# Patient Record
Sex: Female | Born: 1935 | Race: White | Hispanic: No | Smoking: Never smoker
Health system: Southern US, Community
[De-identification: ages and names within clinical notes are randomized; demographics above are authoritative.]

## PROBLEM LIST (undated history)

## (undated) DIAGNOSIS — Z860100 Personal history of colon polyps, unspecified: Secondary | ICD-10-CM

## (undated) DIAGNOSIS — H9190 Unspecified hearing loss, unspecified ear: Secondary | ICD-10-CM

## (undated) DIAGNOSIS — K2270 Barrett's esophagus without dysplasia: Secondary | ICD-10-CM

## (undated) DIAGNOSIS — K5792 Diverticulitis of intestine, part unspecified, without perforation or abscess without bleeding: Secondary | ICD-10-CM

## (undated) DIAGNOSIS — F419 Anxiety disorder, unspecified: Secondary | ICD-10-CM

## (undated) DIAGNOSIS — N390 Urinary tract infection, site not specified: Secondary | ICD-10-CM

## (undated) DIAGNOSIS — E785 Hyperlipidemia, unspecified: Secondary | ICD-10-CM

## (undated) DIAGNOSIS — E039 Hypothyroidism, unspecified: Secondary | ICD-10-CM

## (undated) DIAGNOSIS — M810 Age-related osteoporosis without current pathological fracture: Secondary | ICD-10-CM

## (undated) DIAGNOSIS — C4491 Basal cell carcinoma of skin, unspecified: Secondary | ICD-10-CM

## (undated) DIAGNOSIS — H532 Diplopia: Secondary | ICD-10-CM

## (undated) DIAGNOSIS — I10 Essential (primary) hypertension: Secondary | ICD-10-CM

## (undated) DIAGNOSIS — K449 Diaphragmatic hernia without obstruction or gangrene: Secondary | ICD-10-CM

## (undated) DIAGNOSIS — C73 Malignant neoplasm of thyroid gland: Secondary | ICD-10-CM

## (undated) HISTORY — DX: Anxiety disorder, unspecified: F41.9

## (undated) HISTORY — PX: CHOLECYSTECTOMY: SHX55

## (undated) HISTORY — DX: Hyperlipidemia, unspecified: E78.5

## (undated) HISTORY — DX: Diplopia: H53.2

## (undated) HISTORY — DX: Urinary tract infection, site not specified: N39.0

## (undated) HISTORY — DX: Diaphragmatic hernia without obstruction or gangrene: K44.9

## (undated) HISTORY — PX: ABDOMINAL HYSTERECTOMY: SHX81

## (undated) HISTORY — DX: Personal history of colonic polyps: Z86.010

## (undated) HISTORY — DX: Barrett's esophagus without dysplasia: K22.70

## (undated) HISTORY — DX: Diverticulitis of intestine, part unspecified, without perforation or abscess without bleeding: K57.92

## (undated) HISTORY — DX: Age-related osteoporosis without current pathological fracture: M81.0

## (undated) HISTORY — DX: Basal cell carcinoma of skin, unspecified: C44.91

## (undated) HISTORY — DX: Personal history of colon polyps, unspecified: Z86.0100

## (undated) HISTORY — DX: Hypothyroidism, unspecified: E03.9

## (undated) HISTORY — DX: Malignant neoplasm of thyroid gland: C73

## (undated) HISTORY — DX: Essential (primary) hypertension: I10

## (undated) HISTORY — PX: SKIN CANCER EXCISION: SHX779

---

## 1993-08-27 HISTORY — PX: HIP SURGERY: SHX245

## 1994-01-25 DIAGNOSIS — C73 Malignant neoplasm of thyroid gland: Secondary | ICD-10-CM

## 1994-01-25 HISTORY — DX: Malignant neoplasm of thyroid gland: C73

## 1994-02-25 DIAGNOSIS — Z85850 Personal history of malignant neoplasm of thyroid: Secondary | ICD-10-CM | POA: Insufficient documentation

## 1994-04-27 HISTORY — PX: THYROIDECTOMY: SHX17

## 1998-05-27 DIAGNOSIS — F329 Major depressive disorder, single episode, unspecified: Secondary | ICD-10-CM

## 1998-05-27 DIAGNOSIS — F32A Depression, unspecified: Secondary | ICD-10-CM | POA: Insufficient documentation

## 1998-06-08 ENCOUNTER — Ambulatory Visit (HOSPITAL_COMMUNITY): Admission: RE | Admit: 1998-06-08 | Discharge: 1998-06-08 | Payer: Self-pay | Admitting: Gastroenterology

## 1998-10-07 ENCOUNTER — Encounter: Payer: Self-pay | Admitting: Gastroenterology

## 1998-10-07 ENCOUNTER — Ambulatory Visit (HOSPITAL_COMMUNITY): Admission: RE | Admit: 1998-10-07 | Discharge: 1998-10-07 | Payer: Self-pay | Admitting: Gastroenterology

## 1998-10-27 ENCOUNTER — Ambulatory Visit (HOSPITAL_COMMUNITY): Admission: RE | Admit: 1998-10-27 | Discharge: 1998-10-27 | Payer: Self-pay | Admitting: Gastroenterology

## 2001-04-12 ENCOUNTER — Other Ambulatory Visit: Admission: RE | Admit: 2001-04-12 | Discharge: 2001-04-12 | Payer: Self-pay | Admitting: Family Medicine

## 2001-04-18 ENCOUNTER — Encounter: Admission: RE | Admit: 2001-04-18 | Discharge: 2001-04-18 | Payer: Self-pay | Admitting: Family Medicine

## 2001-04-18 ENCOUNTER — Encounter: Payer: Self-pay | Admitting: Family Medicine

## 2003-09-02 LAB — FECAL OCCULT BLOOD, GUAIAC: Fecal Occult Blood: NEGATIVE

## 2005-02-16 ENCOUNTER — Ambulatory Visit: Payer: Self-pay | Admitting: Family Medicine

## 2005-04-26 ENCOUNTER — Ambulatory Visit: Payer: Self-pay | Admitting: Family Medicine

## 2005-05-11 ENCOUNTER — Ambulatory Visit: Payer: Self-pay | Admitting: Family Medicine

## 2005-06-01 ENCOUNTER — Ambulatory Visit: Payer: Self-pay | Admitting: Family Medicine

## 2005-07-25 ENCOUNTER — Ambulatory Visit: Payer: Self-pay | Admitting: Family Medicine

## 2005-07-27 ENCOUNTER — Ambulatory Visit: Payer: Self-pay | Admitting: Family Medicine

## 2005-09-22 ENCOUNTER — Ambulatory Visit: Payer: Self-pay | Admitting: Family Medicine

## 2005-11-01 ENCOUNTER — Ambulatory Visit: Payer: Self-pay | Admitting: Gastroenterology

## 2005-11-14 ENCOUNTER — Ambulatory Visit: Payer: Self-pay | Admitting: Gastroenterology

## 2005-12-06 ENCOUNTER — Ambulatory Visit: Payer: Self-pay | Admitting: Gastroenterology

## 2005-12-06 ENCOUNTER — Ambulatory Visit: Payer: Self-pay | Admitting: Family Medicine

## 2005-12-18 ENCOUNTER — Ambulatory Visit: Payer: Self-pay | Admitting: Family Medicine

## 2005-12-20 ENCOUNTER — Encounter: Payer: Self-pay | Admitting: Orthopedic Surgery

## 2006-01-25 ENCOUNTER — Ambulatory Visit: Payer: Self-pay | Admitting: Family Medicine

## 2006-02-09 ENCOUNTER — Ambulatory Visit: Payer: Self-pay | Admitting: Psychiatry

## 2006-04-24 ENCOUNTER — Ambulatory Visit: Payer: Self-pay | Admitting: Family Medicine

## 2006-05-09 ENCOUNTER — Ambulatory Visit: Payer: Self-pay | Admitting: Family Medicine

## 2006-05-15 ENCOUNTER — Ambulatory Visit: Payer: Self-pay | Admitting: Gastroenterology

## 2006-05-28 ENCOUNTER — Ambulatory Visit: Payer: Self-pay | Admitting: Gastroenterology

## 2006-05-28 ENCOUNTER — Encounter: Payer: Self-pay | Admitting: Gastroenterology

## 2006-06-14 ENCOUNTER — Ambulatory Visit: Payer: Self-pay | Admitting: Family Medicine

## 2006-07-10 ENCOUNTER — Ambulatory Visit: Payer: Self-pay | Admitting: Gastroenterology

## 2006-08-21 ENCOUNTER — Ambulatory Visit: Payer: Self-pay | Admitting: Family Medicine

## 2006-08-21 LAB — CONVERTED CEMR LAB
Blood Glucose, Fasting: 94 mg/dL
TSH: 1.56 microintl units/mL

## 2006-08-23 ENCOUNTER — Other Ambulatory Visit: Admission: RE | Admit: 2006-08-23 | Discharge: 2006-08-23 | Payer: Self-pay | Admitting: Family Medicine

## 2006-08-23 ENCOUNTER — Ambulatory Visit: Payer: Self-pay | Admitting: Family Medicine

## 2006-08-23 ENCOUNTER — Encounter: Payer: Self-pay | Admitting: Family Medicine

## 2006-08-23 LAB — CONVERTED CEMR LAB: Pap Smear: NORMAL

## 2006-08-28 ENCOUNTER — Ambulatory Visit: Payer: Self-pay | Admitting: Family Medicine

## 2006-12-11 ENCOUNTER — Ambulatory Visit: Payer: Self-pay | Admitting: Urology

## 2006-12-11 ENCOUNTER — Other Ambulatory Visit: Payer: Self-pay

## 2006-12-17 ENCOUNTER — Ambulatory Visit: Payer: Self-pay | Admitting: Urology

## 2007-02-15 ENCOUNTER — Encounter: Payer: Self-pay | Admitting: Family Medicine

## 2007-02-15 DIAGNOSIS — I059 Rheumatic mitral valve disease, unspecified: Secondary | ICD-10-CM | POA: Insufficient documentation

## 2007-02-15 DIAGNOSIS — E039 Hypothyroidism, unspecified: Secondary | ICD-10-CM | POA: Insufficient documentation

## 2007-02-15 DIAGNOSIS — J309 Allergic rhinitis, unspecified: Secondary | ICD-10-CM | POA: Insufficient documentation

## 2007-02-15 DIAGNOSIS — N951 Menopausal and female climacteric states: Secondary | ICD-10-CM | POA: Insufficient documentation

## 2007-02-15 DIAGNOSIS — Z860100 Personal history of colon polyps, unspecified: Secondary | ICD-10-CM | POA: Insufficient documentation

## 2007-02-15 DIAGNOSIS — K589 Irritable bowel syndrome without diarrhea: Secondary | ICD-10-CM | POA: Insufficient documentation

## 2007-02-15 DIAGNOSIS — J329 Chronic sinusitis, unspecified: Secondary | ICD-10-CM | POA: Insufficient documentation

## 2007-02-15 DIAGNOSIS — R630 Anorexia: Secondary | ICD-10-CM | POA: Insufficient documentation

## 2007-02-15 DIAGNOSIS — K5730 Diverticulosis of large intestine without perforation or abscess without bleeding: Secondary | ICD-10-CM | POA: Insufficient documentation

## 2007-02-15 DIAGNOSIS — I10 Essential (primary) hypertension: Secondary | ICD-10-CM | POA: Insufficient documentation

## 2007-02-15 DIAGNOSIS — I4891 Unspecified atrial fibrillation: Secondary | ICD-10-CM | POA: Insufficient documentation

## 2007-02-15 DIAGNOSIS — E785 Hyperlipidemia, unspecified: Secondary | ICD-10-CM | POA: Insufficient documentation

## 2007-04-29 ENCOUNTER — Encounter: Payer: Self-pay | Admitting: Family Medicine

## 2007-05-15 ENCOUNTER — Ambulatory Visit: Payer: Self-pay | Admitting: Family Medicine

## 2007-05-15 DIAGNOSIS — M549 Dorsalgia, unspecified: Secondary | ICD-10-CM | POA: Insufficient documentation

## 2007-05-15 LAB — CONVERTED CEMR LAB
Bilirubin Urine: NEGATIVE
Glucose, Urine, Semiquant: NEGATIVE
Ketones, urine, test strip: NEGATIVE
Nitrite: NEGATIVE
Protein, U semiquant: NEGATIVE
Specific Gravity, Urine: <1.005
Urobilinogen, UA: 0.2
pH: 6

## 2007-05-16 ENCOUNTER — Telehealth: Payer: Self-pay | Admitting: Family Medicine

## 2007-06-11 ENCOUNTER — Encounter (INDEPENDENT_AMBULATORY_CARE_PROVIDER_SITE_OTHER): Payer: Self-pay | Admitting: Urology

## 2007-06-11 ENCOUNTER — Ambulatory Visit (HOSPITAL_BASED_OUTPATIENT_CLINIC_OR_DEPARTMENT_OTHER): Admission: RE | Admit: 2007-06-11 | Discharge: 2007-06-11 | Payer: Self-pay | Admitting: Urology

## 2007-09-03 ENCOUNTER — Encounter: Payer: Self-pay | Admitting: Family Medicine

## 2007-09-20 ENCOUNTER — Ambulatory Visit: Payer: Self-pay | Admitting: Family Medicine

## 2007-11-03 ENCOUNTER — Emergency Department: Payer: Self-pay | Admitting: Emergency Medicine

## 2007-11-03 ENCOUNTER — Encounter: Payer: Self-pay | Admitting: Family Medicine

## 2007-11-03 DIAGNOSIS — R1013 Epigastric pain: Secondary | ICD-10-CM | POA: Insufficient documentation

## 2007-11-18 ENCOUNTER — Ambulatory Visit: Payer: Self-pay | Admitting: Family Medicine

## 2007-11-18 DIAGNOSIS — D485 Neoplasm of uncertain behavior of skin: Secondary | ICD-10-CM | POA: Insufficient documentation

## 2007-12-04 ENCOUNTER — Encounter: Payer: Self-pay | Admitting: Family Medicine

## 2007-12-06 ENCOUNTER — Encounter: Payer: Self-pay | Admitting: Family Medicine

## 2008-09-07 ENCOUNTER — Ambulatory Visit: Payer: Self-pay | Admitting: Family Medicine

## 2008-09-09 ENCOUNTER — Ambulatory Visit (HOSPITAL_COMMUNITY): Admission: RE | Admit: 2008-09-09 | Discharge: 2008-09-09 | Payer: Self-pay | Admitting: Family Medicine

## 2008-09-09 LAB — HM MAMMOGRAPHY: HM Mammogram: NORMAL

## 2008-09-16 ENCOUNTER — Encounter (INDEPENDENT_AMBULATORY_CARE_PROVIDER_SITE_OTHER): Payer: Self-pay | Admitting: *Deleted

## 2008-10-30 ENCOUNTER — Telehealth (INDEPENDENT_AMBULATORY_CARE_PROVIDER_SITE_OTHER): Payer: Self-pay | Admitting: *Deleted

## 2008-12-04 ENCOUNTER — Telehealth: Payer: Self-pay | Admitting: Family Medicine

## 2009-01-05 ENCOUNTER — Ambulatory Visit: Payer: Self-pay | Admitting: Family Medicine

## 2009-01-13 ENCOUNTER — Telehealth: Payer: Self-pay | Admitting: Family Medicine

## 2009-01-14 ENCOUNTER — Ambulatory Visit: Payer: Self-pay | Admitting: Family Medicine

## 2009-01-18 ENCOUNTER — Encounter: Payer: Self-pay | Admitting: Family Medicine

## 2009-01-18 ENCOUNTER — Emergency Department (HOSPITAL_COMMUNITY): Admission: EM | Admit: 2009-01-18 | Discharge: 2009-01-18 | Payer: Self-pay | Admitting: Emergency Medicine

## 2009-01-19 ENCOUNTER — Ambulatory Visit: Payer: Self-pay | Admitting: Family Medicine

## 2009-01-19 DIAGNOSIS — K219 Gastro-esophageal reflux disease without esophagitis: Secondary | ICD-10-CM | POA: Insufficient documentation

## 2009-01-22 ENCOUNTER — Encounter: Payer: Self-pay | Admitting: Internal Medicine

## 2009-01-22 ENCOUNTER — Observation Stay (HOSPITAL_COMMUNITY): Admission: EM | Admit: 2009-01-22 | Discharge: 2009-01-23 | Payer: Self-pay | Admitting: Emergency Medicine

## 2009-01-22 ENCOUNTER — Encounter: Payer: Self-pay | Admitting: Family Medicine

## 2009-01-22 ENCOUNTER — Ambulatory Visit: Payer: Self-pay | Admitting: Internal Medicine

## 2009-01-22 DIAGNOSIS — R51 Headache: Secondary | ICD-10-CM | POA: Insufficient documentation

## 2009-01-22 DIAGNOSIS — R519 Headache, unspecified: Secondary | ICD-10-CM | POA: Insufficient documentation

## 2009-01-22 DIAGNOSIS — H519 Unspecified disorder of binocular movement: Secondary | ICD-10-CM | POA: Insufficient documentation

## 2009-01-28 ENCOUNTER — Telehealth: Payer: Self-pay | Admitting: Internal Medicine

## 2009-01-29 ENCOUNTER — Ambulatory Visit: Payer: Self-pay | Admitting: Family Medicine

## 2009-01-29 ENCOUNTER — Telehealth: Payer: Self-pay | Admitting: Family Medicine

## 2009-01-29 DIAGNOSIS — G527 Disorders of multiple cranial nerves: Secondary | ICD-10-CM | POA: Insufficient documentation

## 2009-01-31 ENCOUNTER — Inpatient Hospital Stay (HOSPITAL_COMMUNITY): Admission: EM | Admit: 2009-01-31 | Discharge: 2009-02-10 | Payer: Self-pay | Admitting: Emergency Medicine

## 2009-02-11 ENCOUNTER — Telehealth: Payer: Self-pay | Admitting: Family Medicine

## 2009-02-16 ENCOUNTER — Telehealth: Payer: Self-pay | Admitting: Family Medicine

## 2009-02-23 ENCOUNTER — Ambulatory Visit: Payer: Self-pay | Admitting: Family Medicine

## 2009-02-23 LAB — CONVERTED CEMR LAB
ALT: 16 units/L (ref 0–35)
AST: 22 units/L (ref 0–37)
Albumin: 3.3 g/dL — ABNORMAL LOW (ref 3.5–5.2)
Alkaline Phosphatase: 47 units/L (ref 39–117)
BUN: 8 mg/dL (ref 6–23)
Basophils Absolute: 0 10*3/uL (ref 0.0–0.1)
Basophils Relative: 0.1 % (ref 0.0–3.0)
Bilirubin Urine: NEGATIVE
Bilirubin, Direct: 0 mg/dL (ref 0.0–0.3)
Blood in Urine, dipstick: NEGATIVE
CO2: 34 meq/L — ABNORMAL HIGH (ref 19–32)
Calcium: 8.6 mg/dL (ref 8.4–10.5)
Chloride: 101 meq/L (ref 96–112)
Creatinine, Ser: 0.7 mg/dL (ref 0.4–1.2)
Eosinophils Absolute: 0.1 10*3/uL (ref 0.0–0.7)
Eosinophils Relative: 1.1 % (ref 0.0–5.0)
GFR calc non Af Amer: 86.34 mL/min (ref >60)
Glucose, Bld: 106 mg/dL — ABNORMAL HIGH (ref 70–99)
Glucose, Urine, Semiquant: NEGATIVE
HCT: 37.5 % (ref 36.0–46.0)
Hemoglobin: 13 g/dL (ref 12.0–15.0)
Ketones, urine, test strip: NEGATIVE
Lymphocytes Relative: 25.5 % (ref 12.0–46.0)
Lymphs Abs: 1.6 10*3/uL (ref 0.7–4.0)
MCHC: 34.6 g/dL (ref 30.0–36.0)
MCV: 97.6 fL (ref 78.0–100.0)
Monocytes Absolute: 0.5 10*3/uL (ref 0.1–1.0)
Monocytes Relative: 8.3 % (ref 3.0–12.0)
Neutro Abs: 3.9 10*3/uL (ref 1.4–7.7)
Neutrophils Relative %: 65 % (ref 43.0–77.0)
Nitrite: NEGATIVE
Platelets: 273 10*3/uL (ref 150.0–400.0)
Potassium: 3.6 meq/L (ref 3.5–5.1)
Protein, U semiquant: NEGATIVE
RBC: 3.85 M/uL — ABNORMAL LOW (ref 3.87–5.11)
RDW: 13.7 % (ref 11.5–14.6)
Sodium: 141 meq/L (ref 135–145)
Specific Gravity, Urine: <1.005
Total Bilirubin: 0.6 mg/dL (ref 0.3–1.2)
Total Protein: 6.5 g/dL (ref 6.0–8.3)
Urobilinogen, UA: 0.2
WBC Urine, dipstick: NEGATIVE
WBC: 6.1 10*3/uL (ref 4.5–10.5)
pH: 6

## 2009-02-25 ENCOUNTER — Telehealth: Payer: Self-pay | Admitting: Family Medicine

## 2009-03-09 ENCOUNTER — Encounter: Payer: Self-pay | Admitting: Family Medicine

## 2009-03-11 ENCOUNTER — Ambulatory Visit: Payer: Self-pay | Admitting: Family Medicine

## 2009-03-11 LAB — CONVERTED CEMR LAB
Bilirubin Urine: NEGATIVE
Blood in Urine, dipstick: NEGATIVE
Glucose, Urine, Semiquant: NEGATIVE
Ketones, urine, test strip: NEGATIVE
Nitrite: NEGATIVE
Protein, U semiquant: NEGATIVE
Specific Gravity, Urine: <1.005
Urobilinogen, UA: 0.2
WBC Urine, dipstick: NEGATIVE
pH: 6

## 2009-03-23 ENCOUNTER — Telehealth: Payer: Self-pay | Admitting: Family Medicine

## 2009-03-30 ENCOUNTER — Ambulatory Visit: Payer: Self-pay | Admitting: Family Medicine

## 2009-03-30 LAB — CONVERTED CEMR LAB
Bilirubin Urine: NEGATIVE
Glucose, Urine, Semiquant: NEGATIVE
Ketones, urine, test strip: NEGATIVE
Nitrite: NEGATIVE
Protein, U semiquant: 30
Specific Gravity, Urine: 1.01
Urobilinogen, UA: 0.2
pH: 6

## 2009-03-31 ENCOUNTER — Encounter: Payer: Self-pay | Admitting: Family Medicine

## 2009-04-07 ENCOUNTER — Encounter: Payer: Self-pay | Admitting: Family Medicine

## 2009-04-13 ENCOUNTER — Telehealth: Payer: Self-pay | Admitting: Family Medicine

## 2009-04-16 ENCOUNTER — Telehealth: Payer: Self-pay | Admitting: Family Medicine

## 2009-05-05 ENCOUNTER — Telehealth: Payer: Self-pay | Admitting: Family Medicine

## 2009-05-25 ENCOUNTER — Telehealth: Payer: Self-pay | Admitting: Family Medicine

## 2009-06-03 ENCOUNTER — Telehealth: Payer: Self-pay | Admitting: Family Medicine

## 2009-06-17 ENCOUNTER — Encounter: Payer: Self-pay | Admitting: Family Medicine

## 2009-07-05 ENCOUNTER — Telehealth: Payer: Self-pay | Admitting: Family Medicine

## 2009-08-04 ENCOUNTER — Telehealth: Payer: Self-pay | Admitting: Family Medicine

## 2009-08-25 ENCOUNTER — Ambulatory Visit: Payer: Self-pay | Admitting: Family Medicine

## 2009-09-03 ENCOUNTER — Telehealth: Payer: Self-pay | Admitting: Family Medicine

## 2009-09-13 ENCOUNTER — Telehealth: Payer: Self-pay | Admitting: Family Medicine

## 2009-09-30 ENCOUNTER — Telehealth: Payer: Self-pay | Admitting: Family Medicine

## 2009-10-07 ENCOUNTER — Telehealth: Payer: Self-pay | Admitting: Family Medicine

## 2009-10-18 ENCOUNTER — Telehealth: Payer: Self-pay | Admitting: Family Medicine

## 2009-10-28 ENCOUNTER — Ambulatory Visit: Payer: Self-pay | Admitting: Family Medicine

## 2009-10-29 ENCOUNTER — Telehealth: Payer: Self-pay | Admitting: Family Medicine

## 2009-12-09 ENCOUNTER — Encounter: Payer: Self-pay | Admitting: Family Medicine

## 2009-12-09 ENCOUNTER — Telehealth: Payer: Self-pay | Admitting: Family Medicine

## 2010-01-05 ENCOUNTER — Telehealth: Payer: Self-pay | Admitting: Family Medicine

## 2010-01-11 ENCOUNTER — Ambulatory Visit: Payer: Self-pay | Admitting: Family Medicine

## 2010-01-13 ENCOUNTER — Encounter: Payer: Self-pay | Admitting: Family Medicine

## 2010-01-17 ENCOUNTER — Telehealth: Payer: Self-pay | Admitting: Family Medicine

## 2010-02-14 ENCOUNTER — Telehealth: Payer: Self-pay | Admitting: Internal Medicine

## 2010-03-01 ENCOUNTER — Ambulatory Visit: Payer: Self-pay | Admitting: Family Medicine

## 2010-06-29 ENCOUNTER — Encounter (INDEPENDENT_AMBULATORY_CARE_PROVIDER_SITE_OTHER): Payer: Self-pay | Admitting: *Deleted

## 2010-08-16 ENCOUNTER — Ambulatory Visit: Payer: Self-pay | Admitting: Family Medicine

## 2010-08-16 DIAGNOSIS — J029 Acute pharyngitis, unspecified: Secondary | ICD-10-CM | POA: Insufficient documentation

## 2010-08-16 LAB — CONVERTED CEMR LAB: Rapid Strep: NEGATIVE

## 2010-09-09 ENCOUNTER — Telehealth: Payer: Self-pay | Admitting: Family Medicine

## 2010-09-22 ENCOUNTER — Encounter: Payer: Self-pay | Admitting: Family Medicine

## 2010-09-29 ENCOUNTER — Encounter: Payer: Self-pay | Admitting: Family Medicine

## 2010-09-30 ENCOUNTER — Ambulatory Visit: Payer: Self-pay | Admitting: Family Medicine

## 2010-09-30 DIAGNOSIS — N390 Urinary tract infection, site not specified: Secondary | ICD-10-CM | POA: Insufficient documentation

## 2010-09-30 LAB — CONVERTED CEMR LAB
Bilirubin Urine: NEGATIVE
Glucose, Urine, Semiquant: NEGATIVE
Ketones, urine, test strip: NEGATIVE
Nitrite: NEGATIVE
Protein, U semiquant: NEGATIVE
Specific Gravity, Urine: <1.005
Urobilinogen, UA: 0.2
pH: 6

## 2010-10-01 ENCOUNTER — Encounter: Payer: Self-pay | Admitting: Family Medicine

## 2010-10-17 ENCOUNTER — Telehealth: Payer: Self-pay | Admitting: Family Medicine

## 2010-10-28 ENCOUNTER — Telehealth: Payer: Self-pay | Admitting: Family Medicine

## 2010-12-29 NOTE — Miscellaneous (Signed)
Summary: Flu vaccine   Clinical Lists Changes  Observations: Added new observation of FLU VAX: Historical (09/27/2010 11:04)      Influenza Immunization History:    Influenza # 1:  Historical (09/27/2010) Received form from [facility] [place], [place].

## 2010-12-29 NOTE — Assessment & Plan Note (Signed)
Summary: DISCUSS 01/15/10 LETTER FROM [facility]   Vital Signs:  Patient profile:   77 year old female Weight:      125.50 pounds Temp:     97.9 degrees F oral Pulse rate:   80 / minute Pulse rhythm:   regular BP sitting:   118 / 78  (left arm) Cuff size:   regular  Vitals Entered By: Sydell Axon LPN (March 02, 2955 11:54 AM) CC: Discuss letter from [employer] regarding Aciphex, taking Prilosec which does not work as well   History of Present Illness: Pt here to discuss PPI therapy. [facility] sent ltr to me addressed to the pt that she says she has not seen or recieved telling her they will not cover Aciphex which gives her total control of her GERD sxs. They will cover Omeprazole tier 1 and Nexium tier 2. She has been on Nexium in the paast and can "get by with that", she has never been on Omeprazole. She has been on Prevacid in the past but prefers Aciphex. She has no other probs other than needing refills.  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) 5)  Herpes Zoster, Left Temp[oral Area  (ICD-053.9) 6)  Gerd  (ICD-530.81) 7)  Uri  (ICD-465.9) 8)  Cervical Lymphadenopathy, Left  (ICD-785.6) 9)  Keratoacanthoma  (ICD-238.2) 10)  Abdominal Pain, Epigastric  (ICD-789.06) 11)  Interstitial Cystitis (DR EVANS)  (ICD-595.1) 12)  Back Pain, Chronic  (ICD-724.5) 13)  Disorder, Depressive Nec  (ICD-311) 14)  Thyroid Cancer, Hx of  (ICD-V10.87) 15)  Anorexia  (ICD-783.0) 16)  Irritable Bowel Syndrome  (ICD-564.1) 17)  Atrial Fibrillation, Paroxysmal  (ICD-427.31) 18)  Mitral Valve Prolapse  (ICD-424.0) 19)  Postmenopausal Status  (ICD-627.2) 20)  Colonic Polyps, Hx of  (ICD-V12.72) 21)  Allergic Rhinitis  (ICD-477.9) 22)  Sinusitis, Chronic  (ICD-473.9) 23)  Hypothyroidism  (ICD-244.9) 24)  Hypertension  (ICD-401.9) 25)  Hyperlipidemia   (ICD-272.4) 26)  Diverticulosis, Colon  (ICD-562.10)  Medications Prior to Update: 1)  Synthroid 88 Mcg  Tabs (Levothyroxine Sodium) .Marland Kitchen.. 1 Daily By Mouth 2)  [name] 2.5-5 Mg  Caps (Clidinium-Chlordiazepoxide) .Marland Kitchen.. 1 Tab By Mouth Two Times A Day As Needed 3)  Lisinopril 10 Mg  Tabs (Lisinopril) .... One Tab By Mouth At Night 4)  Cvs Allergy Relief 10 Mg Tbdp (Loratadine) .Marland Kitchen.. 1 Daily As Needed 5)  Centrum Silver  Tabs (Multiple Vitamins-Minerals) .Marland Kitchen.. 1 Daily By Mouth 6)  Prevacid 30 Mg Cpdr (Lansoprazole) .... Take One Tab 45 Mins Before Eating 7)  Remeron 15 Mg Tabs (Mirtazapine) .... One Tab By Mouth At Night 8)  Zostavax 21308 Unt/0.78ml Solr (Zoster Vaccine Live) .... Please Administer Immun. 9)  Vicodin 5-500 Mg Tabs (Hydrocodone-Acetaminophen) .... Take 1 Tab By Mouth Daily For Back Pain. 10)  Hydrocodone-Acetaminophen 2.5-500 Mg Tabs (Hydrocodone-Acetaminophen) .... Take 1 By Mouth Two Times A Day As Needed Pain  Allergies: 1)  ! Penicillin G Sodium 2)  ! Cortisone Acetate (Cortisone Acetate) 3)  ! * Phenergan 4)  ! Gabapentin (Gabapentin)  Family History: Father dec 74 Mother dec 93 Brother dec (John Apple) Pharyngeal Ca  Brother dec MI DM Brother A  Sister dec DM  Amputation Sister A  Social History: Occupation:[employer]ER [employer] Married Widow/Widower  09/13/2002 Occupation:  employed  Physical Exam  General:  Well-developed,well-nourished,in no acute distress; alert,appropriate  and cooperative throughout examination Head:  Normocephalic and atraumatic without obvious abnormalities. No apparent alopecia or balding. Eyes:  No corneal or conjunctival inflammation noted. EOMI. Perrla. Funduscopic exam benign, without hemorrhages, exudates or papilledema. Vision grossly normal. Ears:  External ear exam shows no significant lesions or deformities.  Otoscopic examination reveals clear canals, tympanic membranes are intact bilaterally without bulging,  retraction, inflammation or discharge. Hearing is grossly normal bilaterally. Nose:  External nasal examination shows no deformity or inflammation. Nasal mucosa are pink and moist without lesions or exudates. Mouth:  Oral mucosa and oropharynx without lesions or exudates.  Teeth in good repair. Neck:  No deformities, masses, or tenderness noted. Lungs:  Normal respiratory effort, chest expands symmetrically. Lungs are clear to auscultation, no crackles or wheezes. Heart:  Normal rate and regular rhythm. S1 and S2 normal without gallop, murmur, click, rub or other extra sounds.   Impression & Recommendations:  Problem # 1:  GERD (ICD-530.81) Assessment Unchanged  Change Aci[phex to Omeprazole as test per [facility] insurance demand.  Discussed lifestyle hygiene.  Her updated medication list for this problem includes:    [name] 2.5-5 Mg Caps (Clidinium-chlordiazepoxide) .Marland Kitchen... 1 tab by mouth two times a day as needed    Omeprazole 40 Mg Cpdr (Omeprazole) ..... One tab by mouth 45 mins before breakfast  Diagnostics Reviewed:  Discussed lifestyle modifications, diet, antacids/medications, and preventive measures. Handout provided.   Complete Medication List: 1)  Synthroid 88 Mcg Tabs (Levothyroxine sodium) .Marland Kitchen.. 1 daily by mouth 2)  [name] 2.5-5 Mg Caps (Clidinium-chlordiazepoxide) .Marland Kitchen.. 1 tab by mouth two times a day as needed 3)  Lisinopril 10 Mg Tabs (Lisinopril) .... One tab by mouth at night 4)  Cvs Allergy Relief 10 Mg Tbdp (Loratadine) .Marland Kitchen.. 1 daily as needed 5)  Centrum Silver Tabs (Multiple vitamins-minerals) .Marland Kitchen.. 1 daily by mouth 6)  Omeprazole 40 Mg Cpdr (Omeprazole) .... One tab by mouth 45 mins before breakfast 7)  Remeron 15 Mg Tabs (Mirtazapine) .... One tab by mouth at night 8)  Zostavax 95621 Unt/0.71ml Solr (Zoster vaccine live) .... Please administer immun. 9)  Hydrocodone-acetaminophen 2.5-500 Mg Tabs (Hydrocodone-acetaminophen) .... Take 1 by mouth two times a day as needed  pain Prescriptions: HYDROCODONE-ACETAMINOPHEN 2.5-500 MG TABS (HYDROCODONE-ACETAMINOPHEN) take 1 by mouth two times a day as needed pain  #60 x 5   Entered and Authorized by:   Shaune Leeks MD   Signed by:   Shaune Leeks MD on 03/01/2010   Method used:   Print then Give to Patient   RxID:   [id number] [name] 2.5-5 MG  CAPS (CLIDINIUM-CHLORDIAZEPOXIDE) 1 tab by mouth two times a day as needed  #60 x 5   Entered and Authorized by:   Shaune Leeks MD   Signed by:   Shaune Leeks MD on 03/01/2010   Method used:   Electronically to        [facility]* (retail)       [address]       [place], [place]  [address]       Ph: [phone]       Fax: [phone]   RxID:   [id number] REMERON 15 MG TABS (MIRTAZAPINE) one tab by mouth at night  #30 x 12   Entered and Authorized by:   Shaune Leeks MD   Signed by:   Shaune Leeks MD on 03/01/2010   Method used:   Electronically to        [facility]* (  retail)       [address]       [place], [place]  [address]       Ph: [phone]       Fax: [phone]   RxID:   [id number] LISINOPRIL 10 MG  TABS (LISINOPRIL) one tab by mouth at night  #30 x 12   Entered and Authorized by:   Shaune Leeks MD   Signed by:   Shaune Leeks MD on 03/01/2010   Method used:   Electronically to        [facility]* (retail)       [address]       [place], [place]  [address]       Ph: [phone]       Fax: [phone]   RxID:   [id number] OMEPRAZOLE 40 MG CPDR (OMEPRAZOLE) one tab by mouth 45 mins before breakfast  #30 x 12   Entered and Authorized by:   Shaune Leeks MD   Signed by:   Shaune Leeks MD on 03/01/2010   Method used:   Electronically to        [facility]* (retail)       [address]       [place], [place]  [address]       Ph: [phone]       Fax: [phone]   RxID:   [id number]   Current Allergies (reviewed today): !  PENICILLIN G SODIUM ! CORTISONE ACETATE (CORTISONE ACETATE) ! * PHENERGAN ! GABAPENTIN (GABAPENTIN)

## 2010-12-29 NOTE — Assessment & Plan Note (Signed)
Summary: F/U    D/C 02/20/09 /[facility]   Vital Signs:  Patient Profile:   77 Years Old Female Height:     64.5 inches (163.83 cm) Weight:      118 pounds Temp:     97.4 degrees F oral Pulse rate:   68 / minute Pulse rhythm:   regular BP sitting:   140 / 70  (left arm) Cuff size:   regular  Vitals Entered By: Providence Crosby (January 29, 2009 12:02 PM)                 Chief Complaint:  followup er ? shingles in head// dizzy and nausea// states took last prednisone today.  History of Present Illness: 77 year old female with follow-up for shingles and admission to [facility].  Feels nervous, taking a pain pill every four or five hours.  Taking q 4-5 hours.   the patient was admitted to the hospital on January 22, 2009, and subsequently was discharged on Feb. 27, 2010. I have reviewed her hospital discharge summary. she was admitted with headache and diplopia, nausea. She was seen in the hospital by Dr. Kelli Hope, and all subsequent neurological testing including MRI, MRA of the head and neck were all negative.  Neurology's opinion was that this represented a subacute left third nerve palsy.  the patient continues to have pain. She also continues to have third old palsy with loss of some of the range of motion in her ocular muscles. She is here today and has a drooping eyelid. She has continued to take her acyclovir. She is now finished her course of prednisone. She has not filled her prescription for Neurontin.  She is also taking Darvocet approximately every 4-5 hours.    Prior Medications Reviewed Using: Patient Recall  Current Allergies (reviewed today): ! PENICILLIN G SODIUM ! CORTISONE ACETATE (CORTISONE ACETATE)  Past Medical History:    Reviewed history from 02/15/2007 and no changes required:       Diverticulosis, colon       Hyperlipidemia       Hypertension       Hypothyroidism  Past Surgical History:    Reviewed history from 01/19/2009 and no  changes required:       COLOSCOPY POLYP REMOVAL (1995)       Choleycystectomy, 1991       Colonosc divertics, int hemms, 1995       CT ABD AND PELVIS        THYROID R NODULE EXCISION Carcinoma (01/1994)       Total thyroidectomy, 04/1994 due to thyroid cancer       EGD HH, 1998       PELVIC U/S- NORMAL (03/2001)       ECHO Myxomatous thickening Mitral valve Mild MVP Tr MR Tr TR 05/23/2002       DEXA T-2.6 NECK OF HIP 3.5       COLONOSCOPY BX NEG (Dr Jarold Motto) (05/28/2006)       EGD (Dr Jarold Motto) (05/28/2006)       Hip pinning R de to Fracture of hip, 08/1993       Hysterectomy       CT HeadNo acute intracranial abnmlty, sinuses clear 01/18/2009     Review of Systems      See HPI  General      Complains of fatigue, loss of appetite, and weakness.  Eyes      Complains of light sensitivity.  loss of CN 3 function, L eye  ENT      Denies postnasal drainage.  CV      Denies chest pain or discomfort.  Resp      Denies cough.  Neuro      Complains of headaches.      Denies inability to speak and memory loss.   Physical Exam  General:     alert.  thin, elderly female. Head:     normocephalic and atraumatic.   Nose:     no external deformity.   Lungs:     Normal respiratory effort, chest expands symmetrically. Lungs are clear to auscultation, no crackles or wheezes. Heart:     normal rate and regular rhythm.  no murmur, no gallop, and no JVD.   Abdomen:     soft and non-tender.  no distention, no guarding, and no rigidity.   Extremities:     No clubbing, cyanosis, edema, or deformity noted with normal full range of motion of all joints.   Neurologic:     alert & oriented X3 and gait normal.     Eye Exam  Ocular Motility     OD: normal right     OS: abnormal left.  when testing looking up and down, sideways, patient had decreased motion - particularly superiorly and medially. 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 Patient was confused about her Neurology follow-up and had not made an appointment.  Cont with Acyclovir, patching, advised starting Neurontin for pain. Cont with Darvocet.  Assisted with f/u with Dr. Thad Ranger.  >25 minutes spent in record review, discussion with patient, sister in law, and coordination to assist with appropriate f/u.  Orders: Neurology Referral (Neuro)   Problem # 2:  HEADACHE (ICD-784.0) cont pain meds.  Her updated medication list for this problem includes:    Darvocet-n 100 100-650 Mg Tabs (Propoxyphene n-apap) .Marland Kitchen..Marland Kitchen Two times a day prn   Complete Medication List: 1)  Synthroid 88 Mcg Tabs (Levothyroxine sodium) .Marland Kitchen.. 1 daily by mouth 2)  [name] 2.5-5 Mg Caps (Clidinium-chlordiazepoxide) .Marland Kitchen.. 1 tab by mouth two times a day as needed 3)  Darvocet-n 100 100-650 Mg Tabs (Propoxyphene n-apap) .... Two times a day prn 4)  Lisinopril 10 Mg Tabs (Lisinopril) .... One tab by mouth at night 5)  Cvs Allergy Relief 10 Mg Tbdp (Loratadine) .Marland Kitchen.. 1 daily as needed 6)  Centrum Silver Tabs (Multiple vitamins-minerals) .Marland Kitchen.. 1 daily by mouth 7)  Move Free Triple Strength  .... 1 daily by mouth 8)  Nexium 20 Mg Cpdr (Esomeprazole magnesium) .... One tab by mouth twice a day. 9)  Acyclovir 800 Mg Tabs (Acyclovir) .... One tab every four hours, five times a day for ten days   Patient Instructions: 1)  Referral Appointment Information 2)  Day/Date: 3)  Time: 4)  Place/MD: 5)  Address: 6)  Phone/Fax: 7)  Patient given appointment information. Information/Orders faxed/mailed.

## 2010-12-29 NOTE — Progress Notes (Signed)
Summary: refill request for darvocet  Phone Note Refill Request Message from:  Fax from Pharmacy  Refills Requested: Medication #1:  DARVOCET-N 100 100-650 MG  TABS two times a day PRN   Last Refilled: 09/04/2009 Faxed request from [facility].  Initial call taken by: Lowella Petties CMA,  October 07, 2009 12:41 PM  Follow-up for Phone Call        Medication phoned to pharmacy.  Follow-up by: Delilah Shan CMA (AAMA),  October 07, 2009 4:14 PM    Prescriptions: DARVOCET-N 100 100-650 MG  TABS (PROPOXYPHENE N-APAP) two times a day PRN  #45 x 2   Entered and Authorized by:   Shaune Leeks MD   Signed by:   Shaune Leeks MD on 10/07/2009   Method used:   Telephoned to ...       [facility]* (retail)       [address]       [place], [place]  [address]       Ph: [phone]       Fax: [phone]   RxID:   [id number]

## 2010-12-29 NOTE — Assessment & Plan Note (Signed)
Summary: FREQUENT URINATION/DLO   Vital Signs:  Patient profile:   77 year old female Height:      64 inches Weight:      129.75 pounds BMI:     22.35 Temp:     97.9 degrees F oral Pulse rate:   80 / minute Pulse rhythm:   regular BP sitting:   160 / 90  (left arm) Cuff size:   regular  Vitals Entered By: Delilah Shan CMA Malerie Eakins Dull) (September 30, 2010 11:13 AM) CC: Frequent urination , ? UTI   History of Present Illness: dysuria: frequency, burning, decrease in uop per void but not overall duration of symptoms:a few days abdominal pain: occ, but a/w constipation- common for patient.  fevers: no back pain:no new back pain vomiting:no other concerns: no  Allergies: 1)  ! Penicillin G Sodium 2)  ! Cortisone Acetate (Cortisone Acetate) 3)  ! * Phenergan 4)  ! Gabapentin (Gabapentin)  Review of Systems       See HPI.  Otherwise negative.    Physical Exam  General:  GEN: nad, alert and oriented HEENT: mucous membranes moist NECK: supple CV: rrr.  PULM: ctab, no inc wob ABD: soft, +bs, suprapubic area minimally tender, no rebound EXT: no edema SKIN: no acute rash BACK: no CVA pain    Impression & Recommendations:  Problem # 1:  UTI (ICD-599.0) Start antibiotics and check cx.  Contact with results and follow up as needed.  She agrees.  No sign of pyelo.   The following medications were removed from the medication list:    Zithromax 250 Mg Tabs (Azithromycin) .Marland Kitchen... 2 by mouth x1 day and then 1 by mouth qday for 4 days. Her updated medication list for this problem includes:    Septra Ds 800-160 Mg Tabs (Sulfamethoxazole-trimethoprim) .Marland Kitchen... 1 by mouth two times a day  Orders: Specimen Handling ([address]) T-Culture, Urine ([id number])  Complete Medication List: 1)  Synthroid 88 Mcg Tabs (Levothyroxine sodium) .Marland Kitchen.. 1 daily by mouth 2)  [name] 2.5-5 Mg Caps (Clidinium-chlordiazepoxide) .Marland Kitchen.. 1 tab by mouth two times a day as needed 3)  Lisinopril 10 Mg Tabs (Lisinopril)  .... One tab by mouth at night 4)  Cvs Allergy Relief 10 Mg Tbdp (Loratadine) .Marland Kitchen.. 1 daily as needed 5)  Omeprazole 40 Mg Cpdr (Omeprazole) .... One tab by mouth 45 mins before breakfast 6)  Remeron 15 Mg Tabs (Mirtazapine) .... One tab by mouth at night 7)  Hydrocodone-acetaminophen 2.5-500 Mg Tabs (Hydrocodone-acetaminophen) .... Take 1 by mouth two times a day as needed pain 8)  Benefiber Tabs (Wheat dextrin) .... Once daily 9)  Multivitamins Tabs (Multiple vitamin) .... Once daily 10)  Septra Ds 800-160 Mg Tabs (Sulfamethoxazole-trimethoprim) .Marland Kitchen.. 1 by mouth two times a day  Patient Instructions: 1)  We'll contact you with your lab report.  I sent the antibiotics over to [facility].  I would start it today.  Let us know if you aren't improving.  Take care.  Prescriptions: SEPTRA DS 800-160 MG TABS (SULFAMETHOXAZOLE-TRIMETHOPRIM) 1 by mouth two times a day  #6 x 0   Entered and Authorized by:   Crawford Givens MD   Signed by:   Crawford Givens MD on 09/30/2010   Method used:   Electronically to        [facility]* (retail)       [address]       [place], [place]  [address]       Ph: [phone]  Fax: [phone]   RxID:   [id number]    Orders Added: 1)  Specimen Handling [[address]] 2)  T-Culture, Urine [[id number]] 3)  Est. Patient Level III [[address]]    Current Allergies (reviewed today): ! PENICILLIN G SODIUM ! CORTISONE ACETATE (CORTISONE ACETATE) ! * PHENERGAN ! GABAPENTIN (GABAPENTIN)  Laboratory Results   Urine Tests  Date/Time Received: September 30, 2010 11:40 AM   Routine Urinalysis   Color: yellow Appearance: Cloudy Glucose: negative   (Normal Range: Negative) Bilirubin: negative   (Normal Range: Negative) Ketone: negative   (Normal Range: Negative) Spec. Gravity: <1.005   (Normal Range: 1.003-1.035) Blood: small   (Normal Range: Negative) pH: 6.0   (Normal Range: 5.0-8.0) Protein: negative   (Normal Range: Negative) Urobilinogen: 0.2   (Normal  Range: 0-1) Nitrite: negative   (Normal Range: Negative) Leukocyte Esterace: large   (Normal Range: Negative)

## 2010-12-29 NOTE — Progress Notes (Signed)
Summary: Rx Estrace  Phone Note From Pharmacy Call back at [phone]   Caller: [facility]* Call For: Dr. Hetty Ely  Summary of Call: Received a faxed refill request for Estrace 0.1mg /Gm vaginal cream.  This medication is not on med list and it doesn't look like it ever was on med list.  Please advise.  Patient does not have an upcoming appt.  Please advise. Initial call taken by: Linde Gillis CMA Duncan Dull),  September 09, 2010 3:04 PM  Follow-up for Phone Call        Pls let pt know she needs appt with Dr Para March or me...she hasn't had Comp Exam in long time. I don't think I have ever prescribed Estrace Cream. She should contact whoever had prescribed it. Follow-up by: Shaune Leeks MD,  September 12, 2010 7:24 PM  Additional Follow-up for Phone Call Additional follow up Details #1::        Patient notified as instructed by telephone. Was informed by patient that this was prescribed by her urologist and she will contact their office. Offered to schedule an  appt for patient which she declined stating that she will call back later and schedule. Additional Follow-up by: Sydell Axon LPN,  September 13, 2010 11:10 AM

## 2010-12-29 NOTE — Letter (Signed)
Summary: [facility]/Dr.  [facility]/Dr.  Thad Ranger   Imported By: Eleonore Chiquito 03/10/2009 10:11:17  _____________________________________________________________________  External Attachment:    Type:   Image     Comment:   External Document

## 2010-12-29 NOTE — Assessment & Plan Note (Signed)
Summary: Weakness in legs; ?UTI   Vital Signs:  Patient profile:   77 year old female Height:      64 inches Weight:      111 pounds Temp:     97.7 degrees F oral Pulse rate:   88 / minute Pulse rhythm:   regular BP sitting:   130 / 70  (left arm) Cuff size:   regular  Vitals Entered By: Providence Crosby (March 11, 2009 10:56 AM) CC: COMPLAINS OF WEAKNESS IN LEGS AND KNEES // NO APPETITIE // EPIGASTRIC PAIN AND BURNING/ AND DYSURIA   History of Present Illness: Pt here witrh her friend for continued problems since her discharge from the hospital. She continues to complain of weakness in the legs with poor appetitie. She forces herself to eat what she does but her friend says she "eats like a bird."  She relates weakness with walking across the floor and discomfort in the epigastric area with/after eating. She has knwn IBS and has also been on Nexiumfor a long time for presumed GERD. SHe had been on Aciphex and was forced to change to Nexium. She thinks the Aciphex worked much better....she actually says the Nexium bothers her stomach. She continues to have trouble with the left eyelid not elevating which in turn causes difficulty. She has seen Dr Thad Ranger who says there is no relationship of the eye and the stomach (nausea and upset) which I agree on a pathophysiologic level but I think the lack of depth perception and general ability to see clearly from only one eye can be affecting the upset nature of her stomach. That coupled with her GERD and also a significant degree of depression, has to be affecting her well being. All of this was discussed at length.  Allergies: 1)  ! Penicillin G Sodium 2)  ! Cortisone Acetate (Cortisone Acetate) 3)  ! * Phenergan 4)  ! Gabapentin (Gabapentin)  Past History:  Past Medical History:    Diverticulosis, colon    Hyperlipidemia    Hypertension    Hypothyroidism     (02/15/2007)  Past Surgical History:    COLOSCOPY POLYP REMOVAL (1995)  Choleycystectomy, 1991    Colonosc divertics, int hemms, 1995    CT ABD AND PELVIS     THYROID R NODULE EXCISION Carcinoma (01/1994)    Total thyroidectomy, 04/1994 due to thyroid cancer    EGD HH, 1998    PELVIC U/S- NORMAL (03/2001)    ECHO Myxomatous thickening Mitral valve Mild MVP Tr MR Tr TR 05/23/2002    DEXA T-2.6 NECK OF HIP 3.5    COLONOSCOPY BX NEG (Dr Jarold Motto) (05/28/2006)    EGD (Dr Jarold Motto) (05/28/2006)    Hip pinning R de to Fracture of hip, 08/1993    Hysterectomy    CT HeadNo acute intracranial abnmlty, sinuses clear 01/18/2009    HOSP H/A Diploplia 2/26-2/27/2010    MRI/MRA Brain/Head Nml 01/22/2009    HOSP Abd Pain Diverticulitis UTI CN III Palsy  3/7-3/17/2010    CT Scan Abd Mild Dz   CT Scan Pelvis  Diverticulitis Poss 01/31/2009    CT Scan Abd No Chg   CT Scan Pelvis Diverticulitis  02/02/2009 (02/10/2009)  Risk Factors:    Alcohol Use: N/A    >5 drinks/d w/in last 3 months: N/A    Caffeine Use: N/A    Diet: N/A    Exercise: N/A  Risk Factors:    Smoking Status: N/A    Packs/Day: N/A    Cigars/wk:  N/A    Pipe Use/wk: N/A    Cans of tobacco/wk: N/A    Passive Smoke Exposure: N/A  Physical Exam  General:  alert.  thin, elderly female, NAD but voices being tired. Head:  normocephalic and atraumatic.   Eyes:  pupils reactive to light.  left medial rectus palsy, upper lid totally drooped covering the orbit. Ears:  External ear exam shows no significant lesions or deformities.  Otoscopic examination reveals clear canals, tympanic membranes are intact bilaterally without bulging, retraction, inflammation or discharge. Hearing is grossly normal bilaterally. Nose:  External nasal examination shows no deformity or inflammation. Nasal mucosa are pink and moist without lesions or exudates. Mouth:  Oral mucosa and oropharynx without lesions or exudates.  Teeth in good repair. Neck:  No deformities, masses, or tenderness noted. Chest Wall:  No deformities, masses, or  tenderness noted. Lungs:  Normal respiratory effort, chest expands symmetrically. Lungs are clear to auscultation, no crackles or wheezes. Heart:  normal rate and regular rhythm.  no murmur, no gallop, and no JVD.   Abdomen:  soft and non-tender.  no distention, no guarding, and no rigidity.  Nml BS. No evidence of prior LLQ tenderness. Neurologic:  alert & oriented X3 and gait slightly dysmetric.   Psych:  Oriented X3, flat affect, and moderately anxious.     Impression & Recommendations:  Problem # 1:  DISORDER, DEPRESSIVE NEC (ICD-311) Assessment Deteriorated Discussed at length. Will try low dose with aim of helping appetite and sleep as well. Her updated medication list for this problem includes:    Remeron 15 Mg Tabs (Mirtazapine) ..... 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  Problem # 3:  HERPES ZOSTER, LEFT TEMP[ORAL AREA (ICD-053.9) Assessment: Improved Seems to be resolving.   Problem # 4:  GERD (ICD-530.81) Assessment: Deteriorated  Will change Nexium to Aciphex acutely. May need to see GI. Her updated medication list for this problem includes:    [name] 2.5-5 Mg Caps (Clidinium-chlordiazepoxide) .Marland Kitchen... 1 tab by mouth two times a day as needed    Aciphex 20 Mg Tbec (Rabeprazole sodium) ..... One tab 45 mins before eating.  Diagnostics Reviewed:  Discussed lifestyle modifications, diet, antacids/medications, and preventive measures.   Problem # 5:  ABDOMINAL PAIN, EPIGASTRIC (ICD-789.06) Assessment: Unchanged  Presumed GERD. Will follow.  Problem # 6:  ANOREXIA (ICD-783.0) Assessment: Unchanged Trial of Remeron. If no help, may try Megace.  Problem # 7:  HYPERTENSION (ICD-401.9) Assessment: Unchanged  Her updated medication list for this problem includes:    Lisinopril 10 Mg Tabs  (Lisinopril) ..... One tab by mouth at night  BP today: 130/70 Prior BP: 120/80 (02/23/2009)  Labs Reviewed: K+: 3.6 (02/23/2009) Creat: : 0.7 (02/23/2009)     Complete Medication List: 1)  Synthroid 88 Mcg Tabs (Levothyroxine sodium) .Marland Kitchen.. 1 daily by mouth 2)  [name] 2.5-5 Mg Caps (Clidinium-chlordiazepoxide) .Marland Kitchen.. 1 tab by mouth two times a day as needed 3)  Darvocet-n 100 100-650 Mg Tabs (Propoxyphene n-apap) .... Two times a day prn 4)  Lisinopril 10 Mg Tabs (Lisinopril) .... One tab by mouth at night 5)  Cvs Allergy Relief 10 Mg Tbdp (Loratadine) .Marland Kitchen.. 1 daily as needed 6)  Centrum Silver Tabs (Multiple vitamins-minerals) .Marland Kitchen.. 1 daily by mouth 7)  Aciphex 20 Mg Tbec (Rabeprazole sodium) .... One tab 45 mins before  eating. 8)  Remeron 15 Mg Tabs (Mirtazapine) .... One tab by mouth at night  Patient Instructions: 1)  RTC one mo. Prescriptions: LISINOPRIL 10 MG  TABS (LISINOPRIL) one tab by mouth at night  #30 x 12   Entered and Authorized by:   Shaune Leeks MD   Signed by:   Shaune Leeks MD on 03/11/2009   Method used:   Electronically to        [facility]* (retail)       [address]       [place], [place]  [address]       Ph: [phone]       Fax: [phone]   RxID:   [id number] REMERON 15 MG TABS (MIRTAZAPINE) one tab by mouth at night  #30 x 12   Entered and Authorized by:   Shaune Leeks MD   Signed by:   Shaune Leeks MD on 03/11/2009   Method used:   Electronically to        [facility]* (retail)       [address]       [place], [place]  [address]       Ph: [phone]       Fax: [phone]   RxID:   [id number] ACIPHEX 20 MG TBEC (RABEPRAZOLE SODIUM) one tab 45 mins before eating.  #30 x 12   Entered and Authorized by:   Shaune Leeks MD   Signed by:   Shaune Leeks MD on 03/11/2009   Method used:   Electronically to        [facility]* (retail)       [address]       [place], [place]   [address]       Ph: [phone]       Fax: [phone]   RxID:   [id number]   Laboratory Results   Urine Tests  Date/Time Recieved: March 11, 2009 12:46 PM Date/Time Reported: March 11, 2009 12:46 PM  Routine Urinalysis   Color: yellow Appearance: Clear Glucose: negative   (Normal Range: Negative) Bilirubin: negative   (Normal Range: Negative) Ketone: negative   (Normal Range: Negative) Spec. Gravity: <1.005   (Normal Range: 1.003-1.035) Blood: negative   (Normal Range: Negative) pH: 6.0   (Normal Range: 5.0-8.0) Protein: negative   (Normal Range: Negative) Urobilinogen: 0.2   (Normal Range: 0-1) Nitrite: negative   (Normal Range: Negative) Leukocyte Esterace: negative   (Normal Range: Negative)

## 2010-12-29 NOTE — Assessment & Plan Note (Signed)
Summary: ST,BODY ACHES/CLE   Vital Signs:  Patient profile:   77 year old female Height:      64 inches Weight:      129 pounds BMI:     22.22 Temp:     98.3 degrees F oral Pulse rate:   84 / minute Pulse rhythm:   regular BP sitting:   146 / 84  (left arm) Cuff size:   regular  Vitals Entered By: [name] CMA [name]) (August 16, 2010 3:34 PM) CC: ST, body aches   History of Present Illness: ST, "my throat feels raw. I feel weak and achy."  Started Friday/Saturday.  H/o spasms in throat after thyroid surgery.  Anytime patient has a cold, these happen more.  No dyspnea.  No FCNAV.  Ringing in ears isn't new but bilateral ear pain is new.  Stuffy nose.  Occ cough, minimal sputum.  H/o GERD.   Allergies: 1)  ! Penicillin G Sodium 2)  ! Cortisone Acetate (Cortisone Acetate) 3)  ! * Phenergan 4)  ! 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  Otherwise negative.  Physical  Exam  General:  GEN: nad, alert and oriented HEENT: mucous membranes moist, TM w/o erythema, nasal epithelium injected, OP with cobblestoning NECK: supple w/o LA, prev scar noted from thyroid surgery CV: rrr. PULM: ctab, no inc wob ABD: soft, +bs EXT: no edema    Impression & Recommendations:  Problem # 1:  SORE THROAT (ICD-462) I would treat supportively in meantime with rest and fluids.  Salt water gargles and lidocaine viscous as needed with routine cautions.  If not better after one week, would start zmax.  Pt given rx to hold.  she understood.  follow up as needed.  She agrees.  Her updated medication list for this problem includes:    Lidocaine Viscous 2 % Soln (Lidocaine hcl) .Marland KitchenMarland KitchenMarland KitchenMarland Kitchen 5 ml gargle and spit qid as needed for sore throat    Zithromax 250 Mg Tabs (Azithromycin) .Marland Kitchen... 2 by mouth x1 day and then 1 by mouth qday for 4 days.  Complete Medication List: 1)  Synthroid 88 Mcg Tabs (Levothyroxine sodium) .Marland Kitchen.. 1 daily by mouth 2)  [name] 2.5-5 Mg Caps (Clidinium-chlordiazepoxide) .Marland Kitchen.. 1 tab by mouth two times a day as needed 3)  Lisinopril 10 Mg Tabs (Lisinopril) .... One tab by mouth at night 4)  Cvs Allergy Relief 10 Mg Tbdp (Loratadine) .Marland Kitchen.. 1 daily as needed 5)  Centrum Silver Tabs (Multiple vitamins-minerals) .Marland Kitchen.. 1 daily by mouth 6)  Omeprazole 40 Mg Cpdr (Omeprazole) .... One tab by mouth 45 mins  before breakfast 7)  Remeron 15 Mg Tabs (Mirtazapine) .... One tab by mouth at night 8)  Hydrocodone-acetaminophen 2.5-500 Mg Tabs (Hydrocodone-acetaminophen) .... Take 1 by mouth two times a day as needed pain 9)  Lidocaine Viscous 2 % Soln (Lidocaine hcl) .... 5 ml gargle and spit qid as needed for sore throat 10)  Zithromax 250 Mg Tabs (Azithromycin) .... 2 by mouth x1 day and then 1 by mouth qday for 4 days.  Patient Instructions: 1)  Rest and drink plent of fluids.  Gargle with salt water and use the lidocaine for your sore throat.  I would start the antibiotics at the end  of the week if you aren't getting better.   Prescriptions: ZITHROMAX 250 MG TABS (AZITHROMYCIN) 2 by mouth x1 day and then 1 by mouth qday for 4 days.  #6 x 0   Entered and Authorized by:   Kristin Givens MD   Signed by:   Kristin Givens MD on 08/16/2010   Method used:   Print then Give to Patient   RxID:   [id number] LIDOCAINE VISCOUS 2 % SOLN (LIDOCAINE HCL) 5 ml gargle and spit qid as needed for sore throat  #100 ml x 1   Entered and Authorized by:   Kristin Givens MD   Signed by:   Kristin Givens MD on 08/16/2010   Method used:   Electronically to        [facility]* (retail)       [address]       [place], [place]  [address]       Ph: [phone]       Fax: [phone]   RxID:   [id number]   Current Allergies (reviewed today): ! PENICILLIN G SODIUM ! CORTISONE ACETATE (CORTISONE ACETATE) ! * PHENERGAN ! GABAPENTIN (GABAPENTIN)  Laboratory Results  Date/Time Received: August 16, 2010 4:08 PM   Other Tests  Rapid Strep: negative

## 2010-12-29 NOTE — Progress Notes (Signed)
Summary: has yeast infection  Phone Note Call from Patient Call back at Home Phone [phone]   Caller: Patient Summary of Call: Pt has been taking abx and thinks she is getting a yeast infection, asks if diflucan can be called to [facility]. Initial call taken by: Lowella Petties,  Apr 16, 2009 11:49 AM  Follow-up for Phone Call        Patient Advised.  Follow-up by: Delilah Shan,  Apr 16, 2009 12:16 PM    New/Updated Medications: FLUCONAZOLE 150 MG TABS (FLUCONAZOLE) 1 tab by mouth x 1 day..take after antibiotics completed.   Prescriptions: FLUCONAZOLE 150 MG TABS (FLUCONAZOLE) 1 tab by mouth x 1 day..take after antibiotics completed.  #1 x 0   Entered and Authorized by:   Kerby Nora MD   Signed by:   Kerby Nora MD on 04/16/2009   Method used:   Electronically to        [facility]* (retail)       [address]       [place], [place]  [address]       Ph: [phone]       Fax: [phone]   RxID:   [id number]

## 2010-12-29 NOTE — Letter (Signed)
Summary: Results Follow up Letter  [place] [facility]  [address] [place], [place] [address]   Phone: [phone]  Fax: [phone]    09/16/2008 MRN: [id number]  [facility] [address] [place], [place]  [address]  Dear [name],  The following are the results of your recent test(s):  Test         Result    Pap Smear:        Normal _____  Not Normal _____ Comments: ______________________________________________________ Cholesterol: LDL(Bad cholesterol):         Your goal is less than:         HDL (Good cholesterol):       Your goal is more than: Comments:  ______________________________________________________ Mammogram:        Normal __X___  Not Normal _____ Comments:    YOUR MAMMOGRAM REPORT WAS NORMAL. PLEASE MAKE SURE TO REPEAT IN ONE YEAR. ENCLOSED IS A COPY OF YOUR REPORT.  ___________________________________________________________________ Hemoccult:        Normal _____  Not normal _______ Comments:    _____________________________________________________________________ Other Tests:    We routinely do not discuss normal results over the telephone.  If you desire a copy of the results, or you have any questions about this information we can discuss them at your next office visit.   Sincerely,

## 2010-12-29 NOTE — Assessment & Plan Note (Signed)
Summary: history and physical   Vital Signs:  Patient Profile:   77 Years Old Female Height:     64.5 inches (163.83 cm) O2 Sat:      99 % Temp:     98.3 degrees F Pulse rate:   84 / minute Pulse rhythm:   regular Resp:     16 per minute BP sitting:   135 / 65  Pt. in pain?   no                  Chief Complaint:  double vision follow a dsevere head ache.  History of Present Illness: pt is a 77 yo white female with double vision for 2 days. She states that she had a severe HA on the right temporal area 2 days ago that she describes as knive like. The resolved and was followed by double vision. The head ache has lessened but persisted and now involves the top of the head. Her past medical hx is significant for HTN, hypperlipidemia abd anxiety with depression. She is admitted for 23 hour observation and an MRA with neurology consult.    Current Allergies (reviewed today): ! PENICILLIN G SODIUM ! CORTISONE ACETATE (CORTISONE ACETATE)  Past Medical History:    Reviewed history from 02/15/2007 and no changes required:       Diverticulosis, colon       Hyperlipidemia       Hypertension       Hypothyroidism  Past Surgical History:    Reviewed history from 01/19/2009 and no changes required:       COLOSCOPY POLYP REMOVAL (1995)       Choleycystectomy, 1991       Colonosc divertics, int hemms, 1995       CT ABD AND PELVIS        THYROID R NODULE EXCISION Carcinoma (01/1994)       Total thyroidectomy, 04/1994 due to thyroid cancer       EGD HH, 1998       PELVIC U/S- NORMAL (03/2001)       ECHO Myxomatous thickening Mitral valve Mild MVP Tr 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 TR 05/23/2002       DEXA T-2.6 NECK OF HIP 3.5       COLONOSCOPY BX NEG (Dr Jarold Motto) (05/28/2006)       EGD (Dr Jarold Motto) (05/28/2006)       Hip pinning R de to Fracture of hip, 08/1993       Hysterectomy       CT HeadNo acute intracranial abnmlty, sinuses clear 01/18/2009   Social History:    Reviewed history and no  changes required:   Risk Factors:  Colonoscopy History:    Date of Last Colonoscopy:  05/28/2006  Mammogram History:    Date of Last Mammogram:  09/09/2008  @  [facility]  PAP Smear History:    Date of Last PAP Smear:  08/23/2006   Review of Systems       The patient complains of vision loss and headaches.  The patient denies anorexia, fever, weight loss, weight gain, decreased hearing, hoarseness, chest pain, syncope, dyspnea on exertion, peripheral edema, prolonged cough, hemoptysis, abdominal pain, melena, hematochezia, severe indigestion/heartburn, hematuria, incontinence, genital sores, muscle weakness, suspicious skin lesions, transient blindness, difficulty walking, depression, unusual weight change, abnormal bleeding, enlarged lymph nodes, angioedema, and breast masses.     Physical Exam  General:     Well-developed,well-nourished,in no acute distress; alert,appropriate and cooperative throughout examination Head:  normocephalic and atraumatic.   Eyes:     pupils reactive to light.  left medial rectus palsy Ears:     R ear normal and L ear normal.   Nose:     no external deformity and no nasal discharge.   Mouth:     pharynx pink and moist and no erythema.   Neck:     No deformities, masses, or tenderness noted. Chest Wall:     No deformities, masses, or tenderness noted. Lungs:     Normal respiratory effort, chest expands symmetrically. Lungs are clear to auscultation, no crackles or wheezes. Heart:     normal rate and regular rhythm.   Abdomen:     soft and non-tender.   Msk:     normal ROM and no joint warmth.   Pulses:     R and L carotid,radial,femoral,dorsalis pedis and posterior tibial pulses are full and equal bilaterally Extremities:     No clubbing, cyanosis, edema, or deformity noted with normal full range of motion of all joints.   Neurologic:     alert & oriented X3, strength normal in all extremities, and finger-to-nose normal.   Skin:     Intact  without suspicious lesions or rashes Cervical Nodes:     No lymphadenopathy noted Axillary Nodes:     No palpable lymphadenopathy Psych:     Oriented X3, flat affect, and moderately anxious.      Impression & Recommendations:  Problem # 1:  HERPES ZOSTER, LEFT TEMP[ORAL AREA (ICD-053.9) Assessment: Improved recent hx of herpes , this could be related to the HA and the paralysis, presumptively begin IV acyclovir Orders: No Charge Patient Arrived (NCPA0) (NCPA0)   Problem # 2:  CONVERGENCE INSUFF/PALSY BINOCULAR EYE MOVEMENT (ICD-378.83) left eye palsy , MRA pending presumptive therapy for viral eitiology, no other focal neurological signs to sugest TIA or CVA so will not fiully anticoagular Neurology to consult Orders: No Charge Patient Arrived (NCPA0) (NCPA0)   Problem # 3:  THYROID CANCER, HX OF (ICD-V10.87) this is a worisom fact for possible metastatic dx but the MRI initial reading did not suspect mets. WIll closely look at the MRA given this hx Orders: No Charge Patient Arrived (NCPA0) (NCPA0)   Complete Medication List: 1)  Synthroid 88 Mcg Tabs (Levothyroxine sodium) .Marland Kitchen.. 1 daily by mouth 2)  [name] 2.5-5 Mg Caps (Clidinium-chlordiazepoxide) .Marland Kitchen.. 1 tab by mouth two times a day as needed 3)  Darvocet-n 100 100-650 Mg Tabs (Propoxyphene n-apap) .... Two times a day prn 4)  Lisinopril 10 Mg Tabs (Lisinopril) .... One tab by mouth at night 5)  Cvs Allergy Relief 10 Mg Tbdp (Loratadine) .Marland Kitchen.. 1 daily as needed 6)  Centrum Silver Tabs (Multiple vitamins-minerals) .Marland Kitchen.. 1 daily by mouth 7)  Move Free Triple Strength  .... 1 daily by mouth 8)  Nexium 20 Mg Cpdr (Esomeprazole magnesium) .... One tab by mouth twice a day. 9)  Acyclovir 800 Mg Tabs (Acyclovir) .... One tab every four hours, five times a day for ten days   Patient Instructions: 1)  admit

## 2010-12-29 NOTE — Progress Notes (Signed)
Summary: librax refill for dr Lyndel Pleasure patient  Phone Note Refill Request Message from:  Fax from Pharmacy on January 29, 2009 11:36 AM  Refills Requested: Medication #1:  [name] 2.5-5 MG  CAPS 1 tab by mouth two times a day as needed   Last Refilled: 11/12/2008 [facility] [phone]  Initial call taken by: Providence Crosby,  January 29, 2009 11:36 AM  Follow-up for Phone Call        ok, 1 month supply Follow-up by: Hannah Beat MD,  January 29, 2009 11:46 AM  Additional Follow-up for Phone Call Additional follow up Details #1::        prescribtion called to pharmacy Additional Follow-up by: Providence Crosby,  January 29, 2009 12:06 PM

## 2010-12-29 NOTE — Assessment & Plan Note (Signed)
Summary: REFILL MEDS,FLU SHOT/CLE   Vital Signs:  Patient Profile:   77 Years Old Female Height:     64.5 inches (163.83 cm) Weight:      120 pounds Temp:     98 degrees F oral Pulse rate:   60 / minute Pulse rhythm:   regular BP sitting:   140 / 80  (left arm) Cuff size:   regular  Vitals Entered By: Providence Crosby (September 07, 2008 11:47 AM)                 Chief Complaint:  refill medications// right ear pain //ringing in ears.  History of Present Illness: Pt here for med refills...has ringing in the ears and wonders about a new herb medicine advertised in the paper. Her librax has jumped up in price.    Prior Medications Reviewed Using: List Brought by Patient  Current Allergies (reviewed today): ! PENICILLIN G SODIUM ! CORTISONE ACETATE (CORTISONE ACETATE)      Physical Exam  General:     Well-developed,well-nourished,in no acute distress; alert,appropriate and cooperative throughout examination Head:     Normocephalic and atraumatic without obvious abnormalities. No apparent alopecia or balding. Eyes:     Conjunctiva clear bilaterally.  Ears:     External ear exam shows no significant lesions or deformities.  Otoscopic examination reveals clear canals, tympanic membranes are intact bilaterally without bulging, retraction, inflammation or discharge. Hearing is grossly normal bilaterally. Nose:     External nasal examination shows no deformity or inflammation. Nasal mucosa are pink and moist without lesions or exudates. Mouth:     Oral mucosa and oropharynx without lesions or exudates.  Teeth in good repair. Neck:     No deformities, masses, or tenderness noted. Chest Wall:     No deformities, masses, or tenderness noted. Lungs:     Normal respiratory effort, chest expands symmetrically. Lungs are clear to auscultation, no crackles or wheezes. Heart:     Normal rate and regular rhythm. S1 and S2 normal without gallop, murmur, click, rub or other extra  sounds.    Impression & Recommendations:  Problem # 1:  BACK PAIN, CHRONIC (ICD-724.5) Assessment: Unchanged Stable. Her updated medication list for this problem includes:    Darvocet-n 100 100-650 Mg Tabs (Propoxyphene n-apap) .Marland Kitchen..Marland Kitchen Two times a day prn   Problem # 2:  DISORDER, DEPRESSIVE NEC (ICD-311) Assessment: Improved  Problem # 3:  HYPOTHYROIDISM (ICD-244.9) Assessment: Unchanged Mqads renewed, needs lab check...to be scheduled. Her updated medication list for this problem includes:    Synthroid 88 Mcg Tabs (Levothyroxine sodium) .Marland Kitchen... 1 qd  Labs Reviewed: TSH: 1.56 (08/21/2006)      Problem # 4:  HYPERTENSION (ICD-401.9) Assessment: Deteriorated Will recheck with next appt. Her updated medication list for this problem includes:    Lisinopril 10 Mg Tabs (Lisinopril) ..... One tab by mouth at night  BP today: 140/80 Prior BP: 100/60 (11/18/2007)   Problem # 5:  SINUSITIS, CHRONIC (ICD-473.9) Congestion ...try Mucous Relief. The following medications were removed from the medication list:    Bactrim Ds 800-160 Mg Tabs (Sulfamethoxazole-trimethoprim) ..... One tab by mouth two times a day   Problem # 6:  HYPERLIPIDEMIA (ICD-272.4) Assessment: Unchanged Needs recheck which I'll do next visit.  Complete Medication List: 1)  Aciphex 20 Mg Tbec (Rabeprazole sodium) .Marland Kitchen.. 1 bid 2)  Synthroid 88 Mcg Tabs (Levothyroxine sodium) .Marland Kitchen.. 1 qd 3)  [name] 2.5-5 Mg Caps (Clidinium-chlordiazepoxide) .Marland Kitchen.. 1 tab by mouth two times a day  as needed 4)  Darvocet-n 100 100-650 Mg Tabs (Propoxyphene n-apap) .... Two times a day prn 5)  Lisinopril 10 Mg Tabs (Lisinopril) .... One tab by mouth at night 6)  Cvs Allergy Relief 10 Mg Tbdp (Loratadine) .Marland Kitchen.. 1 daily as needed 7)  Centrum Silver Tabs (Multiple vitamins-minerals) .Marland Kitchen.. 1 daily by mouth 8)  Move Free Triple Strength  .... 1 daily by mouth   Patient Instructions: 1)  Please schedule for Comp Exam when able, labs prior. 2)   Take Guaifenesin by [facility] or [facility] and getting MUCOUS RELIEF EXPECTORANT (400mg ), take 11/2 tabs by mouth AM and NOON. 3)  Drink lots of fluids anytime taking Guaifenesin.    Prescriptions: LISINOPRIL 10 MG  TABS (LISINOPRIL) one tab by mouth at night  #30 x 12   Entered and Authorized by:   Shaune Leeks MD   Signed by:   Shaune Leeks MD on 09/07/2008   Method used:   Print then Give to Patient   RxID:   [id number] DARVOCET-N 100 100-650 MG  TABS (PROPOXYPHENE N-APAP) two times a day PRN  #45 x 5   Entered and Authorized by:   Shaune Leeks MD   Signed by:   Shaune Leeks MD on 09/07/2008   Method used:   Print then Give to Patient   RxID:   [id number] [name] 2.5-5 MG  CAPS (CLIDINIUM-CHLORDIAZEPOXIDE) 1 tab by mouth two times a day as needed  #60 x 5   Entered and Authorized by:   Shaune Leeks MD   Signed by:   Shaune Leeks MD on 09/07/2008   Method used:   Print then Give to Patient   RxID:   [id number] SYNTHROID 88 MCG  TABS (LEVOTHYROXINE SODIUM) 1 QD  #30 x 12   Entered and Authorized by:   Shaune Leeks MD   Signed by:   Shaune Leeks MD on 09/07/2008   Method used:   Print then Give to Patient   RxID:   [id number] ACIPHEX 20 MG  TBEC (RABEPRAZOLE SODIUM) 1 BID  #60 x 12   Entered and Authorized by:   Shaune Leeks MD   Signed by:   Shaune Leeks MD on 09/07/2008   Method used:   Print then Give to Patient   RxID:   [id number]  ]

## 2010-12-29 NOTE — Progress Notes (Signed)
Summary: Medication is making her nauseated  Medications Added BACTRIM DS 800-160 MG  TABS (SULFAMETHOXAZOLE-TRIMETHOPRIM) one tab by mouth two times a day       Phone Note Call from Patient   Caller: Patient Call For: Dr. Hetty Ely Summary of Call: Was seen yesterday and give Cipro for her kidneys.  Medicine is making her sick on her stomach, will you call something else in for her.  [facility] Initial call taken by: Sydell Axon,  May 16, 2007 11:32 AM  Follow-up for Phone Call        Rx called to pharmacy.  Pt advised. Follow-up by: Sydell Axon,  May 16, 2007 5:39 PM  New/Updated Medications: BACTRIM DS 800-160 MG  TABS (SULFAMETHOXAZOLE-TRIMETHOPRIM) one tab by mouth two times a day  New/Updated Medications: BACTRIM DS 800-160 MG  TABS (SULFAMETHOXAZOLE-TRIMETHOPRIM) one tab by mouth two times a day  Prescriptions: BACTRIM DS 800-160 MG  TABS (SULFAMETHOXAZOLE-TRIMETHOPRIM) one tab by mouth two times a day  #20 x 0   Entered and Authorized by:   Shaune Leeks MD   Signed by:   Shaune Leeks MD on 05/16/2007   Method used:   Print then Give to Patient   RxID:   [id number]

## 2010-12-29 NOTE — Assessment & Plan Note (Signed)
Summary: FLU SHOT/CLE   Nurse Visit    Prior Medications: ACIPHEX 20 MG  TBEC (RABEPRAZOLE SODIUM) 1 BID SYNTHROID 88 MCG  TABS (LEVOTHYROXINE SODIUM) 1 QD ARTHROTEC 75   TABS (DICLOFENAC-MISOPROSTOL TABS) two times a day PRN [name] 2.5-5 MG  CAPS (CLIDINIUM-CHLORDIAZEPOXIDE) 1 tab by mouth two times a day as needed CLARITIN 10 MG  TABS (LORATADINE) as needed AS NEEDED DARVOCET-N 100 100-650 MG  TABS (PROPOXYPHENE N-APAP) two times a day PRN LISINOPRIL 10 MG  TABS (LISINOPRIL) one tab by mouth at night BACTRIM DS 800-160 MG  TABS (SULFAMETHOXAZOLE-TRIMETHOPRIM) one tab by mouth two times a day Current Allergies: ! PENICILLIN G SODIUM ! CORTISONE ACETATE (CORTISONE ACETATE)    Orders Added: 1)  Flu Vaccine 45yrs + [90658] 2)  Admin 1st Vaccine Mishka.Peer    ]  Influenza Vaccine    Vaccine Type: Fluvax 3+    Site: left deltoid    Mfr: Evans    Dose: 0.5 ml    Route: IM    Given by: Providence Crosby    Exp. Date: 05/26/2008    Lot #: [id number]    VIS given: 05/26/05 version given September 20, 2007.  Flu Vaccine Consent Questions    Do you have a history of severe allergic reactions to this vaccine? no    Any prior history of allergic reactions to egg and/or gelatin? no    Do you have a sensitivity to the preservative Thimersol? no    Do you have a past history of Guillan-Barre Syndrome? no    Do you currently have an acute febrile illness? no    Have you ever had a severe reaction to latex? no    Vaccine information given and explained to patient? yes    Are you currently pregnant? no

## 2010-12-29 NOTE — Medication Information (Signed)
Summary: [facility] Prior Auth Denial for Aciphex  [facility] Prior Auth Denial for Aciphex   Imported By: Beau Fanny 12/15/2009 10:58:54  _____________________________________________________________________  External Attachment:    Type:   Image     Comment:   External Document

## 2010-12-29 NOTE — Progress Notes (Signed)
Summary: refill darvocet  Phone Note Refill Request Message from:  Fax from Pharmacy on June 03, 2009 12:03 PM  Refills Requested: Medication #1:  DARVOCET-N 100 100-650 MG  TABS two times a day PRN   Last Refilled: 05/05/2009 [facility]  [phone]  Initial call taken by: Providence Crosby LPN,  June 03, 8656 12:04 PM  Follow-up for Phone Call        lmom for refill  Follow-up by: Providence Crosby LPN,  June 03, 8468 2:03 PM      Prescriptions: DARVOCET-N 100 100-650 MG  TABS (PROPOXYPHENE N-APAP) two times a day PRN  #45 x 0   Entered and Authorized by:   Shaune Leeks MD   Signed by:   Shaune Leeks MD on 06/03/2009   Method used:   Telephoned to ...       [facility]* (retail)       [address]       [place], [place]  [address]       Ph: [phone]       Fax: [phone]   RxID:   [id number]

## 2010-12-29 NOTE — Miscellaneous (Signed)
Summary: Zostavax   Clinical Lists Changes  Observations: Added new observation of ZOSTAVAX: Zostavax (09/13/2010 13:39)      Other Immunization History:    Zostavax # 1:  Zostavax (09/13/2010)

## 2010-12-29 NOTE — Progress Notes (Signed)
Summary: needs new script for vicodin  Phone Note Refill Request Call back at Home Phone [phone] Message from:  Patient  Refills Requested: Medication #1:  HYDROCODONE-ACETAMINOPHEN 2.5-500 MG TABS one tab by mouth daily for back pain as required.. Pt states this dose is not strong enough to control her back pain.  Some days she has to take 2.  She is asking for a new script stating she can take 2 a day if needed.  Wants this called to [facility].  She had the 5-500 back in february but she said that dose is too strong for her all at once.  Initial call taken by: Lowella Petties CMA,  February 14, 2010 10:21 AM  Follow-up for Phone Call        okay to change to 1 tab two times a day as needed  #60 x 0 Follow-up by: Cindee Salt MD,  February 14, 2010 1:14 PM  Additional Follow-up for Phone Call Additional follow up Details #1::        Rx called to pharmacy, left message on machine that rx was called in. Additional Follow-up by: Mervin Hack CMA Duncan Dull),  February 14, 2010 3:23 PM    New/Updated Medications: HYDROCODONE-ACETAMINOPHEN 2.5-500 MG TABS (HYDROCODONE-ACETAMINOPHEN) take 1 by mouth two times a day as needed pain Prescriptions: HYDROCODONE-ACETAMINOPHEN 2.5-500 MG TABS (HYDROCODONE-ACETAMINOPHEN) take 1 by mouth two times a day as needed pain  #60 x 0   Entered by:   Mervin Hack CMA (AAMA)   Authorized by:   Cindee Salt MD   Signed by:   Mervin Hack CMA (AAMA) on 02/14/2010   Method used:   Telephoned to ...       [facility]* (retail)       [address]       [place], [place]  [address]       Ph: [phone]       Fax: [phone]   RxID:   [id number]

## 2010-12-29 NOTE — Letter (Signed)
Summary: Nadara Eaton letter  [place] [facility]  [address] [place], [place] [address]   Phone: [phone]  Fax: [phone]       06/29/2010 MRN: [id number]  [facility] [place] [address] [place], [place]  [address]  Dear Ms. Muegge,  [facility] [place], and [place] announce the retirement of Arta Silence, M.D., from full-time practice at the [facility] office effective May 26, 2010 and his plans of returning part-time.  It is important to Dr. Hetty Ely and to our practice that you understand that [facility] has seven physicians in our office for your health care needs.  We will continue to offer the same exceptional care that you have today.    Dr. Hetty Ely has spoken to many of you about his plans for retirement and returning part-time in the fall.   We will continue to work with you through the transition to schedule appointments for you in the office and meet the high standards that [facility] is committed to.   Again, it is with great pleasure that we share the news that Dr. Hetty Ely will return to [facility] at [facility] in October of 2011 with a reduced schedule.    If you have any questions, or would like to request an appointment with one of our physicians, please call us at [phone] and press the option for Scheduling an appointment.  We take pleasure in providing you with excellent patient care and look forward to seeing you at your next office visit.  Our [facility] Physicians are:  Tillman Abide, M.D. Laurita Quint, M.D. Roxy Manns, M.D. Kerby Nora, M.D. Hannah Beat, M.D. Ruthe Mannan, M.D. We proudly welcomed Raechel Ache, M.D. and Eustaquio Boyden, M.D. to the practice in July/August 2011.  Sincerely,  [facility]

## 2010-12-29 NOTE — Progress Notes (Signed)
Summary: refill request for darvocet  Phone Note Refill Request Message from:  Fax from Pharmacy  Refills Requested: Medication #1:  DARVOCET-N 100 100-650 MG  TABS two times a day PRN   Last Refilled: 08/04/2009 Faxed request from [facility], [phone].  Initial call taken by: Lowella Petties CMA,  September 03, 2009 3:20 PM  Follow-up for Phone Call        rx called in.Consuello Masse CMA  Follow-up by: Benny Lennert CMA (AAMA),  September 03, 2009 5:04 PM    Prescriptions: DARVOCET-N 100 100-650 MG  TABS (PROPOXYPHENE N-APAP) two times a day PRN  #45 x 0   Entered and Authorized by:   Kerby Nora MD   Signed by:   Kerby Nora MD on 09/03/2009   Method used:   Telephoned to ...       [facility]* (retail)       [address]       [place], [place]  [address]       Ph: [phone]       Fax: [phone]   RxID:   [id number]

## 2010-12-29 NOTE — Progress Notes (Signed)
Summary: RX Synthroid  Phone Note Refill Request Call back at [phone] Message from:  [facility] on September 13, 2009 4:22 PM  Refills Requested: Medication #1:  SYNTHROID 61 MCG  TABS 1 daily by mouth   Last Refilled: 07/14/2009  Method Requested: Electronic Initial call taken by: Sydell Axon LPN,  September 13, 2009 4:22 PM    Prescriptions: SYNTHROID 88 MCG  TABS (LEVOTHYROXINE SODIUM) 1 daily by mouth  #30 x 12   Entered and Authorized by:   Shaune Leeks MD   Signed by:   Shaune Leeks MD on 09/13/2009   Method used:   Electronically to        [facility]* (retail)       [address]       [place], [place]  [address]       Ph: [phone]       Fax: [phone]   RxID:   [id number]

## 2010-12-29 NOTE — Assessment & Plan Note (Signed)
Summary: DISCUSS MEDS & BP CHECK / LFW   Vital Signs:  Patient profile:   77 year old female Weight:      120 pounds BMI:     20.67 Temp:     98.2 degrees F oral Pulse rate:   96 / minute Pulse rhythm:   regular BP sitting:   130 / 90  (left arm) Cuff size:   regular  Vitals Entered By: Sydell Axon LPN (January 11, 2010 11:25 AM) CC: Discuss medicatiion, thinks pain medication is causing heart to beat fast, check BP   History of Present Illness: Pt here at her request to discuss having fast heartbeat. She thinks the Tramadol that she was given recently to replace Darvocet might be doing this. She was on Darvocet a long time for chronic  back pain and occas headache which she thinks is related to the back pain. She now describes palpitations and upper abd discomfort when taking the Tramadol. She otherwise is doing well.  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) 5)  Herpes Zoster, Left Temp[oral Area  (ICD-053.9) 6)  Gerd  (ICD-530.81) 7)  Uri  (ICD-465.9) 8)  Cervical Lymphadenopathy, Left  (ICD-785.6) 9)  Keratoacanthoma  (ICD-238.2) 10)  Abdominal Pain, Epigastric  (ICD-789.06) 11)  Interstitial Cystitis (DR EVANS)  (ICD-595.1) 12)  Back Pain, Chronic  (ICD-724.5) 13)  Disorder, Depressive Nec  (ICD-311) 14)  Thyroid Cancer, Hx of  (ICD-V10.87) 15)  Anorexia  (ICD-783.0) 16)  Irritable Bowel Syndrome  (ICD-564.1) 17)  Atrial Fibrillation, Paroxysmal  (ICD-427.31) 18)  Mitral Valve Prolapse  (ICD-424.0) 19)  Postmenopausal Status  (ICD-627.2) 20)  Colonic Polyps, Hx of  (ICD-V12.72) 21)  Allergic Rhinitis  (ICD-477.9) 22)  Sinusitis, Chronic  (ICD-473.9) 23)  Hypothyroidism  (ICD-244.9) 24)  Hypertension  (ICD-401.9) 25)  Hyperlipidemia  (ICD-272.4) 26)  Diverticulosis, Colon  (ICD-562.10)  Medications Prior to Update: 1)   Synthroid 88 Mcg  Tabs (Levothyroxine Sodium) .Marland Kitchen.. 1 Daily By Mouth 2)  [name] 2.5-5 Mg  Caps (Clidinium-Chlordiazepoxide) .Marland Kitchen.. 1 Tab By Mouth Two Times A Day As Needed 3)  Lisinopril 10 Mg  Tabs (Lisinopril) .... One Tab By Mouth At Night 4)  Cvs Allergy Relief 10 Mg Tbdp (Loratadine) .Marland Kitchen.. 1 Daily As Needed 5)  Centrum Silver  Tabs (Multiple Vitamins-Minerals) .Marland Kitchen.. 1 Daily By Mouth 6)  Prevacid 30 Mg Cpdr (Lansoprazole) .... Take One Tab 45 Mins Before Eating 7)  Remeron 15 Mg Tabs (Mirtazapine) .... One Tab By Mouth At Night 8)  Tramadol Hcl 50 Mg Tabs (Tramadol Hcl) .... One Tab By Mouth Two Times A Day As Needed For Back Pain 9)  Zostavax 00938 Unt/0.99ml Solr (Zoster Vaccine Live) .... Please Administer Immun.  Allergies: 1)  ! Penicillin G Sodium 2)  ! Cortisone Acetate (Cortisone Acetate) 3)  ! * Phenergan 4)  ! Gabapentin (Gabapentin)  Physical Exam  General:  Well-developed,well-nourished,in no acute distress; alert,appropriate and cooperative throughout examination Head:  Normocephalic and atraumatic without obvious abnormalities. No apparent alopecia or balding. Eyes:  No corneal or conjunctival inflammation noted. EOMI. Perrla. Funduscopic exam benign, without hemorrhages, exudates or papilledema. Vision grossly normal. Lungs:  Normal respiratory effort, chest expands symmetrically. Lungs are clear to auscultation, no crackles or wheezes. Heart:  Normal rate and regular rhythm. S1 and S2 normal without gallop, murmur, click, rub or other extra sounds.  Impression & Recommendations:  Problem # 1:  BACK PAIN, CHRONIC (ICD-724.5) Assessment Unchanged Stop Tramadol for worry about palpitations and upper abd discomfort. Will try Vicodin instead. Start with 1/2 Vicodin 5/500 daily. May go to whole pill if needed. The following medications were removed from the medication list:    Tramadol Hcl 50 Mg Tabs (Tramadol hcl) ..... One tab by mouth two times a day as needed for back  pain Her updated medication list for this problem includes:    Vicodin 5-500 Mg Tabs (Hydrocodone-acetaminophen) .Marland Kitchen... Take 1 tab by mouth daily for back pain.  Complete Medication List: 1)  Synthroid 88 Mcg Tabs (Levothyroxine sodium) .Marland Kitchen.. 1 daily by mouth 2)  [name] 2.5-5 Mg Caps (Clidinium-chlordiazepoxide) .Marland Kitchen.. 1 tab by mouth two times a day as needed 3)  Lisinopril 10 Mg Tabs (Lisinopril) .... One tab by mouth at night 4)  Cvs Allergy Relief 10 Mg Tbdp (Loratadine) .Marland Kitchen.. 1 daily as needed 5)  Centrum Silver Tabs (Multiple vitamins-minerals) .Marland Kitchen.. 1 daily by mouth 6)  Prevacid 30 Mg Cpdr (Lansoprazole) .... Take one tab 45 mins before eating 7)  Remeron 15 Mg Tabs (Mirtazapine) .... One tab by mouth at night 8)  Zostavax [address]409 Unt/0.9ml Solr (Zoster vaccine live) .... Please administer immun. 9)  Vicodin 5-500 Mg Tabs (Hydrocodone-acetaminophen) .... Take 1 tab by mouth daily for back pain.  Prescriptions: VICODIN 5-500 MG TABS (HYDROCODONE-ACETAMINOPHEN) take 1 tab by mouth daily for back pain.  #30 x 12   Entered and Authorized by:   Shaune Leeks MD   Signed by:   Shaune Leeks MD on 01/11/2010   Method used:   Print then Give to Patient   RxID:   [id number]   Current Allergies (reviewed today): ! PENICILLIN G SODIUM ! CORTISONE ACETATE (CORTISONE ACETATE) ! * PHENERGAN ! GABAPENTIN (GABAPENTIN)

## 2010-12-29 NOTE — Medication Information (Signed)
Summary: [facility] Prior Auth-Aciiphex  [facility] Prior Auth-Aciiphex   Imported By: Beau Fanny 12/04/2007 15:16:36  _____________________________________________________________________  External Attachment:    Type:   Image     Comment:   External Document

## 2010-12-29 NOTE — Progress Notes (Signed)
Summary: Prior Auth for Aciphex  Phone Note Call from Patient   Caller: Patient Call For: Shaune Leeks MD Summary of Call: Patient's  insurance company has changed and she  needs prior auth on Aciphex. [facility] at [phone].  Initial call taken by: Sydell Axon LPN,  December 09, 2009 11:04 AM  Follow-up for Phone Call        [employer] and information given to [place]. Was informed that this information will be forwarded to their review board and a letter will go out within 72 hours to let the patient know if this will be covered or not. Follow-up by: Sydell Axon LPN,  December 09, 2009 11:09 AM  Additional Follow-up for Phone Call Additional follow up Details #1::        Received information from [facility] [facility] that Aciphex is denied because it is not a covered drug on the patients formulary.  An appeal can be made.  Forms in your IN box Additional Follow-up by: Linde Gillis CMA Duncan Dull),  December 09, 2009 3:35 PM    Additional Follow-up for Phone Call Additional follow up Details #2::    Try Pantoprazole 40 mg. Script in my outbbox. Follow-up by: Shaune Leeks MD,  December 15, 2009 7:06 AM  Additional Follow-up for Phone Call Additional follow up Details #3:: Details for Additional Follow-up Action Taken: Forms faxed to [facility] [facility] [phone].  Rx called to [facility].  Forms sent to be scanned.  Additional Follow-up by: Linde Gillis CMA Duncan Dull),  December 15, 2009 9:24 AM  New/Updated Medications: PANTOPRAZOLE SODIUM 40 MG TBEC (PANTOPRAZOLE SODIUM) Take one tab 45 mins prior to eating. Prescriptions: PANTOPRAZOLE SODIUM 40 MG TBEC (PANTOPRAZOLE SODIUM) Take one tab 45 mins prior to eating.  #90 x 3   Entered and Authorized by:   Shaune Leeks MD   Signed by:   Shaune Leeks MD on 12/15/2009   Method used:   Printed then faxed to ...       [facility]* (retail)       [address]  [place], [place]  [address]       Ph: [phone]       Fax: [phone]   RxID:   [id number]

## 2010-12-29 NOTE — Progress Notes (Signed)
Summary: Questions about meds and ? laxative  Phone Note Call from Patient Call back at Home Phone [phone]   Caller: Patient Call For: Shaune Leeks MD Action Taken: Patient advised to go to ER Summary of Call: Patient was recently hospitalized for  diverticulosis and shingles.  She has some questions about her medications and she says she has not had a BM in 3 days.  Wants to know if she can take something mild to help her have a BM. Initial call taken by: Delilah Shan,  February 16, 2009 11:20 AM  Follow-up for Phone Call        Spoke to pt, she asks what you would recommend.  Follow-up by: Lowella Petties,  February 16, 2009 11:42 AM  Additional Follow-up for Phone Call Additional follow up Details #1::        Try MOM 2 Tbsp now and repeat once in 4 hours if no results. May do the same thing tomm if necessary. If unsuccessful with that, come in to be seen. Additional Follow-up by: Shaune Leeks MD,  February 16, 2009 11:51 AM    Additional Follow-up for Phone Call Additional follow up Details #2::    Advised pt. Follow-up by: Lowella Petties,  February 16, 2009 1:18 PM

## 2010-12-29 NOTE — Assessment & Plan Note (Signed)
Summary: light headed,meds./bir  Medications Added ACIPHEX 20 MG  TBEC (RABEPRAZOLE SODIUM) 1 BID SYNTHROID 88 MCG  TABS (LEVOTHYROXINE SODIUM) 1 QD ARTHROTEC 75   TABS (DICLOFENAC-MISOPROSTOL TABS) two times a day PRN [name] 2.5-5 MG  CAPS (CLIDINIUM-CHLORDIAZEPOXIDE) 1 tab by mouth two times a day as needed CLARITIN 10 MG  TABS (LORATADINE) as needed AS NEEDED DARVOCET-N 100 100-650 MG  TABS (PROPOXYPHENE N-APAP) two times a day PRN LISINOPRIL 10 MG  TABS (LISINOPRIL) one tab by mouth at night CIPRO 500 MG  TABS (CIPROFLOXACIN HCL) one tab by mouth two times a day      Allergies Added: ! PENICILLIN G SODIUM (PENICILLIN G SODIUM SOLR) ! CORTISONE ACETATE (CORTISONE ACETATE)  Vital Signs:  Patient Profile:   77 Years Old Female Height:     64.5 inches (163.83 cm) Weight:      121 pounds Temp:     98.3 degrees F oral Pulse rate:   80 / minute BP sitting:   120 / 80  (left arm) Cuff size:   regular  Vitals Entered By: Providence Crosby (May 15, 2007 2:41 PM)               Chief Complaint:  DIZZY/ WEAK IN KNEES / ?U.T.I./ REFILL DARVOCET.  History of Present Illness: Had kidney infections off and on   had one when seen by Dr Logan Bores...wonders about interstitial cystitis. Also having lightheadedness and weak in knees...has indigestion routinely. Last UTI  03/29/07  on Macrobid  Current Allergies: ! PENICILLIN G SODIUM (PENICILLIN G SODIUM SOLR) ! CORTISONE ACETATE (CORTISONE ACETATE)      Physical Exam  General:     Well-developed,well-nourished,in no acute distress; alert,appropriate and cooperative throughout examination Head:     Normocephalic and atraumatic without obvious abnormalities. No apparent alopecia or balding. Eyes:     Conjunctiva clear bilaterally.  Ears:     External ear exam shows no significant lesions or deformities.  Otoscopic examination reveals clear canals, tympanic membranes are intact bilaterally without bulging, retraction, inflammation or  discharge. Hearing is grossly normal bilaterally. Nose:     External nasal examination shows no deformity or inflammation. Nasal mucosa are pink and moist without lesions or exudates. Mouth:     Oral mucosa and oropharynx without lesions or exudates.  Teeth in good repair. Neck:     No deformities, masses, or tenderness noted. Chest Wall:     No deformities, masses, or tenderness noted. Lungs:     Normal respiratory effort, chest expands symmetrically. Lungs are clear to auscultation, no crackles or wheezes. Heart:     Normal rate and regular rhythm. S1 and S2 normal without gallop, murmur, click, rub or other extra sounds. Abdomen:     Bowel sounds positive,abdomen soft and non-tender without masses, organomegaly or hernias noted. No suprapubic tenderness. No CVA tenderness.    Impression & Recommendations:  Problem # 1:  U T I (ICD-599.0) Assessment: New Cipro 500mg  two times a day  20/0   U?A pos    Dr Logan Bores thinks she has ICS wants to do hydrodistention. Her updated medication list for this problem includes:    Cipro 500 Mg Tabs (Ciprofloxacin hcl) ..... One tab by mouth two times a day  Encouraged to push clear liquids, get enough rest, and take acetaminophen as needed. To be seen in 10 days if no improvement, sooner if worse.   Problem # 2:  BACK PAIN, CHRONIC (ICD-724.5) Assessment: Unchanged Wants more Darvocet for back to "to  avoid surgery" Her updated medication list for this problem includes:    Arthrotec 75 Tabs (Diclofenac-misoprostol tabs) .Marland Kitchen..Marland Kitchen Two times a day prn    Darvocet-n 100 100-650 Mg Tabs (Propoxyphene n-apap) .Marland Kitchen..Marland Kitchen Two times a day prn   Problem # 3:  DISORDER, DEPRESSIVE NEC (ICD-311) Assessment: Unchanged Stable...seems pretty good today.  Problem # 4:  HYPOTHYROIDISM (ICD-244.9) Assessment: Unchanged  Her updated medication list for this problem includes:    Synthroid 88 Mcg Tabs (Levothyroxine sodium) .Marland Kitchen... 1 qd   Problem # 5:  HYPERTENSION  (ICD-401.9) Assessment: Improved  Her updated medication list for this problem includes:    Lisinopril 10 Mg Tabs (Lisinopril) ..... One tab by mouth at night  BP today: 120/80 Prior BP: 120/70 (08/23/2006)   Medications Added to Medication List This Visit: 1)  Aciphex 20 Mg Tbec (Rabeprazole sodium) .Marland Kitchen.. 1 bid 2)  Synthroid 88 Mcg Tabs (Levothyroxine sodium) .Marland Kitchen.. 1 qd 3)  Arthrotec 75 Tabs (Diclofenac-misoprostol tabs) .... Two times a day prn 4)  [name] 2.5-5 Mg Caps (Clidinium-chlordiazepoxide) .Marland Kitchen.. 1 tab by mouth two times a day as needed 5)  Claritin 10 Mg Tabs (Loratadine) .... As needed as needed 6)  Darvocet-n 100 100-650 Mg Tabs (Propoxyphene n-apap) .... Two times a day prn 7)  Lisinopril 10 Mg Tabs (Lisinopril) .... One tab by mouth at night 8)  Cipro 500 Mg Tabs (Ciprofloxacin hcl) .... One tab by mouth two times a day   Patient Instructions: 1)  Please schedule a follow-up appointment as needed.    Laboratory Results   Urine Tests  Date/Time Recieved: May 15, 2007 2:45 PM  Date/Time Reported: May 15, 2007 2:45 PM   Routine Urinalysis   Color: lt. yellow Appearance: Cloudy Glucose: negative   (Normal Range: Negative) Bilirubin: negative   (Normal Range: Negative) Ketone: negative   (Normal Range: Negative) Spec. Gravity: <1.005   (Normal Range: 1.003-1.035) Blood: trace-lysed   (Normal Range: Negative) pH: 6.0   (Normal Range: 5.0-8.0) Protein: negative   (Normal Range: Negative) Urobilinogen: 0.2   (Normal Range: 0-1) Nitrite: negative   (Normal Range: Negative) Leukocyte Esterace: large   (Normal Range: Negative)

## 2010-12-29 NOTE — Progress Notes (Signed)
Summary: refill darvocet n  Phone Note Refill Request Message from:  Fax from Pharmacy on July 05, 2009 11:14 AM  Refills Requested: Medication #1:  DARVOCET-N 100 100-650 MG  TABS two times a day PRN   Last Refilled: 06/03/2009 [facility] [phone]  Initial call taken by: Providence Crosby LPN,  July 05, 2009 11:15 AM  Follow-up for Phone Call        prescription called to pharmacey line Follow-up by: Providence Crosby LPN,  July 05, 2009 2:07 PM    Prescriptions: DARVOCET-N 100 100-650 MG  TABS (PROPOXYPHENE N-APAP) two times a day PRN  #45 x 0   Entered and Authorized by:   Shaune Leeks MD   Signed by:   Shaune Leeks MD on 07/05/2009   Method used:   Telephoned to ...       [facility]* (retail)       [address]       [place], [place]  [address]       Ph: [phone]       Fax: [phone]   RxID:   [id number]

## 2010-12-29 NOTE — Assessment & Plan Note (Signed)
Summary: F/U [facility] ER ON 11/09/07/CLE    Vital Signs:  Patient Profile:   77 Years Old Female Height:     64.5 inches (163.83 cm) Weight:      119 pounds Temp:     97.4 degrees F oral Pulse rate:   88 / minute Pulse rhythm:   regular BP sitting:   100 / 60  (left arm) Cuff size:   regular  Vitals Entered ByMarland Kitchen Providence Crosby (November 18, 2007 2:00 PM)                 Chief Complaint:  F/U ER.  History of Present Illness: Has pain in subxyphoid area, seen in ER 11/03/07 [facility] dx'd with gastroenteritis and urinary infection put on Cipro,apparently with good results. HadAbd/pelvic CT w/o showing only past choleycystectomy and diverticulosis as well as thickening of basal lung interstitial markings. Is taking Aciphex twice a day. Drinks coffee in the AM and sleeps on multiple pillows.  Also needs prescriptions written and needs pain medicine, takes Darvocet N 100 one a day, never more.  Current Allergies (reviewed today): ! PENICILLIN G SODIUM ! CORTISONE ACETATE (CORTISONE ACETATE)      Physical Exam  General:     Well-developed,well-nourished,in no acute distress; alert,appropriate and cooperative throughout examination Head:     Normocephalic and atraumatic without obvious abnormalities. No apparent alopecia or balding. Eyes:     Conjunctiva clear bilaterally.  Ears:     External ear exam shows no significant lesions or deformities.  Otoscopic examination reveals clear canals, tympanic membranes are intact bilaterally without bulging, retraction, inflammation or discharge. Hearing is grossly normal bilaterally. Nose:     External nasal examination shows no deformity or inflammation. Nasal mucosa are pink and moist without lesions or exudates. Mouth:     Oral mucosa and oropharynx without lesions or exudates.  Teeth in good repair. Neck:     No deformities, masses, or tenderness noted. Chest Wall:     No deformities, masses, or tenderness noted. Lungs:     Normal  respiratory effort, chest expands symmetrically. Lungs are clear to auscultation, no crackles or wheezes. Heart:     Normal rate and regular rhythm. S1 and S2 normal without gallop, murmur, click, rub or other extra sounds. Abdomen:     Bowel sounds positive,abdomen soft and non-tender without masses, organomegaly or hernias noted.    Impression & Recommendations:  Problem # 1:  ABDOMINAL PAIN, EPIGASTRIC (ICD-789.06) Assessment: New Presumed gastritis, poss ulcer on Aciphex two times a day. With nml CT, do not think mass, diverticulitis or stones are likely. Avoid Caffeine completely and block up head of bed...do not sleep on pillows.Discussed symptom control with the patient. Cont Aciphex.  Problem # 2:  BACK PAIN, CHRONIC (ICD-724.5) Assessment: Unchanged Cont Darvocet as needed. The following medications were removed from the medication list:    Arthrotec 75 Tabs (Diclofenac-misoprostol tabs) .Marland Kitchen..Marland Kitchen Two times a day prn  Her updated medication list for this problem includes:    Darvocet-n 100 100-650 Mg Tabs (Propoxyphene n-apap) .Marland Kitchen..Marland Kitchen Two times a day prn   Problem # 3:  HYPERTENSION (ICD-401.9) Assessment: Improved  Her updated medication list for this problem includes:    Lisinopril 10 Mg Tabs (Lisinopril) ..... One tab by mouth at night  BP today: 100/60 Prior BP: 120/80 (05/15/2007)   Problem # 4:  DIVERTICULOSIS, COLON (ICD-562.10) Assessment: Unchanged Seems stable via Abd/Pelvic CT.  Problem # 5:  KERATOACANTHOMA (ICD-238.2) Assessment: New Superior aspect of right helix  of ear.  Totally leave alone and do not irritate. If sxs continue, RTC for shave bx and Cautery.  Complete Medication List: 1)  Aciphex 20 Mg Tbec (Rabeprazole sodium) .Marland Kitchen.. 1 bid 2)  Synthroid 88 Mcg Tabs (Levothyroxine sodium) .Marland Kitchen.. 1 qd 3)  [name] 2.5-5 Mg Caps (Clidinium-chlordiazepoxide) .Marland Kitchen.. 1 tab by mouth two times a day as needed 4)  Claritin 10 Mg Tabs (Loratadine) .... As needed as  needed 5)  Darvocet-n 100 100-650 Mg Tabs (Propoxyphene n-apap) .... Two times a day prn 6)  Lisinopril 10 Mg Tabs (Lisinopril) .... One tab by mouth at night 7)  Bactrim Ds 800-160 Mg Tabs (Sulfamethoxazole-trimethoprim) .... One tab by mouth two times a day   Patient Instructions: 1)  RTC if sxs cont...will check H.Pylori and poss refer to GI after changing PPI 2)  RTC for ear lesion excision if no improvement. 3)  Scripts rewritten.    Prescriptions: LISINOPRIL 10 MG  TABS (LISINOPRIL) one tab by mouth at night  #30 x prn   Entered and Authorized by:   Shaune Leeks MD   Signed by:   Shaune Leeks MD on 11/18/2007   Method used:   Print then Give to Patient   RxID:   [id number] DARVOCET-N 100 100-650 MG  TABS (PROPOXYPHENE N-APAP) two times a day PRN  #45 x 5   Entered and Authorized by:   Shaune Leeks MD   Signed by:   Shaune Leeks MD on 11/18/2007   Method used:   Print then Give to Patient   RxID:   [id number] CLARITIN 10 MG  TABS (LORATADINE) as needed AS NEEDED  #30 x 12   Entered and Authorized by:   Shaune Leeks MD   Signed by:   Shaune Leeks MD on 11/18/2007   Method used:   Print then Give to Patient   RxID:   [id number] [name] 2.5-5 MG  CAPS (CLIDINIUM-CHLORDIAZEPOXIDE) 1 tab by mouth two times a day as needed  #60 x 5   Entered and Authorized by:   Shaune Leeks MD   Signed by:   Shaune Leeks MD on 11/18/2007   Method used:   Print then Give to Patient   RxID:   [id number] ARTHROTEC 75   TABS (DICLOFENAC-MISOPROSTOL TABS) two times a day PRN  #0 x 0   Entered and Authorized by:   Shaune Leeks MD   Signed by:   Shaune Leeks MD on 11/18/2007   Method used:   Print then Give to Patient   RxID:   [id number] SYNTHROID 88 MCG  TABS (LEVOTHYROXINE SODIUM) 1 QD  #30 x 12   Entered and Authorized by:   Shaune Leeks MD   Signed by:   Shaune Leeks MD  on 11/18/2007   Method used:   Print then Give to Patient   RxID:   [id number] ACIPHEX 20 MG  TBEC (RABEPRAZOLE SODIUM) 1 BID  #60 x 12   Entered and Authorized by:   Shaune Leeks MD   Signed by:   Shaune Leeks MD on 11/18/2007   Method used:   Print then Give to Patient   RxID:   [id number]  ]

## 2010-12-29 NOTE — Consult Note (Signed)
Summary: Consultation Report  Consultation Report   Imported By: Mickle Asper 05/02/2007 15:33:35  _____________________________________________________________________  External Attachment:    Type:   Image     Comment:   External Document

## 2010-12-29 NOTE — Medication Information (Signed)
Summary: [employer] -Aciphex is not covered on drug list  [employer] -Aciphex is not covered on drug list   Imported By: Beau Fanny 02/24/2010 14:33:54  _____________________________________________________________________  External Attachment:    Type:   Image     Comment:   External Document

## 2010-12-29 NOTE — Assessment & Plan Note (Signed)
Summary: DISCUSS PAIN MEDICATION/CLE   Vital Signs:  Patient profile:   77 year old female Weight:      119 pounds Temp:     97.9 degrees F oral Pulse rate:   80 / minute Pulse rhythm:   regular BP sitting:   128 / 80  (left arm) Cuff size:   regular  Vitals Entered By: Sydell Axon LPN (October 28, 2009 11:27 AM) CC: Discuss pain medication   History of Present Illness: Pt here for Darvocet discussion as it has been taken off the market. She takes it for chronic back pain. She was evaluated for poss back surgery and was started on Darvocet with good results. She averaged one a day. She has never had Tramadol and is willing to try it. She cannot take Gabapentin. She otherwise has no complaints. She has finally gotten over her shingles. She was counselled to get Zostavax.  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) 5)  Herpes Zoster, Left Temp[oral Area  (ICD-053.9) 6)  Gerd  (ICD-530.81) 7)  Uri  (ICD-465.9) 8)  Cervical Lymphadenopathy, Left  (ICD-785.6) 9)  Keratoacanthoma  (ICD-238.2) 10)  Abdominal Pain, Epigastric  (ICD-789.06) 11)  Interstitial Cystitis (DR EVANS)  (ICD-595.1) 12)  Back Pain, Chronic  (ICD-724.5) 13)  Disorder, Depressive Nec  (ICD-311) 14)  Thyroid Cancer, Hx of  (ICD-V10.87) 15)  Anorexia  (ICD-783.0) 16)  Irritable Bowel Syndrome  (ICD-564.1) 17)  Atrial Fibrillation, Paroxysmal  (ICD-427.31) 18)  Mitral Valve Prolapse  (ICD-424.0) 19)  Postmenopausal Status  (ICD-627.2) 20)  Colonic Polyps, Hx of  (ICD-V12.72) 21)  Allergic Rhinitis  (ICD-477.9) 22)  Sinusitis, Chronic  (ICD-473.9) 23)  Hypothyroidism  (ICD-244.9) 24)  Hypertension  (ICD-401.9) 25)  Hyperlipidemia  (ICD-272.4) 26)  Diverticulosis, Colon  (ICD-562.10)  Medications Prior to Update: 1)  Synthroid 88 Mcg  Tabs (Levothyroxine Sodium)  .Marland Kitchen.. 1 Daily By Mouth 2)  [name] 2.5-5 Mg  Caps (Clidinium-Chlordiazepoxide) .Marland Kitchen.. 1 Tab By Mouth Two Times A Day As Needed 3)  Darvocet-N 100 100-650 Mg  Tabs (Propoxyphene N-Apap) .... Two Times A Day Prn 4)  Lisinopril 10 Mg  Tabs (Lisinopril) .... One Tab By Mouth At Night 5)  [facility]vs Allergy Relief 10 Mg Tbdp (Loratadine) .Marland Kitchen.. 1 Daily As Needed 6)  Centrum Silver  Tabs (Multiple Vitamins-Minerals) .Marland Kitchen.. 1 Daily By Mouth 7)  Aciphex 20 Mg Tbec (Rabeprazole Sodium) .... One Tab 45 Mins Before Eating. 8)  Remeron 15 Mg Tabs (Mirtazapine) .... One Tab By Mouth At Night 9)  Nexium 40 Mg Cpdr (Esomeprazole Magnesium) .Marland Kitchen.. 1 Daily By Mouth 10)  Macrobid 100 Mg Caps (Nitrofurantoin Monohyd Macro) .... One Tab By Mouth Two Times A Day 11)  Cipro 500 Mg Tabs (Ciprofloxacin Hcl) .... One Tab By Mouth Two Times A Day 12)  Fluconazole 150 Mg Tabs (Fluconazole) .Marland Kitchen.. 1 Tab By Mouth X 1 Day..take After Antibiotics Completed.  Allergies: 1)  ! Penicillin G Sodium 2)  ! Cortisone Acetate (Cortisone Acetate) 3)  ! * Phenergan 4)  ! Gabapentin (Gabapentin)  Physical Exam  General:  Well-developed,well-nourished,in no acute distress; alert,appropriate and cooperative throughout examination Head:  Normocephalic and atraumatic without obvious abnormalities. No apparent alopecia or balding. Eyes:  No corneal or conjunctival inflammation noted. EOMI. Perrla. Funduscopic exam benign, without hemorrhages, exudates or papilledema. Vision grossly normal. Ears:  External ear  exam shows no significant lesions or deformities.  Otoscopic examination reveals clear canals, tympanic membranes are intact bilaterally without bulging, retraction, inflammation or discharge. Hearing is grossly normal bilaterally. Nose:  External nasal examination shows no deformity or inflammation. Nasal mucosa are pink and moist without lesions or exudates. Mouth:  Oral mucosa and oropharynx without lesions or exudates.  Teeth in good repair.  Neck:  No deformities, masses, or tenderness noted. Lungs:  Normal respiratory effort, chest expands symmetrically. Lungs are clear to auscultation, no crackles or wheezes. Heart:  Normal rate and regular rhythm. S1 and S2 normal without gallop, murmur, click, rub or other extra sounds. Abdomen:  Bowel sounds positive,abdomen soft and non-tender without masses, organomegaly or hernias noted. Msk:  Low back slightly decreased ROM.   Impression & Recommendations:  Problem # 1:  BACK PAIN, CHRONIC (ICD-724.5) Assessment Unchanged  Stable ans bearable taking one Darvocet a day which are no longer available. Will try Tramadol once or twice a day as needed. The following medications were removed from the medication list:    Darvocet-n 100 100-650 Mg Tabs (Propoxyphene n-apap) .Marland Kitchen..Marland Kitchen Two times a day prn Her updated medication list for this problem includes:    Tramadol Hcl 50 Mg Tabs (Tramadol hcl) ..... One tab by mouth two times a day as needed for back pain  Problem # 2:  HERPES ZOSTER, LEFT TEMP[ORAL AREA (ICD-053.9) Assessment: Improved Resolved.Counselled to get Shingles shot. Discussed risk of recurrence.  Complete Medication List: 1)  Synthroid 88 Mcg Tabs (Levothyroxine sodium) .Marland Kitchen.. 1 daily by mouth 2)  [name] 2.5-5 Mg Caps (Clidinium-chlordiazepoxide) .Marland Kitchen.. 1 tab by mouth two times a day as needed 3)  Lisinopril 10 Mg Tabs (Lisinopril) .... One tab by mouth at night 4)  Cvs Allergy Relief 10 Mg Tbdp (Loratadine) .Marland Kitchen.. 1 daily as needed 5)  Centrum Silver Tabs (Multiple vitamins-minerals) .Marland Kitchen.. 1 daily by mouth 6)  Aciphex 20 Mg Tbec (Rabeprazole sodium) .... One tab 45 mins before eating. 7)  Remeron 15 Mg Tabs (Mirtazapine) .... One tab by mouth at night 8)  Tramadol Hcl 50 Mg Tabs (Tramadol hcl) .... One tab by mouth two times a day as needed for back pain  Patient Instructions: 1)  Needs Td next time. Prescriptions: TRAMADOL HCL 50 MG TABS (TRAMADOL HCL) one tab by mouth two  times a day as needed for back pain  #60 x 6   Entered and Authorized by:   Shaune Leeks MD   Signed by:   Shaune Leeks MD on 10/28/2009   Method used:   Electronically to        [facility]* (retail)       [address]       [place], [place]  [address]       Ph: [phone]       Fax: [phone]   RxID:   [id number]   Current Allergies (reviewed today): ! PENICILLIN G SODIUM ! CORTISONE ACETATE (CORTISONE ACETATE) ! * PHENERGAN ! GABAPENTIN (GABAPENTIN)

## 2010-12-29 NOTE — Progress Notes (Signed)
Summary: refill request for darvocet  Phone Note Refill Request Message from:  Fax from Pharmacy  Refills Requested: Medication #1:  DARVOCET-N 100 100-650 MG  TABS two times a day PRN   Last Refilled: 02/23/2009 Faxed request from [facility], phone [phone].  Initial call taken by: Lowella Petties,  May 05, 2009 1:16 PM  Follow-up for Phone Call        Called to [facility]. Follow-up by: Lowella Petties,  May 05, 2009 1:40 PM      Prescriptions: DARVOCET-N 100 100-650 MG  TABS (PROPOXYPHENE N-APAP) two times a day PRN  #45 x 0   Entered and Authorized by:   Shaune Leeks MD   Signed by:   Shaune Leeks MD on 05/05/2009   Method used:   Telephoned to ...       [facility]* (retail)       [address]       [place], [place]  [address]       Ph: [phone]       Fax: [phone]   RxID:   [id number]

## 2010-12-29 NOTE — Progress Notes (Signed)
Summary: MED PROBLEM  Phone Note Call from Patient   Summary of Call: Pt was sent home from hospital with 300mg  of gabapentin three times a day, but says she feels very woozy and stopped med. She has begun to feel better. She just wanted to let us know and make sure that it was not  to treat an infection. Informed pt this med was for pain from her shingles. Advised pt that she is to call office with any changes in symptoms. Pt says she spoke with Dr Reita Chard office and they told her she needed to ask Dr Debby Bud this. Routing phone note to pt's PCP and Dr Debby Bud for review. Initial call taken by: Lamar Sprinkles,  February 11, 2009 1:51 PM  Follow-up for Phone Call        Agree. Follow-up by: Shaune Leeks MD,  February 11, 2009 1:59 PM  Additional Follow-up for Phone Call Additional follow up Details #1::        It is fine for her to tritrate the gabapentin to her level of pain. I will bow out, deferring to her PCP. Additional Follow-up by: Jacques Navy MD,  February 15, 2009 8:09 AM    Additional Follow-up for Phone Call Additional follow up Details #2::    If pt having further problems, please have her come in to be seen. Follow-up by: Shaune Leeks MD,  February 15, 2009 8:13 AM

## 2010-12-29 NOTE — Progress Notes (Signed)
Summary: cant take pantoprazole  Phone Note Call from Patient Call back at Home Phone [phone]   Caller: Patient Call For: Shaune Leeks MD Summary of Call: Pt states she cant take pantoprazole- that she was changed to from aciphex.  She says this makes her feel bad, makes her heart beat fast.  Insurance says she still has to try and fail zegerid and prevacid.  [facility]ses [facility]. Initial call taken by: Lowella Petties CMA,  January 05, 2010 10:47 AM  Follow-up for Phone Call        Advised pt. Follow-up by: Lowella Petties CMA,  January 05, 2010 3:24 PM    New/Updated Medications: PREVACID 30 MG CPDR (LANSOPRAZOLE) Take one tab 45 mins before eating Prescriptions: PREVACID 30 MG CPDR (LANSOPRAZOLE) Take one tab 45 mins before eating  #30 x 12   Entered and Authorized by:   Shaune Leeks MD   Signed by:   Shaune Leeks MD on 01/05/2010   Method used:   Electronically to        [facility]* (retail)       [address]       [place], [place]  [address]       Ph: [phone]       Fax: [phone]   RxID:   [id number]

## 2010-12-29 NOTE — Assessment & Plan Note (Signed)
Summary: 1 M F/U   Vital Signs:  Patient Profile:   77 Years Old Female Height:     64.5 inches (163.83 cm) Weight:      123 pounds Temp:     97.8 degrees F oral Pulse rate:   76 / minute Pulse rhythm:   regular BP sitting:   120 / 70  (left arm) Cuff size:   regular  Vitals Entered ByMarland Kitchen Providence Crosby (January 05, 2009 10:04 AM)                 Chief Complaint:  change aciphex due to insurance.  History of Present Illness: Pt here for resolutiuon of insurance requesting change in Aciphex. She is on Aciphex twice a day and hasn't seen Dr Jarold Motto and she switched insurance which is now balking on covering her two times a day aciphex tht she has been doing well on for quite some time. Once a day gives her severe heartburn. She is careful with caffeine and tomato products, doesn't eat 3 hrs or less before bed, has elevated the head of her bed. She had endoscopy in 07.    Prior Medications Reviewed Using: Patient Recall  Current Allergies (reviewed today): ! PENICILLIN G SODIUM ! CORTISONE ACETATE (CORTISONE ACETATE)  Past Surgical History:    COLOSCOPY POLYP REMOVAL (1995)    Choleycystectomy, 1991    Colonosc divertics, int hemms, 1995    CT ABD AND PELVIS     THYROID R NODULE EXCISION Carcinoma (01/1994)    Total thyroidectomy, 04/1994 due to thyroid cancer    EGD HH, 1998    PELVIC U/S- NORMAL (03/2001)    ECHO Myxomatous thickening Mitral valve Mild MVP Tr MR Tr TR 05/23/2002    DEXA T-2.6 NECK OF HIP 3.5    COLONOSCOPY BX NEG (Dr Jarold Motto) (05/28/2006)    EGD (Dr Jarold Motto) (05/28/2006)    Hip pinning R de to Fracture of hip, 08/1993    Hysterectomy      Physical Exam  General:     Well-developed,well-nourished,in no acute distress; alert,appropriate and cooperative throughout examination, mildly congested. Head:     Normocephalic and atraumatic without obvious abnormalities. No apparent alopecia or balding. Sinuses mildly tender bilat. Eyes:  Conjunctiva clear bilaterally.  Ears:     External ear exam shows no significant lesions or deformities.  Otoscopic examination reveals clear canals, tympanic membranes are intact bilaterally without bulging, retraction, inflammation or discharge. Hearing is grossly normal bilaterally. Mild thin cerumen dried bilat. Nose:     External nasal examination shows no deformity or inflammation. Nasal mucosa are pink and moist without lesions or exudates. Mouth:     Oral mucosa and oropharynx without lesions or exudates.  Teeth in good repair. Neck:     No deformities, masses, or tenderness noted. Lungs:     Normal respiratory effort, chest expands symmetrically. Lungs are clear to auscultation, no crackles or wheezes. Heart:     Normal rate and regular rhythm. S1 and S2 normal without gallop, murmur, click, rub or other extra sounds. Abdomen:     Bowel sounds positive,abdomen soft and non-tender without masses, organomegaly or hernias noted.    Impression & Recommendations:  Problem # 1:  ABDOMINAL PAIN, EPIGASTRIC (ICD-789.06) Assessment: Improved Does well as long as taking PPI twice a day. Will try changing to Nexiun 20mg  two times a day (is in her pharmacy book) QL.  Script sent.  Problem # 2:  CERVICAL LYMPHADENOPATHY, LEFT (ICD-785.6) Assessment: New Watch for  2-4 weeks, if dopesn't resolve will require biopsy. Pt aware.  Problem # 3:  URI (ICD-465.9) Assessment: New See instructions.  Hopefully the reason for the Lyn[place] node. Her updated medication list for this problem includes:    [facility] Allergy Relief 10 Mg Tbdp (Loratadine) .Marland Kitchen... 1 daily as needed   Complete Medication List: 1)  Aciphex 20 Mg Tbec (Rabeprazole sodium) .Marland Kitchen.. 1 twice a day by mouth 2)  Synthroid 88 Mcg Tabs (Levothyroxine sodium) .Marland Kitchen.. 1 daily by mouth 3)  [name] 2.5-5 Mg Caps (Clidinium-chlordiazepoxide) .Marland Kitchen.. 1 tab by mouth two times a day as needed 4)  Darvocet-n 100 100-650 Mg Tabs (Propoxyphene n-apap) ....  Two times a day prn 5)  Lisinopril 10 Mg Tabs (Lisinopril) .... One tab by mouth at night 6)  [facility] Allergy Relief 10 Mg Tbdp (Loratadine) .Marland Kitchen.. 1 daily as needed 7)  Centrum Silver Tabs (Multiple vitamins-minerals) .Marland Kitchen.. 1 daily by mouth 8)  Move Free Triple Strength  .... 1 daily by mouth 9)  Nexium 20 Mg Cpdr (Esomeprazole magnesium) .... One tab by mouth twice a day.   Patient Instructions: 1)  Let me know if gland on left jaw does not go away. 2)  Start Nexium 20mg  two times a day. If problem with insurnce, will refer to Dr Jarold Motto. 3)  For congestion, Take Guaifenesin by [facility] or [facility] and getting MUCOUS RELIEF EXPECTORANT (400mg ), take 11/2 tabs by mouth AM and NOON. 4)  Drink lots of fluids anytime taking Guaifenesin.    Prescriptions: NEXIUM 20 MG CPDR (ESOMEPRAZOLE MAGNESIUM) one tab by mouth twice a day.  #60 x 12   Entered and Authorized by:   Shaune Leeks MD   Signed by:   Shaune Leeks MD on 01/05/2009   Method used:   Electronically to        [facility]* (retail)       [address]       [place], [place]  [address]       Ph: [phone]       Fax: [phone]   RxID:   [id number]

## 2010-12-29 NOTE — Progress Notes (Signed)
Summary: refill request for vicodin  Phone Note Refill Request Message from:  Fax from Pharmacy  Refills Requested: Medication #1:  HYDROCODONE-ACETAMINOPHEN 2.5-500 MG TABS take 1 by mouth two times a day as needed pain   Last Refilled: 07/22/2010 Faxed request from [facility], [phone].  Initial call taken by: Lowella Petties CMA,  September 09, 2010 2:51 PM  Follow-up for Phone Call        please call in.  Follow-up by: Crawford Givens MD,  September 11, 2010 6:16 PM  Additional Follow-up for Phone Call Additional follow up Details #1::        Rx called to pharmacy Additional Follow-up by: Sydell Axon LPN,  September 12, 2010 11:01 AM    Prescriptions: HYDROCODONE-ACETAMINOPHEN 2.5-500 MG TABS (HYDROCODONE-ACETAMINOPHEN) take 1 by mouth two times a day as needed pain  #60 x 5   Entered and Authorized by:   Crawford Givens MD   Signed by:   Crawford Givens MD on 09/11/2010   Method used:   Telephoned to ...       [facility]* (retail)       [address]       [place], [place]  [address]       Ph: [phone]       Fax: [phone]   RxID:   [id number]

## 2010-12-29 NOTE — Progress Notes (Signed)
Summary: still with UTI symptoms  Phone Note Call from Patient Call back at Home Phone [phone]   Caller: Patient Call For: Shaune Leeks MD Summary of Call: Pt finished abx for UTI last friday, symptoms got better but have now returned. She is asking if another abx can be called to [facility].  No fever, has pressure and burning. Initial call taken by: Lowella Petties,  Apr 13, 2009 9:00 AM  Follow-up for Phone Call        Culture sens to most....will try Cipro for 2 weeks.   Pleease have her take all of the med. Follow-up by: Shaune Leeks MD,  Apr 13, 2009 9:10 AM  Additional Follow-up for Phone Call Additional follow up Details #1::        Advised pt. Additional Follow-up by: Lowella Petties,  Apr 13, 2009 9:28 AM    New/Updated Medications: CIPRO 500 MG TABS (CIPROFLOXACIN HCL) one tab by mouth two times a day   Prescriptions: CIPRO 500 MG TABS (CIPROFLOXACIN HCL) one tab by mouth two times a day  #28 x 0   Entered and Authorized by:   Shaune Leeks MD   Signed by:   Shaune Leeks MD on 04/13/2009   Method used:   Electronically to        [facility]* (retail)       [address]       [place], [place]  [address]       Ph: [phone]       Fax: [phone]   RxID:   [id number]

## 2010-12-29 NOTE — Letter (Signed)
Summary: [facility] / F/U HEADACHE, PTOSIS, & DIPLOPIA / D  [facility] / F/U HEADACHE, PTOSIS, & DIPLOPIA / DR. MICHAEL REYNOLDS   Imported By: Carin Primrose 06/18/2009 09:33:37  _____________________________________________________________________  External Attachment:    Type:   Image     Comment:   External Document

## 2010-12-29 NOTE — Progress Notes (Signed)
Summary: hydrocodone   Phone Note Refill Request Message from:  Fax from Pharmacy on October 28, 2010 2:38 PM  Refills Requested: Medication #1:  HYDROCODONE-ACETAMINOPHEN 2.5-500 MG TABS take 1 by mouth two times a day as needed pain   Last Refilled: 07/22/2010 Refil request from [facility].   Initial call taken by: Melody Comas,  October 28, 2010 2:39 PM  Follow-up for Phone Call        Again, the same as a wekk ago, 60/5 was called in 10/14. If not, refill for 60/4 refills. Thank you. Pt needs to be seen after the New Year. Follow-up by: Shaune Leeks MD,  October 28, 2010 4:30 PM  Additional Follow-up for Phone Call Additional follow up Details #1::        Spoke with patient, she says that the pharmacy keeps telling her that she doesn't have any more refills, but she thinks that she got it strait today, they filled it for her and told her that she has 3 refills left.  Additional Follow-up by: Melody Comas,  October 28, 2010 5:05 PM

## 2010-12-29 NOTE — Progress Notes (Signed)
Summary: shingles pain  Phone Note Call from Patient   Caller: Patient Call For: Dr. Lovell Sheehan Summary of Call: Pt is taking Darvocette one q 4 hours and is nauseated.  Her eye is almost closed and the pain from the Shingles is not relieved by the meds. She is taking the prednisone and Acyclovir, and has Gabopentin, but is not taking it. [phone] Initial call taken by: Lynann Beaver CMA,  January 28, 2009 9:52 AM  Follow-up for Phone Call        Per Dr. Lovell Sheehan.......she should notify her primary care MD, and make an appt. Follow-up by: Lynann Beaver CMA,  January 28, 2009 10:17 AM

## 2010-12-29 NOTE — Progress Notes (Signed)
Summary: needs something different for pain   Phone Note Call from Patient Call back at Home Phone [phone]   Caller: Patient Call For: Shaune Leeks MD Summary of Call: Patient was taking tramadol and she felt that it was making her heart skip a beat, then she was switched to hydrocodone and she says that it is too strong. Wants to know if she can have something else called in [facility].  Initial call taken by: Melody Comas,  January 17, 2010 1:44 PM  Follow-up for Phone Call        [facility], advised pt. Follow-up by: Lowella Petties CMA,  January 17, 2010 3:02 PM    New/Updated Medications: HYDROCODONE-ACETAMINOPHEN 2.5-500 MG TABS (HYDROCODONE-ACETAMINOPHEN) one tab by mouth daily for back pain as required. Prescriptions: HYDROCODONE-ACETAMINOPHEN 2.5-500 MG TABS (HYDROCODONE-ACETAMINOPHEN) one tab by mouth daily for back pain as required.  #30 x 0   Entered and Authorized by:   Shaune Leeks MD   Signed by:   Shaune Leeks MD on 01/17/2010   Method used:   Telephoned to ...       [facility]* (retail)       [address]       [place], [place]  [address]       Ph: [phone]       Fax: [phone]   RxID:   [id number]

## 2010-12-29 NOTE — Progress Notes (Signed)
Summary: [name]  Phone Note Refill Request Message from:  Fax from Pharmacy on September 30, 2009 4:00 PM  Refills Requested: Medication #1:  [name] 2.5-5 MG  CAPS 1 tab by mouth two times a day as needed [facility]  Phone:   [phone]   Method Requested: Electronic Initial call taken by: Delilah Shan CMA (AAMA),  September 30, 2009 4:00 PM  Follow-up for Phone Call        Rx called to pharmacy Follow-up by: Sydell Axon LPN,  September 30, 2009 4:36 PM    Prescriptions: [name] 2.5-5 MG  CAPS (CLIDINIUM-CHLORDIAZEPOXIDE) 1 tab by mouth two times a day as needed  #60 x 5   Entered and Authorized by:   Shaune Leeks MD   Signed by:   Shaune Leeks MD on 09/30/2009   Method used:   Telephoned to ...       [facility]* (retail)       [address]       [place], [place]  [address]       Ph: [phone]       Fax: [phone]   RxID:   [id number]

## 2010-12-29 NOTE — Assessment & Plan Note (Signed)
Summary: 1 MTH FU/CLE   Vital Signs:  Patient profile:   77 year old female Height:      64 inches Weight:      110 pounds Temp:     97.8 degrees F oral Pulse rate:   110 / minute Pulse rhythm:   regular BP sitting:   130 / 78  (left arm) Cuff size:   regular  Vitals Entered By: Providence Crosby (Mar 30, 2009 12:04 PM) CC: 58month followup// ?uti   History of Present Illness: Pt here for followup from last visit , treating her for Lack of appetite and sleep difficulty with depression as well by prescribing Remeron at low dose. Her depression actually seems a little better altho she perseverates on left eye difficulties with double vision from her bout with shingles. She is eating "some better" and sleeping better by her admission. She also c/o burning with urination and frequency which strated or may have been going on when seen by Urology after being here. She denies fever or chills. No real back pain.  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) 4)  Herpes Zoster, Left Temp[oral Area  (ICD-053.9) 5)  Gerd  (ICD-530.81) 6)  Uri  (ICD-465.9) 7)  Cervical Lymphadenopathy, Left  (ICD-785.6) 8)  Keratoacanthoma  (ICD-238.2) 9)  Abdominal Pain, Epigastric  (ICD-789.06) 10)  Interstitial Cystitis (DR EVANS)  (ICD-595.1) 11)  Back Pain, Chronic  (ICD-724.5) 12)  Disorder, Depressive Nec  (ICD-311) 13)  Thyroid Cancer, Hx of  (ICD-V10.87) 14)  Anorexia  (ICD-783.0) 15)  Irritable Bowel Syndrome  (ICD-564.1) 16)  Atrial Fibrillation, Paroxysmal  (ICD-427.31) 17)  Mitral Valve Prolapse  (ICD-424.0) 18)  Postmenopausal Status  (ICD-627.2) 19)  Colonic Polyps, Hx of  (ICD-V12.72) 20)  Allergic Rhinitis  (ICD-477.9) 21)  Sinusitis, Chronic  (ICD-473.9) 22)  Hypothyroidism  (ICD-244.9) 23)  Hypertension  (ICD-401.9) 24)  Hyperlipidemia  (ICD-272.4) 25)  Diverticulosis,  Colon  (ICD-562.10)  Medications Prior to Update: 1)  Synthroid 88 Mcg  Tabs (Levothyroxine Sodium) .Marland Kitchen.. 1 Daily By Mouth 2)  [name] 2.5-5 Mg  Caps (Clidinium-Chlordiazepoxide) .Marland Kitchen.. 1 Tab By Mouth Two Times A Day As Needed 3)  Darvocet-N 100 100-650 Mg  Tabs (Propoxyphene N-Apap) .... Two Times A Day Prn 4)  Lisinopril 10 Mg  Tabs (Lisinopril) .... One Tab By Mouth At Night 5)  Cvs Allergy Relief 10 Mg Tbdp (Loratadine) .Marland Kitchen.. 1 Daily As Needed 6)  Centrum Silver  Tabs (Multiple Vitamins-Minerals) .Marland Kitchen.. 1 Daily By Mouth 7)  Aciphex 20 Mg Tbec (Rabeprazole Sodium) .... One Tab 45 Mins Before Eating. 8)  Remeron 15 Mg Tabs (Mirtazapine) .... One Tab By Mouth At Night  Allergies: 1)  ! Penicillin G Sodium 2)  ! Cortisone Acetate (Cortisone Acetate) 3)  ! * Phenergan 4)  ! Gabapentin (Gabapentin)  Physical Exam  General:  alert.  thin, elderly female, NAD but voices being tired but looks good and brighter than last visit. Head:  normocephalic and atraumatic.   Eyes:  pupils reactive to light.  left medial rectus palsy, upper lid partially covering the globe, improved. Ears:  External ear exam shows no significant lesions or deformities.  Otoscopic examination reveals clear canals, tympanic membranes are intact bilaterally without bulging, retraction, inflammation or discharge. Hearing is grossly normal bilaterally. Nose:  External nasal examination shows no deformity or inflammation. Nasal mucosa are pink and moist  without lesions or exudates. Mouth:  Oral mucosa and oropharynx without lesions or exudates.  Teeth in good repair. Neck:  No deformities, masses, or tenderness noted. Chest Wall:  No deformities, masses, or tenderness noted. Lungs:  Normal respiratory effort, chest expands symmetrically. Lungs are clear to auscultation, no crackles or wheezes. Heart:  normal rate and regular rhythm.  no murmur, no gallop, and no JVD.   Abdomen:  no suprapubic tenderness. Msk:  No CVAT.    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  Problem # 4:  UTI (ICD-599.0) Assessment: New  Her updated medication list for this problem includes:    Macrobid 100 Mg Caps (Nitrofurantoin monohyd macro) ..... One tab by mouth two times a day  Encouraged to push clear liquids, get enough rest, and take acetaminophen as needed. To be seen in 10 days if no improvement, sooner if worse.  Orders: UA Dipstick W/ Micro (manual) (04540)  Complete Medication List: 1)  Synthroid 88 Mcg Tabs (Levothyroxine sodium) .Marland Kitchen.. 1 daily by mouth 2)  [name] 2.5-5 Mg Caps (Clidinium-chlordiazepoxide) .Marland Kitchen.. 1 tab by mouth two times a day as needed 3)  Darvocet-n 100 100-650 Mg Tabs (Propoxyphene n-apap) .... Two times a day prn 4)  Lisinopril 10 Mg Tabs (Lisinopril) .... One tab by mouth at night 5)  [facility]vs Allergy Relief 10 Mg Tbdp (Loratadine) .Marland Kitchen.. 1 daily as needed 6)  Centrum Silver Tabs (Multiple vitamins-minerals) .Marland Kitchen.. 1 daily by mouth 7)  Aciphex 20 Mg Tbec (Rabeprazole sodium) .... One tab 45 mins before eating. 8)  Remeron 15 Mg Tabs (Mirtazapine) .... One tab by mouth at night 9)  Nexium 40 Mg Cpdr (Esomeprazole magnesium) .Marland Kitchen.. 1 daily by mouth 10)  Macrobid 100 Mg Caps (Nitrofurantoin monohyd macro) .... One tab by mouth two times a day  Other Orders: T-Culture, Urine ([id number])  Patient Instructions: 1)  RTC as needed. Prescriptions: MACROBID 100 MG CAPS (NITROFURANTOIN MONOHYD MACRO) one tab by mouth two times a day  #20 x 0   Entered and Authorized by:   Shaune Leeks MD   Signed by:   Shaune Leeks MD on  03/30/2009   Method used:   Electronically to        [facility]* (retail)       [address]       [place], [place]  [address]       Ph: [phone]       Fax: [phone]   RxID:   [id number]     Laboratory Results   Urine Tests  Date/Time Recieved: Mar 30, 2009 12:02 PM Date/Time Reported: Mar 30, 2009 12:02 PM  Routine Urinalysis   Color: yellow Appearance: Hazy Glucose: negative   (Normal Range: Negative) Bilirubin: negative   (Normal Range: Negative) Ketone: negative   (Normal Range: Negative) Spec. Gravity: 1.010   (Normal Range: 1.003-1.035) Blood: small   (Normal Range: Negative) pH: 6.0   (Normal Range: 5.0-8.0) Protein: 30   (Normal Range: Negative) Urobilinogen: 0.2   (Normal Range: 0-1) Nitrite: negative   (Normal Range: Negative) Leukocyte Esterace: moderate   (Normal Range: Negative)  Urine Microscopic WBC/hpf: TNTC RBC/hpf: 0-1  Bacteria: 2+ Epithelial: Rare    Comments: complains of burning

## 2010-12-29 NOTE — Assessment & Plan Note (Signed)
Summary: HOSPITAL F/U DLO PER DR NORINS   Vital Signs:  Patient profile:   77 year old female Height:      64.5 inches Weight:      114 pounds BMI:     19.34 Temp:     97.5 degrees F oral Pulse rate:   80 / minute Pulse rhythm:   regular BP sitting:   120 / 80  (left arm) Cuff size:   regular  Vitals Entered By: Providence Crosby (February 23, 2009 10:42 AM)   History of Present Illness: Pt here with friend for followup of hospitalization for diverticulitis and Third nerve Palsy of the eye. Palsy presumably from vricella zoster ..had double vision initially which she thinks is resolved but her eyelid is totally drooped precluding vision from  the left eye. She is unable to raise the eye primarily. She has had some tearing. She was told to get a followup appt with Dr Thad Ranger after discharge from the hospital. She has not had abd pain since she left the hospital but continues feeling weak and washed out. She had acute diverticulitis that did not perforate but there was suspicion of abscess formation. She finished her antibiotics three days ago and has not had pain or change in BM since. She does have some perivaginal burning which she feels with urination. She has been using OTC cream which helps and overall has been slowly improving on its own.  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) 4)  Herpes Zoster, Left Temp[oral Area  (ICD-053.9) 5)  Gerd  (ICD-530.81) 6)  Uri  (ICD-465.9) 7)  Cervical Lymphadenopathy, Left  (ICD-785.6) 8)  Keratoacanthoma  (ICD-238.2) 9)  Abdominal Pain, Epigastric  (ICD-789.06) 10)  Interstitial Cystitis (DR EVANS)  (ICD-595.1) 11)  Back Pain, Chronic  (ICD-724.5) 12)  Disorder, Depressive Nec  (ICD-311) 13)  Thyroid Cancer, Hx of  (ICD-V10.87) 14)  Anorexia  (ICD-783.0) 15)  Irritable Bowel Syndrome  (ICD-564.1) 16)  Atrial Fibrillation,  Paroxysmal  (ICD-427.31) 17)  Mitral Valve Prolapse  (ICD-424.0) 18)  Postmenopausal Status  (ICD-627.2) 19)  Colonic Polyps, Hx of  (ICD-V12.72) 20)  Allergic Rhinitis  (ICD-477.9) 21)  Sinusitis, Chronic  (ICD-473.9) 22)  Hypothyroidism  (ICD-244.9) 23)  Hypertension  (ICD-401.9) 24)  Hyperlipidemia  (ICD-272.4) 25)  Diverticulosis, Colon  (ICD-562.10)  Medications Prior to Update: 1)  Synthroid 88 Mcg  Tabs (Levothyroxine Sodium) .Marland Kitchen.. 1 Daily By Mouth 2)  [name] 2.5-5 Mg  Caps (Clidinium-Chlordiazepoxide) .Marland Kitchen.. 1 Tab By Mouth Two Times A Day As Needed 3)  Darvocet-N 100 100-650 Mg  Tabs (Propoxyphene N-Apap) .... Two Times A Day Prn 4)  Lisinopril 10 Mg  Tabs (Lisinopril) .... One Tab By Mouth At Night 5)  Cvs Allergy Relief 10 Mg Tbdp (Loratadine) .Marland Kitchen.. 1 Daily As Needed 6)  Centrum Silver  Tabs (Multiple Vitamins-Minerals) .Marland Kitchen.. 1 Daily By Mouth 7)  Move Free Triple Strength .... 1 Daily By Mouth 8)  Nexium 20 Mg Cpdr (Esomeprazole Magnesium) .... One Tab By Mouth Twice A Day. 9)  Acyclovir 800 Mg Tabs (Acyclovir) .... One Tab Every Four Hours, Five Times A Day For Ten Days  Allergies: 1)  ! Penicillin G Sodium 2)  ! Cortisone Acetate (Cortisone Acetate) 3)  ! * Phenergan 4)  ! Gabapentin (Gabapentin)  Past History:  Past Medical History:    Diverticulosis, colon    Hyperlipidemia  Hypertension    Hypothyroidism     (02/15/2007)  Past Surgical History:    COLOSCOPY POLYP REMOVAL (1995)    Choleycystectomy, 1991    Colonosc divertics, int hemms, 1995    CT ABD AND PELVIS     THYROID R NODULE EXCISION Carcinoma (01/1994)    Total thyroidectomy, 04/1994 due to thyroid cancer    EGD HH, 1998    PELVIC U/S- NORMAL (03/2001)    ECHO Myxomatous thickening Mitral valve Mild MVP Tr MR Tr TR 05/23/2002    DEXA T-2.6 NECK OF HIP 3.5    COLONOSCOPY BX NEG (Dr Jarold Motto) (05/28/2006)    EGD (Dr Jarold Motto) (05/28/2006)    Hip pinning R de to Fracture of hip, 08/1993     Hysterectomy    CT HeadNo acute intracranial abnmlty, sinuses clear 01/18/2009    HOSP H/A Diploplia 2/26-2/27/2010    MRI/MRA Brain/Head Nml 01/22/2009    HOSP Abd Pain Diverticulitis UTI CN III Palsy  3/7-3/17/2010    CT Scan Abd Mild Dz   CT Scan Pelvis  Diverticulitis Poss 01/31/2009    CT Scan Abd No Chg   CT Scan Pelvis Diverticulitis  02/02/2009 (02/10/2009)  Risk Factors:    Alcohol Use: N/A    >5 drinks/d w/in last 3 months: N/A    Caffeine Use: N/A    Diet: N/A    Exercise: N/A  Risk Factors:    Smoking Status: N/A    Packs/Day: N/A    Cigars/wk: N/A    Pipe Use/wk: N/A    Cans of tobacco/wk: N/A    Passive Smoke Exposure: N/A  Physical Exam  General:  alert.  thin, elderly female, NAD but voices being tired. Head:  normocephalic and atraumatic.   Eyes:  pupils reactive to light.  left medial rectus palsy, upper lid totally drooped covring eye. Ears:  External ear exam shows no significant lesions or deformities.  Otoscopic examination reveals clear canals, tympanic membranes are intact bilaterally without bulging, retraction, inflammation or discharge. Hearing is grossly normal bilaterally. Nose:  External nasal examination shows no deformity or inflammation. Nasal mucosa are pink and moist without lesions or exudates. Mouth:  Oral mucosa and oropharynx without lesions or exudates.  Teeth in good repair. Neck:  No deformities, masses, or tenderness noted. Chest Wall:  No deformities, masses, or tenderness noted. Lungs:  Normal respiratory effort, chest expands symmetrically. Lungs are clear to auscultation, no crackles or wheezes. Heart:  normal rate and regular rhythm.  no murmur, no gallop, and no JVD.   Abdomen:  soft and non-tender.  no distention, no guarding, and no rigidity.  Nml BS. No evidence of prior LLQ tenderness. Msk:  normal ROM and no joint warmth.   Pulses:  R and L carotid,radial,femoral,dorsalis pedis and posterior tibial pulses are full and equal  bilaterally Extremities:  No clubbing, cyanosis, edema, or deformity noted with normal full range of motion of all joints.   Neurologic:  alert & oriented X3 and gait normal.   Skin:  Intact without suspicious lesions or rashes Cervical Nodes:  No lymphadenopathy noted Inguinal Nodes:  No significant adenopathy Psych:  Oriented X3, flat affect, and moderately anxious.     Impression & Recommendations:  Problem # 1:  DIVERTICULITIS, ACUTE (ICD-562.11) Assessment Improved  Appears resolved. Discussed risk of recurrence. Warned to return for prolonged LLQ Pain. Pt is to see Dr Corliss Skains again to discuss partial colectomy. Pt wants to  avoid at all costs. Would want second opinion with  Dr Jarold Motto before commitiing to surgery. Told pt to discuss with Dr Corliss Skains when seen. Will check metabolites, liver function and blood count today. Orders: TLB-BMP (Basic Metabolic Panel-BMET) (80048-METABOL) TLB-CBC Platelet - w/Differential (85025-CBCD) TLB-Hepatic/Liver Function Pnl (80076-HEPATIC)  Problem # 2:  MULTIPLE CRANIAL NERVE PALSIES (ICD-352.6) Assessment: Unchanged Will try to get f/u appt w/ Dr Thad Ranger for the pt. Orders: Neurology Referral (Neuro)  Problem # 3:  HYPERTENSION (ICD-401.9) Assessment: Improved  Her updated medication list for this problem includes:    Lisinopril 10 Mg Tabs (Lisinopril) ..... One tab by mouth at night  BP today: 120/80 Prior BP: 140/70 (01/29/2009)  Complete Medication List: 1)  Synthroid 88 Mcg Tabs (Levothyroxine sodium) .Marland Kitchen.. 1 daily by mouth 2)  [name] 2.5-5 Mg Caps (Clidinium-chlordiazepoxide) .Marland Kitchen.. 1 tab by mouth two times a day as needed 3)  Darvocet-n 100 100-650 Mg Tabs (Propoxyphene n-apap) .... Two times a day prn 4)  Lisinopril 10 Mg Tabs (Lisinopril) .... One tab by mouth at night 5)  Cvs Allergy Relief 10 Mg Tbdp (Loratadine) .Marland Kitchen.. 1 daily as needed 6)  Centrum Silver Tabs (Multiple vitamins-minerals) .Marland Kitchen.. 1 daily by mouth 7)  Move Free  Triple Strength  .... 1 daily by mouth 8)  Nexium 20 Mg Cpdr (Esomeprazole magnesium) .... One tab by mouth twice a day. 9)  Cipro 500 Mg Tabs (Ciprofloxacin hcl) .Marland Kitchen.. 1 bid 10)  Metronidazole 500 Mg Tabs (Metronidazole)  Patient Instructions: 1)  Refer to Neurology 2)  RTC one month.  Laboratory Results   Urine Tests  Date/Time Recieved: February 23, 2009 10:48 AM Date/Time Reported: February 23, 2009 10:48 AM  Routine Urinalysis   Color: yellow Appearance: Clear Glucose: negative   (Normal Range: Negative) Bilirubin: negative   (Normal Range: Negative) Ketone: negative   (Normal Range: Negative) Spec. Gravity: <1.005   (Normal Range: 1.003-1.035) Blood: negative   (Normal Range: Negative) pH: 6.0   (Normal Range: 5.0-8.0) Protein: negative   (Normal Range: Negative) Urobilinogen: 0.2   (Normal Range: 0-1) Nitrite: negative   (Normal Range: Negative) Leukocyte Esterace: negative   (Normal Range: Negative)    Comments: complains of burning with urine

## 2010-12-29 NOTE — Assessment & Plan Note (Signed)
Summary: FLU SHOT/CLE   Nurse Visit   Allergies: 1)  ! Penicillin G Sodium 2)  ! Cortisone Acetate (Cortisone Acetate) 3)  ! * Phenergan 4)  ! Gabapentin (Gabapentin)  Immunizations Administered:  Influenza Vaccine # 1:    Vaccine Type: Fluvax 3+    Site: left deltoid    Mfr: GlaxoSmithKline    Dose: 0.5 ml    Route: IM    Given by: Mervin Hack CMA (AAMA)    Exp. Date: 05/26/2010    Lot #: [id number]    VIS given: 06/20/07 version given August 25, 2009.  Flu Vaccine Consent Questions:    Do you have a history of severe allergic reactions to this vaccine? no    Any prior history of allergic reactions to egg and/or gelatin? no    Do you have a sensitivity to the preservative Thimersol? no    Do you have a past history of Guillan-Barre Syndrome? no    Do you currently have an acute febrile illness? no    Have you ever had a severe reaction to latex? no    Vaccine information given and explained to patient? yes    Are you currently pregnant? no  Orders Added: 1)  Flu Vaccine 21yrs + [90658] 2)  Admin 1st Vaccine [14782]

## 2010-12-29 NOTE — Progress Notes (Signed)
Summary: hydrocodone  Phone Note Refill Request Message from:  Fax from Pharmacy on October 17, 2010 11:25 AM  Refills Requested: Medication #1:  HYDROCODONE-ACETAMINOPHEN 2.5-500 MG TABS take 1 by mouth two times a day as needed pain   Last Refilled: 07/22/2010 Refill request from [facility]. [phone].   Initial call taken by: Melody Comas,  October 17, 2010 11:25 AM  Follow-up for Phone Call        Pt was given script for 60 and 5 refills last month. She should not need another. Follow-up by: Shaune Leeks MD,  October 17, 2010 11:41 AM  Additional Follow-up for Phone Call Additional follow up Details #1::        Patient doesn't need refill at this time.  Additional Follow-up by: Melody Comas,  October 17, 2010 11:45 AM

## 2010-12-29 NOTE — Progress Notes (Signed)
Summary: prior auth needed for aciphex  Phone Note From Pharmacy   Caller: [facility]*/ [employer] Call For: dr Savannaha Stonerock  Summary of Call: Prior auth is needed for aciphex, form is on your shelf. Initial call taken by: Lowella Petties,  March 23, 2009 9:14 AM  Follow-up for Phone Call        Signed. Follow-up by: Shaune Leeks MD,  March 23, 2009 1:57 PM  Additional Follow-up for Phone Call Additional follow up Details #1::        Faxed form. Additional Follow-up by: Lowella Petties,  March 23, 2009 2:48 PM

## 2011-01-16 ENCOUNTER — Encounter (INDEPENDENT_AMBULATORY_CARE_PROVIDER_SITE_OTHER): Payer: Self-pay | Admitting: *Deleted

## 2011-01-24 NOTE — Letter (Signed)
Summary: New Patient letter  [facility]  [address] [place], [place] [address]   Phone: [phone]  Fax: [phone]       01/16/2011 MRN: [id number]  [facility] [address] [place], [place]  [address]  Dear Ms. Taussig,  Welcome to the Gastroenterology Division at [facility].    You are scheduled to see Dr.  Jarold Motto on 02-21-11 at 1:30P.M. on the 3rd floor at [facility], [address]  We ask that you try to arrive at our office 15 minutes prior to your appointment time to allow for check-in.  We would like you to complete the enclosed self-administered evaluation form prior to your visit and bring it with you on the day of your appointment.  We will review it with you.  Also, please bring a complete list of all your medications or, if you prefer, bring the medication bottles and we will list them.  Please bring your insurance card so that we may make a copy of it.  If your insurance requires a referral to see a specialist, please bring your referral form from your primary care physician.  Co-payments are due at the time of your visit and may be paid by cash, check or credit card.     Your office visit will consist of a consult with your physician (includes a physical exam), any laboratory testing he/she may order, scheduling of any necessary diagnostic testing (e.g. x-ray, ultrasound, CT-scan), and scheduling of a procedure (e.g. Endoscopy, Colonoscopy) if required.  Please allow enough time on your schedule to allow for any/all of these possibilities.    If you cannot keep your appointment, please call [phone] to cancel or reschedule prior to your appointment date.  This allows [name] the opportunity to schedule an appointment for another patient in need of care.  If you do not cancel or reschedule by 5 p.m. the business day prior to your appointment date, you will be charged a $50.00 late cancellation/no-show fee.    Thank you  for choosing [facility] for your medical needs.  We appreciate the opportunity to care for you.  Please visit [name] at our website  to learn more about our practice.                     Sincerely,                                                             The Gastroenterology Division

## 2011-01-30 ENCOUNTER — Other Ambulatory Visit: Payer: Self-pay | Admitting: Family Medicine

## 2011-01-30 DIAGNOSIS — Z1231 Encounter for screening mammogram for malignant neoplasm of breast: Secondary | ICD-10-CM

## 2011-02-03 ENCOUNTER — Ambulatory Visit (HOSPITAL_COMMUNITY)
Admission: RE | Admit: 2011-02-03 | Discharge: 2011-02-03 | Disposition: A | Discharge status: Home / Self Care | Payer: Medicaid Other | Source: Ambulatory Visit | Attending: Family Medicine | Admitting: Family Medicine

## 2011-02-03 DIAGNOSIS — Z1231 Encounter for screening mammogram for malignant neoplasm of breast: Secondary | ICD-10-CM

## 2011-02-06 ENCOUNTER — Encounter (INDEPENDENT_AMBULATORY_CARE_PROVIDER_SITE_OTHER): Payer: Self-pay | Admitting: *Deleted

## 2011-02-14 NOTE — Letter (Signed)
Summary: Results Follow up Letter  [place] [facility]  [address] [place], [place] [address]   Phone: [phone]  Fax: [phone]    02/06/2011 MRN: [id number]    [facility] [address] [place], [place]  [address]    Dear [name],  The following are the results of your recent test(s):  Test         Result    Pap Smear:        Normal _____  Not Normal _____ Comments: ______________________________________________________ Cholesterol: LDL(Bad cholesterol):         Your goal is less than:         HDL (Good cholesterol):       Your goal is more than: Comments:  ______________________________________________________ Mammogram:        Normal __X___  Not Normal _____ Comments:  Yearly follow up is recommended.   ___________________________________________________________________ Hemoccult:        Normal _____  Not normal _______ Comments:    _____________________________________________________________________ Other Tests:    We routinely do not discuss normal results over the telephone.  If you desire a copy of the results, or you have any questions about this information we can discuss them at your next office visit.   Sincerely,    Dwana Curd. Para March, M.D.  [facility]

## 2011-02-21 ENCOUNTER — Other Ambulatory Visit (INDEPENDENT_AMBULATORY_CARE_PROVIDER_SITE_OTHER): Payer: Medicaid Other

## 2011-02-21 ENCOUNTER — Ambulatory Visit (INDEPENDENT_AMBULATORY_CARE_PROVIDER_SITE_OTHER): Payer: Medicaid Other | Admitting: Gastroenterology

## 2011-02-21 ENCOUNTER — Encounter: Payer: Self-pay | Admitting: Gastroenterology

## 2011-02-21 DIAGNOSIS — D485 Neoplasm of uncertain behavior of skin: Secondary | ICD-10-CM

## 2011-02-21 DIAGNOSIS — F3289 Other specified depressive episodes: Secondary | ICD-10-CM

## 2011-02-21 DIAGNOSIS — R1930 Abdominal rigidity, unspecified site: Secondary | ICD-10-CM

## 2011-02-21 DIAGNOSIS — F329 Major depressive disorder, single episode, unspecified: Secondary | ICD-10-CM

## 2011-02-21 DIAGNOSIS — K219 Gastro-esophageal reflux disease without esophagitis: Secondary | ICD-10-CM

## 2011-02-21 DIAGNOSIS — K5730 Diverticulosis of large intestine without perforation or abscess without bleeding: Secondary | ICD-10-CM

## 2011-02-21 DIAGNOSIS — I10 Essential (primary) hypertension: Secondary | ICD-10-CM

## 2011-02-21 DIAGNOSIS — Z79899 Other long term (current) drug therapy: Secondary | ICD-10-CM

## 2011-02-21 DIAGNOSIS — E782 Mixed hyperlipidemia: Secondary | ICD-10-CM

## 2011-02-21 DIAGNOSIS — K5900 Constipation, unspecified: Secondary | ICD-10-CM

## 2011-02-21 LAB — HEPATIC FUNCTION PANEL
ALT: 14 U/L (ref 0–35)
AST: 22 U/L (ref 0–37)
Albumin: 4 g/dL (ref 3.5–5.2)
Alkaline Phosphatase: 89 U/L (ref 39–117)
Bilirubin, Direct: 0.1 mg/dL (ref 0.0–0.3)
Total Bilirubin: 0.3 mg/dL (ref 0.3–1.2)
Total Protein: 7.4 g/dL (ref 6.0–8.3)

## 2011-02-21 LAB — BASIC METABOLIC PANEL
BUN: 12 mg/dL (ref 6–23)
CO2: 33 mEq/L — ABNORMAL HIGH (ref 19–32)
Calcium: 9 mg/dL (ref 8.4–10.5)
Chloride: 105 mEq/L (ref 96–112)
Creatinine, Ser: 0.9 mg/dL (ref 0.4–1.2)
GFR: 61.12 mL/min (ref >60.00)
Glucose, Bld: 79 mg/dL (ref 70–99)
Potassium: 4.5 mEq/L (ref 3.5–5.1)
Sodium: 142 mEq/L (ref 135–145)

## 2011-02-21 LAB — CBC WITH DIFFERENTIAL/PLATELET
Basophils Absolute: 0 10*3/uL (ref 0.0–0.1)
Basophils Relative: 0.3 % (ref 0.0–3.0)
Eosinophils Absolute: 0.1 10*3/uL (ref 0.0–0.7)
Eosinophils Relative: 2.1 % (ref 0.0–5.0)
HCT: 39.3 % (ref 36.0–46.0)
Hemoglobin: 13.6 g/dL (ref 12.0–15.0)
Lymphocytes Relative: 37.6 % (ref 12.0–46.0)
Lymphs Abs: 2.3 10*3/uL (ref 0.7–4.0)
MCHC: 34.6 g/dL (ref 30.0–36.0)
MCV: 91.5 fl (ref 78.0–100.0)
Monocytes Absolute: 0.4 10*3/uL (ref 0.1–1.0)
Monocytes Relative: 6.4 % (ref 3.0–12.0)
Neutro Abs: 3.2 10*3/uL (ref 1.4–7.7)
Neutrophils Relative %: 53.6 % (ref 43.0–77.0)
Platelets: 227 10*3/uL (ref 150.0–400.0)
RBC: 4.3 Mil/uL (ref 3.87–5.11)
RDW: 12.4 % (ref 11.5–14.6)
WBC: 6.1 10*3/uL (ref 4.5–10.5)

## 2011-02-21 LAB — SEDIMENTATION RATE: Sed Rate: 22 mm/hr (ref 0–22)

## 2011-02-21 LAB — IGA: IgA: 292 mg/dL (ref 68–378)

## 2011-02-21 LAB — IBC PANEL
Iron: 123 ug/dL (ref 42–145)
Saturation Ratios: 29.5 % (ref 20.0–50.0)
Transferrin: 297.6 mg/dL (ref 212.0–360.0)

## 2011-02-21 LAB — TSH: TSH: 0.78 u[IU]/mL (ref 0.35–5.50)

## 2011-02-21 LAB — FOLATE: Folate: 14.8 ng/mL (ref >5.9)

## 2011-02-21 LAB — VITAMIN B12: Vitamin B-12: 354 pg/mL (ref 211–911)

## 2011-02-21 LAB — FERRITIN: Ferritin: 31.7 ng/mL (ref 10.0–291.0)

## 2011-02-21 MED ORDER — LUBIPROSTONE 8 MCG PO CAPS: 8.0000 ug | ORAL_CAPSULE | Freq: Two times a day (BID) | ORAL | Status: AC

## 2011-02-21 MED ORDER — ALIGN PO CAPS: 1.0000 | ORAL_CAPSULE | Freq: Every day | ORAL | Status: AC

## 2011-02-21 MED ORDER — PEG-KCL-NACL-NASULF-NA ASC-C 100 G PO SOLR: 1.0000 | Freq: Once | ORAL | Status: AC

## 2011-02-21 NOTE — Patient Instructions (Addendum)
Your procedure has been scheduled for 03/29/2011, please follow the seperate instructions.  Try the samples of Align and Amitiza if the Amitiza helps please call back and we will send you an rx. Please go to the basement today for your labs.

## 2011-02-21 NOTE — Progress Notes (Signed)
History of Present Illness:  This is a  77 year old Caucasian female that followed for several years constipation predominant IBS. She now has increasing constipation with mild gas and bloating and vague lower abdominal discomfort. One month ago she had several weeks of diarrhea which seemed to abate with over-the-counter Imodium. She's had no rectal bleeding, melena, upper gastrointestinal or hepatobiliary complaints. Her appetite is good her weight has been stable. She takes daily fiber supplements and MiraLax at bedtime. Thorough review of her records over  many years shows multiple GI procedures without a definite diagnosis. Was hospitalized in March of 2010  acute diverticulitis confirmed by CT scan. Last endoscopy and colonoscopy were in 2010. Previous workup for celiac disease and bacterial overgrowth syndrome was negative.   Past Medical History  Diagnosis Date  . Personal history of colonic polyps 1995  . Diverticulosis of colon (without mention of hemorrhage) 1995  . Thyroid carcinoma 01/1994  . Hyperlipemia   . Hypertension   . Hypothyroidism    Past Surgical History  Procedure Date  . Thyroidectomy 04/1994    thyroid cancer  . Cholecystectomy   . Hysterotomy   . Hip surgery     Broken hip    reports that she has never smoked. She does not have any smokeless tobacco history on file. She reports that she does not drink alcohol or use illicit drugs. family history includes Cancer in her brother; Diabetes in her brother and sister; Heart disease in her brother; Hypertension in her mother; and Stroke in her father. Allergies  Allergen Reactions  . Cortisone Acetate     REACTION: nausea  . Gabapentin     REACTION: severe dizziness  . Penicillins     REACTION: pass out  . Promethazine Hcl     REACTION: unspecified  . Reglan        ROS: The remainder of the 10 point ROS is negative. Overall she seems to be doing extremely well with her chronic anxiety and depression  And  other problems review of her problem list. She is followed closely by primary care. She denies current cardiovascular or pulmonary complaints or any symptoms of thyroid dysfunction. Her appetite is good and her weight is stable and she denies any specific food intolerances. She also has a history of atrial fibrillation but denies palpitations or chest pain. She has a history of multiple drug allergies including metoclopramide.    Physical Exam: General well developed well nourished patient in no acute distress, appearing their stated age Eyes PERRLA, no icterus fundoscopic exam per opthamologist Skin no lesions noted Neck supple, no adenopathy, no thyroid enlargement, no tenderness Chest clear to percussion and auscultation Heart no significant murmurs, gallops or rubs noted Abdomen no hepatosplenomegaly masses or tenderness, BS normal.  Rectal inspection normal no fissures, or fistulae noted.  No masses or tenderness on digital exam. Stool guaiac negative. Extremities no acute joint lesions, edema, phlebitis or evidence of cellulitis. Neurologic patient oriented x 3, cranial nerves intact, no focal neurologic deficits noted. Psychological mental status normal and normal affect.   Assessment and plan: constipation predominant IBS with possible colonic stricturing from recurrent diverticulitis. Scheduled colonoscopy with propofol sedation and we'll check screening laboratory parameters. She is to continue her current regime and I have added  Amitiza 8 mcg twice a day. Also we'll give her a trial of probiotic therapy. She does have a past history of colon polyps additionally. Also there is a history of previous pelvic surgery and possible pelvic-  colonic adhesions. Her chronic anxiety and depression seems to be doing well at this time.

## 2011-02-21 NOTE — Assessment & Plan Note (Signed)
She denies active reflux problems at this time taking omeprazole 40 mg a day.

## 2011-02-21 NOTE — Assessment & Plan Note (Signed)
improved

## 2011-02-22 ENCOUNTER — Telehealth: Payer: Self-pay | Admitting: *Deleted

## 2011-02-22 DIAGNOSIS — K900 Celiac disease: Secondary | ICD-10-CM

## 2011-02-22 LAB — GLIA (IGA/G) + TTG IGA
Gliadin IgA: 39.7 U/mL — ABNORMAL HIGH (ref <20)
Gliadin IgG: 54.8 U/mL — ABNORMAL HIGH (ref <20)
Tissue Transglutaminase Ab, IgA: 23.6 U/mL — ABNORMAL HIGH (ref <20)

## 2011-02-22 NOTE — Telephone Encounter (Signed)
Message copied by Harlow Mares on Wed Feb 22, 2011  3:40 PM ------      Message from: Jarold Motto, DAVID      Created: Wed Feb 22, 2011  3:17 PM       SHE HAS CELIAC DISEASE...NEEDS SMALL BOWEL BX. AND ENDO ALSO...START GLUTEN FREE DIET.Marland KitchenMarland Kitchen

## 2011-02-22 NOTE — Telephone Encounter (Signed)
Left a message on patients machine to call back if she has questions, but she will need egd with her colonoscopy and i will mail out a celiac diet.

## 2011-03-02 ENCOUNTER — Other Ambulatory Visit: Payer: Self-pay | Admitting: *Deleted

## 2011-03-02 MED ORDER — LISINOPRIL 10 MG PO TABS: 10.0000 mg | ORAL_TABLET | Freq: Every evening | ORAL | Status: DC

## 2011-03-07 ENCOUNTER — Other Ambulatory Visit: Payer: Self-pay | Admitting: *Deleted

## 2011-03-07 MED ORDER — LEVOTHYROXINE SODIUM 88 MCG PO TABS: 88.0000 ug | ORAL_TABLET | Freq: Every day | ORAL | Status: DC

## 2011-03-07 MED ORDER — CILIDINIUM-CHLORDIAZEPOXIDE 2.5-5 MG PO CAPS: 1.0000 | ORAL_CAPSULE | Freq: Two times a day (BID) | ORAL | Status: DC | PRN

## 2011-03-07 MED ORDER — OMEPRAZOLE 40 MG PO CPDR: 40.0000 mg | DELAYED_RELEASE_CAPSULE | Freq: Every day | ORAL | Status: DC

## 2011-03-07 MED ORDER — MIRTAZAPINE 15 MG PO TABS: 15.0000 mg | ORAL_TABLET | Freq: Every day | ORAL | Status: DC

## 2011-03-09 LAB — CBC
HCT: 31.5 % — ABNORMAL LOW (ref 36.0–46.0)
HCT: 34.7 % — ABNORMAL LOW (ref 36.0–46.0)
HCT: 43 % (ref 36.0–46.0)
Hemoglobin: 11 g/dL — ABNORMAL LOW (ref 12.0–15.0)
Hemoglobin: 11.8 g/dL — ABNORMAL LOW (ref 12.0–15.0)
Hemoglobin: 14.7 g/dL (ref 12.0–15.0)
MCHC: 33.7 g/dL (ref 30.0–36.0)
MCHC: 34.3 g/dL (ref 30.0–36.0)
MCHC: 35.1 g/dL (ref 30.0–36.0)
MCV: 94.7 fL (ref 78.0–100.0)
MCV: 94.9 fL (ref 78.0–100.0)
MCV: 96.5 fL (ref 78.0–100.0)
Platelets: 175 10*3/uL (ref 150–400)
Platelets: 193 10*3/uL (ref 150–400)
Platelets: 272 10*3/uL (ref 150–400)
RBC: 3.32 MIL/uL — ABNORMAL LOW (ref 3.87–5.11)
RBC: 3.6 MIL/uL — ABNORMAL LOW (ref 3.87–5.11)
RBC: 4.55 MIL/uL (ref 3.87–5.11)
RDW: 12.7 % (ref 11.5–15.5)
RDW: 12.8 % (ref 11.5–15.5)
RDW: 12.8 % (ref 11.5–15.5)
WBC: 11.8 10*3/uL — ABNORMAL HIGH (ref 4.0–10.5)
WBC: 14.6 10*3/uL — ABNORMAL HIGH (ref 4.0–10.5)
WBC: 8.9 10*3/uL (ref 4.0–10.5)

## 2011-03-09 LAB — DIFFERENTIAL
Basophils Absolute: 0 10*3/uL (ref 0.0–0.1)
Basophils Absolute: 0 10*3/uL (ref 0.0–0.1)
Basophils Relative: 0 % (ref 0–1)
Basophils Relative: 0 % (ref 0–1)
Eosinophils Absolute: 0.1 10*3/uL (ref 0.0–0.7)
Eosinophils Absolute: 0.1 10*3/uL (ref 0.0–0.7)
Eosinophils Relative: 1 % (ref 0–5)
Eosinophils Relative: 1 % (ref 0–5)
Lymphocytes Relative: 12 % (ref 12–46)
Lymphocytes Relative: 16 % (ref 12–46)
Lymphs Abs: 1.4 10*3/uL (ref 0.7–4.0)
Lymphs Abs: 1.7 10*3/uL (ref 0.7–4.0)
Monocytes Absolute: 0.7 10*3/uL (ref 0.1–1.0)
Monocytes Absolute: 0.9 10*3/uL (ref 0.1–1.0)
Monocytes Relative: 6 % (ref 3–12)
Monocytes Relative: 8 % (ref 3–12)
Neutro Abs: 12 10*3/uL — ABNORMAL HIGH (ref 1.7–7.7)
Neutro Abs: 6.7 10*3/uL (ref 1.7–7.7)
Neutrophils Relative %: 75 % (ref 43–77)
Neutrophils Relative %: 82 % — ABNORMAL HIGH (ref 43–77)

## 2011-03-09 LAB — BASIC METABOLIC PANEL
BUN: 10 mg/dL (ref 6–23)
BUN: 2 mg/dL — ABNORMAL LOW (ref 6–23)
CO2: 29 mEq/L (ref 19–32)
CO2: 29 mEq/L (ref 19–32)
Calcium: 7.9 mg/dL — ABNORMAL LOW (ref 8.4–10.5)
Calcium: 7.9 mg/dL — ABNORMAL LOW (ref 8.4–10.5)
Chloride: 101 mEq/L (ref 96–112)
Chloride: 105 mEq/L (ref 96–112)
Creatinine, Ser: 0.77 mg/dL (ref 0.4–1.2)
Creatinine, Ser: 0.78 mg/dL (ref 0.4–1.2)
GFR calc Af Amer: >60 mL/min (ref >60)
GFR calc Af Amer: >60 mL/min (ref >60)
GFR calc non Af Amer: >60 mL/min (ref >60)
GFR calc non Af Amer: >60 mL/min (ref >60)
Glucose, Bld: 106 mg/dL — ABNORMAL HIGH (ref 70–99)
Glucose, Bld: 107 mg/dL — ABNORMAL HIGH (ref 70–99)
Potassium: 3.9 mEq/L (ref 3.5–5.1)
Potassium: 4.2 mEq/L (ref 3.5–5.1)
Sodium: 134 mEq/L — ABNORMAL LOW (ref 135–145)
Sodium: 138 mEq/L (ref 135–145)

## 2011-03-09 LAB — URINALYSIS, ROUTINE W REFLEX MICROSCOPIC
Bilirubin Urine: NEGATIVE
Glucose, UA: NEGATIVE mg/dL
Nitrite: NEGATIVE
Protein, ur: NEGATIVE mg/dL
Specific Gravity, Urine: >1.046 — ABNORMAL HIGH (ref 1.005–1.030)
Urobilinogen, UA: 0.2 mg/dL (ref 0.0–1.0)
pH: 6 (ref 5.0–8.0)

## 2011-03-09 LAB — COMPREHENSIVE METABOLIC PANEL
ALT: 19 U/L (ref 0–35)
AST: 22 U/L (ref 0–37)
Albumin: 3.7 g/dL (ref 3.5–5.2)
Alkaline Phosphatase: 89 U/L (ref 39–117)
BUN: 12 mg/dL (ref 6–23)
CO2: 29 mEq/L (ref 19–32)
Calcium: 9 mg/dL (ref 8.4–10.5)
Chloride: 97 mEq/L (ref 96–112)
Creatinine, Ser: 0.91 mg/dL (ref 0.4–1.2)
GFR calc Af Amer: >60 mL/min (ref >60)
GFR calc non Af Amer: >60 mL/min (ref >60)
Glucose, Bld: 113 mg/dL — ABNORMAL HIGH (ref 70–99)
Potassium: 3.4 mEq/L — ABNORMAL LOW (ref 3.5–5.1)
Sodium: 135 mEq/L (ref 135–145)
Total Bilirubin: 1.1 mg/dL (ref 0.3–1.2)
Total Protein: 7.2 g/dL (ref 6.0–8.3)

## 2011-03-09 LAB — URINE MICROSCOPIC-ADD ON

## 2011-03-09 LAB — LIPASE, BLOOD: Lipase: 15 U/L (ref 11–59)

## 2011-03-14 LAB — CBC
HCT: 39.5 % (ref 36.0–46.0)
Hemoglobin: 13.6 g/dL (ref 12.0–15.0)
MCHC: 34.3 g/dL (ref 30.0–36.0)
MCV: 93.7 fL (ref 78.0–100.0)
Platelets: 227 10*3/uL (ref 150–400)
RBC: 4.21 MIL/uL (ref 3.87–5.11)
RDW: 11.9 % (ref 11.5–15.5)
WBC: 7.5 10*3/uL (ref 4.0–10.5)

## 2011-03-14 LAB — COMPREHENSIVE METABOLIC PANEL
ALT: 13 U/L (ref 0–35)
AST: 19 U/L (ref 0–37)
Albumin: 3.7 g/dL (ref 3.5–5.2)
Alkaline Phosphatase: 81 U/L (ref 39–117)
BUN: 9 mg/dL (ref 6–23)
CO2: 28 mEq/L (ref 19–32)
Calcium: 8.7 mg/dL (ref 8.4–10.5)
Chloride: 102 mEq/L (ref 96–112)
Creatinine, Ser: 0.87 mg/dL (ref 0.4–1.2)
GFR calc Af Amer: >60 mL/min (ref >60)
GFR calc non Af Amer: >60 mL/min (ref >60)
Glucose, Bld: 94 mg/dL (ref 70–99)
Potassium: 4 mEq/L (ref 3.5–5.1)
Sodium: 137 mEq/L (ref 135–145)
Total Bilirubin: 0.6 mg/dL (ref 0.3–1.2)
Total Protein: 6.4 g/dL (ref 6.0–8.3)

## 2011-03-14 LAB — DIFFERENTIAL
Basophils Absolute: 0.1 10*3/uL (ref 0.0–0.1)
Basophils Relative: 1 % (ref 0–1)
Eosinophils Absolute: 0 10*3/uL (ref 0.0–0.7)
Eosinophils Relative: 0 % (ref 0–5)
Lymphocytes Relative: 18 % (ref 12–46)
Lymphs Abs: 1.3 10*3/uL (ref 0.7–4.0)
Monocytes Absolute: 0.3 10*3/uL (ref 0.1–1.0)
Monocytes Relative: 5 % (ref 3–12)
Neutro Abs: 5.7 10*3/uL (ref 1.7–7.7)
Neutrophils Relative %: 76 % (ref 43–77)

## 2011-03-14 LAB — BASIC METABOLIC PANEL
BUN: 11 mg/dL (ref 6–23)
CO2: 28 mEq/L (ref 19–32)
Calcium: 9 mg/dL (ref 8.4–10.5)
Chloride: 102 mEq/L (ref 96–112)
Creatinine, Ser: 0.89 mg/dL (ref 0.4–1.2)
GFR calc Af Amer: >60 mL/min (ref >60)
GFR calc non Af Amer: >60 mL/min (ref >60)
Glucose, Bld: 116 mg/dL — ABNORMAL HIGH (ref 70–99)
Potassium: 3.8 mEq/L (ref 3.5–5.1)
Sodium: 138 mEq/L (ref 135–145)

## 2011-03-14 LAB — C-REACTIVE PROTEIN: CRP: 0.1 mg/dL — ABNORMAL LOW (ref <0.6)

## 2011-03-14 LAB — SEDIMENTATION RATE
Sed Rate: 11 mm/hr (ref 0–22)
Sed Rate: 18 mm/hr (ref 0–22)

## 2011-03-14 LAB — APTT: aPTT: 26 seconds (ref 24–37)

## 2011-03-14 LAB — RPR: RPR Ser Ql: NONREACTIVE

## 2011-03-14 LAB — GLUCOSE, CAPILLARY: Glucose-Capillary: 97 mg/dL (ref 70–99)

## 2011-03-14 LAB — PROTIME-INR
INR: 1.1 (ref 0.00–1.49)
Prothrombin Time: 14.3 seconds (ref 11.6–15.2)

## 2011-03-14 LAB — HSV(HERPES SMPLX)ABS-I+II(IGG+IGM)-BLD
Herpes Simplex Vrs I + II Ab, IgG: 45.6 IV — ABNORMAL HIGH
Herpes Simplex Vrs I&II-IgM Ab (EIA): 0.81 INDEX

## 2011-03-28 ENCOUNTER — Encounter: Payer: Self-pay | Admitting: Gastroenterology

## 2011-03-29 ENCOUNTER — Ambulatory Visit (AMBULATORY_SURGERY_CENTER): Payer: Medicaid Other | Admitting: Gastroenterology

## 2011-03-29 ENCOUNTER — Encounter: Payer: Self-pay | Admitting: Gastroenterology

## 2011-03-29 VITALS — BP 159/84 | HR 83 | Temp 98.2°F | Resp 18 | Ht 64.0 in | Wt 130.0 lb

## 2011-03-29 DIAGNOSIS — R52 Pain, unspecified: Secondary | ICD-10-CM

## 2011-03-29 DIAGNOSIS — K635 Polyp of colon: Secondary | ICD-10-CM

## 2011-03-29 DIAGNOSIS — K2970 Gastritis, unspecified, without bleeding: Secondary | ICD-10-CM

## 2011-03-29 DIAGNOSIS — R109 Unspecified abdominal pain: Secondary | ICD-10-CM

## 2011-03-29 DIAGNOSIS — K449 Diaphragmatic hernia without obstruction or gangrene: Secondary | ICD-10-CM

## 2011-03-29 DIAGNOSIS — K2990 Gastroduodenitis, unspecified, without bleeding: Secondary | ICD-10-CM

## 2011-03-29 DIAGNOSIS — K2270 Barrett's esophagus without dysplasia: Secondary | ICD-10-CM

## 2011-03-29 DIAGNOSIS — K589 Irritable bowel syndrome without diarrhea: Secondary | ICD-10-CM

## 2011-03-29 DIAGNOSIS — K900 Celiac disease: Secondary | ICD-10-CM

## 2011-03-29 DIAGNOSIS — K219 Gastro-esophageal reflux disease without esophagitis: Secondary | ICD-10-CM

## 2011-03-29 DIAGNOSIS — K5790 Diverticulosis of intestine, part unspecified, without perforation or abscess without bleeding: Secondary | ICD-10-CM

## 2011-03-29 DIAGNOSIS — R1013 Epigastric pain: Secondary | ICD-10-CM

## 2011-03-29 DIAGNOSIS — K2980 Duodenitis without bleeding: Secondary | ICD-10-CM

## 2011-03-29 DIAGNOSIS — R197 Diarrhea, unspecified: Secondary | ICD-10-CM

## 2011-03-29 DIAGNOSIS — K5730 Diverticulosis of large intestine without perforation or abscess without bleeding: Secondary | ICD-10-CM

## 2011-03-29 MED ORDER — SODIUM CHLORIDE 0.9 % IV SOLN: 500.0000 mL | INTRAVENOUS | Status: DC

## 2011-03-29 NOTE — Patient Instructions (Signed)
Findings:  Barretts, Mild gastritis, Hiatal Hernia, Diverticulosis, Hemorrhoids  Recommendations:  High Fiber Diet, Metamucil or benefiber,  Office will call to make office appointment for 2 weeks.  Discharge instructions explained and given to pt and care partner.

## 2011-03-30 ENCOUNTER — Telehealth: Payer: Self-pay | Admitting: *Deleted

## 2011-03-30 DIAGNOSIS — K2970 Gastritis, unspecified, without bleeding: Secondary | ICD-10-CM

## 2011-03-30 DIAGNOSIS — K5732 Diverticulitis of large intestine without perforation or abscess without bleeding: Secondary | ICD-10-CM

## 2011-03-30 DIAGNOSIS — K2990 Gastroduodenitis, unspecified, without bleeding: Secondary | ICD-10-CM

## 2011-03-30 LAB — HELICOBACTER PYLORI SCREEN-BIOPSY: UREASE: NEGATIVE

## 2011-03-30 NOTE — Telephone Encounter (Signed)

## 2011-04-03 ENCOUNTER — Telehealth: Payer: Self-pay | Admitting: *Deleted

## 2011-04-03 ENCOUNTER — Encounter: Payer: Self-pay | Admitting: Gastroenterology

## 2011-04-03 NOTE — Telephone Encounter (Signed)
Scheduled pt for appt on 04/18/11 at 2:30pm.

## 2011-04-03 NOTE — Telephone Encounter (Signed)
LMOM for pt to call back. Per 03/29/11 ENDO, Dr Jarold Motto wanted to see pt in 2 weeks.

## 2011-04-08 LAB — HM COLONOSCOPY

## 2011-04-11 NOTE — Discharge Summary (Signed)
[name], [name]          ACCOUNT NO.:  [id number]   MEDICAL RECORD NO.:  [id number]          PATIENT TYPE:  INP   LOCATION:  1501                         FACILITY:  [facility]   PHYSICIAN:  Titus Dubin. Hopper, MD,FACP,FCCPDATE OF BIRTH:  1932/10/31   DATE OF ADMISSION:  01/22/2009  DATE OF DISCHARGE:  01/23/2009                               DISCHARGE SUMMARY   ADMITTING DIAGNOSES:  Headache and diplopia.   DISCHARGE DIAGNOSES:  Headache and diplopia.  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   BRIEF HISTORY:  This 77 year old female has had a left-sided headache  for approximately 3 weeks which has been severe and throbbing but  variable.  She has had associated photophobia and nausea.  She was seen  in the emergency room on January 18, 2009.  The initial ER diagnosis  was headache, for which pain medications were prescribed, specifically  Darvocet.  She was also given acetaminophen/hydrocodone.   She was admitted because of the persistence of the headache and new-  onset diplopia.   She was seen in consultation by Dr. Kelli Hope.  A CT scan of the  head on January 18, 2009 had been negative.   MRI of brain with and without contrast and MRA of the head without  contrast and MRA of the neck with and without contrast were all negative  for acute process.   C-reactive protein was 0.1.  RPR was nonreactive.  Clotting studies were  negative and sed rate was normal at 11.  Comprehensive metabolic profile  was totally negative.   Dr. Thad Ranger' opinion was that this represented a subacute left third  nerve palsy with most likely etiology being idiopathic.  There was no  evidence of any aneurysm or carcinomatosis, meningitis, or Tolosa-Hunt  syndrome.  Relationship to possible recent zoster infection was raised.   Antiviral medicines were to be continued.  Nonsteroidals as needed were  recommended, and a brief taper of steroids.   It was recommend that she wear a patch over the left eye to prevent  diplopia.  She was to contact Dr. Thad Ranger within 2 weeks for followup.   She does give a history of GI symptoms following a cortisone injection  of the left shoulder.  There is no history of a rash or fever with the  cortisone injection; she described it as profoundly painful.   She was discharged on her usual home meds of Nexium 20 mg daily, Centrum  Silver Allergy Relief over the counter as needed, Darvocet N-100, [name]  2.5/5 twice a day as needed, Synthroid 88 mcg daily.   She was given Gabapentin 100 mg every 8 hours as needed for headache.  Prednisone 5 mg dose pack was prescribed.   The morning of discharge she was continuing to complain of diplopia.  She was afebrile.  Pulse was 79 and regular, respiratory rate 20.  Blood  pressure was as low as 90/50.  The prior blood pressure was 127/71, O2  sats were 97% on room air.  CBG was 97.  She exhibited decreased  extraocular motion of the left eye, particularly superiorly  and  medially.  Ptosis on the left was noted.  She had a regular rhythm.  No  carotid bruits were noted.   Additional history reveals that she never started acyclovir which had  been prescribed.  She was to take 800 mg 5 times a day for a minimum of  7 days; she has a prescription for 10 days, which as stated, she has not  started.  She was to hold her lisinopril 10 mg at home if her blood  pressure is less than 100/60.   No change was made in her home diet.  She has had anorexia recently.  It  is anticipated the prednisone dose pack may improve this.   Her discharge status is improved.  Prognosis remains be determined and  will require neurologic followup over the next 3-6 months as per Dr.  Kelli Hope' note.      Titus Dubin. Alwyn Ren, MD,FACP,FCCP  Electronically Signed     WFH/MEDQ  D:  01/23/2009  T:  01/23/2009  Job:  [id number]   cc:   Casimiro Needle L. Thad Ranger, M.D.  Fax:  [phone]   Arta Silence, MD  Fax: [phone]

## 2011-04-11 NOTE — Op Note (Signed)
[name], [name]          ACCOUNT NO.:  [id number]   MEDICAL RECORD NO.:  [id number]          PATIENT TYPE:  AMB   LOCATION:  NESC                         FACILITY:  [facility]   PHYSICIAN:  Jamison Neighbor, M.D.  DATE OF BIRTH:  08-19-1930   DATE OF PROCEDURE:  06/11/2007  DATE OF DISCHARGE:                               OPERATIVE REPORT   PREOPERATIVE DIAGNOSIS:  Interstitial cystitis.   POSTOPERATIVE DIAGNOSIS:  Interstitial cystitis.   PROCEDURE:  Cystoscopy, urethral calibration, hydrodistention of the  bladder, Marcaine and Pyridium installation, Marcaine and Kenalog  injection, bladder biopsy with cauterization.   SURGEON:  Evans.   ANESTHESIA:  General.   COMPLICATIONS:  None.   DRAINS:  None.   BRIEF HISTORY:  This 77 year old female has had chronic pain within the  bladder with associated urinary urgency and frequency.  She was  evaluated by a urologist in [place] who did a biopsy in the office  and told that she might have IC but did not offer any therapy.  A  definitive diagnosis was never made.  The patient certainly seems to  have the signs and symptoms of IC and has requested formal diagnostic  testing.  She understands the risks and benefits of the procedure.  She  is aware of the fact that she may actually get some improvement in her  symptoms with the hydrodistention.  Full informed consent was obtained.   PROCEDURE:  After successful induction of general anesthesia, the  patient was placed in the dorsal position, prepped with Betadine and  draped in the usual sterile fashion.  Careful bimanual examination  showed no real cystocele, rectocele or enterocele.  There were no masses  on bimanual exam.  The urethra was palpably normal with no signs of  diverticulum.  The urethra was calibrated at 30-French with female  urethral sounds with no signs of stenosis or stricture.  The cystoscope  was inserted.  The bladder was carefully inspected.  No tumors  or stones  could be seen.  The ureteral orifices were normal in configuration and  location.  Hydrodistention of the bladder was performed.  The bladder  was distended at a pressure of 100 cm of water for 5 minutes.  When the  bladder was drained, glomerulations could be seen throughout the  bladder.  The bladder had a terminal blood tinge on the drain out cycle,  and the capacity was 600 mL which is diminished and consistent with  interstitial cystitis.  The patient had a biopsy.  The biopsy site was  then cauterized.  The bladder was drained.  A mixture of Marcaine and  Pyridium was left in the bladder.  Marcaine and Kenalog were injected  periurethrally as a postoperative block.  A B&O suppository was  inserted.  The patient received intraoperative Toradol and Zofran.  She  was taken to recovery in  good condition.  She will be sent home with Lorcet Plus, Pyridium Plus  and doxycycline and return to see me in follow-up in 3 weeks' time at  which point she will be started on Elmiron-based protocol and also be  offered instillation  therapy.      Jamison Neighbor, M.D.  Electronically Signed     RJE/MEDQ  D:  06/11/2007  T:  06/12/2007  Job:  [id number]   cc:   Jamison Neighbor, M.D.  Fax: [phone]

## 2011-04-11 NOTE — H&P (Signed)
NAMESURIYA, KOVARIK          ACCOUNT NO.:  [id number]   MEDICAL RECORD NO.:  [id number]          PATIENT TYPE:  INP   LOCATION:  1424                         FACILITY:  [facility]   PHYSICIAN:  Maryla Morrow, MD        DATE OF BIRTH:  05/16/1930   DATE OF ADMISSION:  01/31/2009  DATE OF DISCHARGE:                              HISTORY & PHYSICAL   PRIMARY CARE PHYSICIAN:  Dr. Arta Silence, MD.   CHIEF COMPLAINT:  Abdominal pain.   HISTORY OF PRESENT ILLNESS:  Ms. Brittingham is a 77 year old white female  with history of diverticulitis,hypertension, as well as chronic headache  and diplopia from the neurologic deficits was presenting to the ED today  with chief complaint of excruciating abdominal pain in the lower part  for the last 2 days.  The patient states that she was feeling sick to  the stomach where the pain was 10/10 in intensity and generalized in  character.  The patient had no relief with her home pain medication and  came to the ED for further evaluation.  Patient currently is also  complaining of  nausea on Dilaudid and Zofran daily with some relief.  She also complains to be dehydrated.   PAST MEDICAL HISTORY:  1. Hypertension.  2. 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  3. History of cancer of the thyroid status post thyroidectomy.  4. History of diverticulitis.  5. Hypothyroidism.  6. Shingles.   PAST SURGERIES:  1. Cholecystectomy.  2. Hysterectomy.  3. Thyroidectomy.   SOCIAL HISTORY:  Negative for tobacco, alcohol or drug abuse.  Patient  is single, lives alone.  Her sister-in-law helps her with her daily  needs.   ALLERGIES:  PENICILLIN, REGLAN and CORTISONE.   HOME MEDICATIONS:  1. Acyclovir currently at 800 mg 5 times daily for 10 days.  2. Nexium 20 mg daily.  3. Darvocet N-100 as needed.  4. [name] 2.5/5 mg p.r.n. twice daily.  5. Synthroid 88 mcg once daily.  6. Lisinopril 10 mg once daily.  7. Multivitamin once daily.   REVIEW OF  SYSTEMS:  All pertinent positives in HPI.   PHYSICAL EXAMINATION:  Patient cooperated well for her physical  examination.  Temperature was 98.3, pulse 111, respirations 18, blood pressure 132/64,  saturation 96% on room air.  HEENT:  Pupils are equal, round and reactive to light.  Extraocular  movements intact.  No JVD.  No lymphadenopathy.  Head is atraumatic and  normocephalic.  Oropharyngeal is clear.  Mucous membranes are dry.  CHEST:  Clear to auscultation bilaterally.  Equal expansion.  HEART:  Regular rate and rhythm.  S1, S2 normal.  ABDOMEN:  Tenderness to palpation on the lower part with guarding.  Bowel sounds are present.  EXTREMITIES:  No clubbing, cyanosis or edema.  Dorsalis pedis pulses are  2+ bilaterally.  SKIN:  No rash, ulceration.  Skin otherwise has low turgor.  PSYCHIATRIC:  Mood and affect normal.  CNS:  Examination reveals strength to be 5/5 in extremities and intact.  Speech is normal.  There is left eyelid ptosis.   PERTINENT LABS:  Potassium 2.4, creatinine 0.91.  AST 22, ALT 19.  White  count 14.6, hemoglobin 14.7, hematocrit 43, eosinophils 1%, neutrophils  82%.  Urinalysis revealed moderate bacteria with positive leukocyte  esterase and cloudy urine.   CT abdomen and pelvis is consistent with findings suggestive of  diverticulitis versus inflammatory changes around colonic mass.  There  is no free air.   ASSESSMENT AND PLAN:  1. Abdominal pain secondary to acute diverticulitis versus      questionable colonic mass which is very unlikely.  2. Urinary tract infection without sepsis.  3. Third nerve palsy with underlying deficit of diplopia and ptosis.  4. Headache.  5. Dehydration.  6. Hypokalemia.   RECOMMENDATIONS:  1. Intravenous antibiotics with Cipro, Flagyl.  P.r.n. medication.      Intravenous fluids and clear liquids.  We will replete potassium      chloride.  2. Restart home medications.  3. P.r.n. consult with GI in the a.m.       Maryla Morrow, MD  Electronically Signed     AP/MEDQ  D:  01/31/2009  T:  01/31/2009  Job:  [id number]   cc:   Arta Silence, MD  Fax: [phone]

## 2011-04-11 NOTE — Consult Note (Signed)
NAMEKELSEI, DEFINO          ACCOUNT NO.:  [id number]   MEDICAL RECORD NO.:  [id number]          PATIENT TYPE:  INP   LOCATION:  1501                         [facility]ACILITY:  [facility]   PHYSICIAN:  Casimiro Needle L. Reynolds, M.D.DATE OF BIRTH:  May 23, 1930   DATE OF CONSULTATION:  01/22/2009  DATE OF DISCHARGE:                                 CONSULTATION   REQUESTING PHYSICIAN:  Stacie Glaze, MD   REASON FOR EVALUATION:  Headache and diplopia.   HISTORY OF PRESENT ILLNESS:  This is the initial outpatient consultation  visit for this 77 year old woman referred for evaluation of the above  problem.  The patient reports that she has had headache on the left side  of head for about the last 3 weeks.  This has been a severe pain, which  is throbbing in character.  It is waxed and waned in severity over this  period of time.  It is located on the left side, and especially centered  in the left forehead and behind the eyes.  There has been some  associated photophobia and nausea when it has been severe.  She has seen  a primary doctor couple of times for this, and has been in and out the  ER for this.  She did not have any associated symptoms until yesterday,  when she noticed diplopia when she will look off to the right side.  She  noticed this looking out her window and also while watching TV.  Because  of this symptom, she came to the Emergency Department for another  evaluation, and is being admitted by her primary physician for  observation.  She had an MRI of the brain with and without contrast, as  well as MRA of the intracranial and extracranial circulation, which I  had the chance to review.  She denies any associated hearing changes,  slurred speech, or any focal numbness, tingling, or weakness of the  extremities.  She has a history of hypertension and hyperlipidemia, but  no previous history of diabetes.  Neurologic consultation is requested  regarding the above.   PAST MEDICAL  HISTORY:  She has history of hypertension and  hyperlipidemia as above, as well as hypothyroidism.  She has a very  remote history of thyroid cancer.  She was seen a couple of days ago in  her primary physician's office and was thought to possibly have herpes  zoster involving the left side of the head.   FAMILY/SOCIAL/REVIEW OF SYSTEMS:  Per admission H&P of January 22, 2009, by Dr. Lovell Sheehan which is reviewed.   MEDICATIONS:  Her medication list includes Synthroid, [name],  lisinopril, multivitamins, and Nexium.  She also recently started on  acyclovir.  She is taking Darvocet for pain, does not been all that  helpful.   PHYSICAL EXAMINATION:  VITAL SIGNS:  Temperature 98.3, blood pressure  135/65, pulse 84, and respirations 16.  GENERAL:  This is a healthy-appearing woman supine in hospital in no  distress.  HEAD:  Cranium is normocephalic and atraumatic.  Oropharynx benign.  NECK:  Supple without carotid or supraclavicular bruits.  HEART:  Regular rate and  rhythm without murmurs.  NEUROLOGIC:  Mental status:  She is awake and alert.  She is oriented to  time, place, and person.  Recent and remote memory are intact.  Attention span, concentration, and fund of knowledge are all  appropriate.  Speech is fluent and not dysarthric.  Mood is euthymic.  Affect is a little anxious.  Cranial nerves:  Left pupil is slightly  larger than the right, but both are briskly reactive.  Examination of  the extraocular movements reveals full movements on the right, with  severe restriction of adduction and limitation of upward and downward  movement on the left.  She also has a mild left ptosis.  She has full  abduction of the left eye.  Visual fields are full to confrontation.  Hearing is intact to conversational speech and to finger rub.  Facial  sensation is intact to pinprick.  Face, tongue, and palate move normally  and symmetrically.  Motor:  Normal bulk and tone.  Normal strength in  all  tested extremity muscles.  Sensation intact to pinprick and light  touch in all extremities.  Coordination:  Finger-to-nose and rapid  movements performed accurately.  Gait is deferred.  Reflexes 2+ and  symmetric.  Toes are downgoing bilaterally.   LABORATORY REVIEW:  Sedimentation rate is 15 which is normal.  CMET  today is normal.  MRI of the brain with MRA of the intracranial  circulation is personally reviewed.  The MRI demonstrates no meaningful  abnormalities.  The MRA of the head demonstrates no significant  abnormalities specifically no aneurysm in the vicinity of the left  posterior communicating artery, which is an area that is well seen.  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   IMPRESSION:  Subacute left third nerve palsy.  This most likely is  idiopathic without evidence of aneurysm by MRA or more concerned  etiology such as carcinomatous meningitis or Tolosa-Hunt syndrome by  MRI.  This may in fact be related to her recent zoster diagnosis.   RECOMMENDATIONS:  I would agree with continuing antiviral medications.  She will benefit from symptomatic treatment with nonsteroidals, possibly  brief taper of steroids.  It is possible her now incomplete third nerve  palsy will become more severe and eventually complete, but in a given  time, about 3-6 months, she should heal up.  In the meantime, she would  be best served by wearing a patch in the left eye to avoid diplopia.  I  would be happy to see her in followup in the office in the next couple  of weeks to know how she is doing.   Thanks for the consultation.       Michael L. Thad Ranger, M.D.  Electronically Signed     MLR/MEDQ  D:  01/22/2009  T:  01/23/2009  Job:  [id number]

## 2011-04-11 NOTE — Discharge Summary (Signed)
[name], [name]          ACCOUNT NO.:  [id number]   MEDICAL RECORD NO.:  [id number]          PATIENT TYPE:  INP   LOCATION:  1424                         FACILITY:  [facility]   PHYSICIAN:  Rosalyn Gess. Norins, MD  DATE OF BIRTH:  02/29/1930   DATE OF ADMISSION:  01/31/2009  DATE OF DISCHARGE:  02/10/2009                               DISCHARGE SUMMARY   ADMITTING DIAGNOSES:  1. Abdominal pain secondary to acute diverticulitis versus colonic      mass.  2. Urinary tract infection without sepsis.  3. Cranial nerve III palsy I think in the left eye.  4. Headache.  5. Dehydration.  6. Hypokalemia.   DISCHARGE DIAGNOSES:  1. Diverticulitis with early abscess.  2. Cranial nerve III palsy, stable.  3. Urinary tract infection.  4. Headache possibly secondary to zoster.  5. Dehydration, resolved.  6. Hypokalemia, resolved.   CONSULTANTS:  Dr. Manus Rudd for general surgery.   PROCEDURES:  1. Acute abdominal film March 7, day of admission, which showed      nonobstructive bowel gas pattern, no free air.  2. CT scan of the abdomen March 7 which showed lumbar spondylosis and      degenerative disk disease, mild biliary dilatation, mild      interstitial prominence at the lung bases.  3. A CT of the pelvis which showed abnormal appearance of the colon      with localized wall thickening in the mid portion of the transverse      colon.  Sigmoid diverticulosis was noted.  Diverticulitis was      entertained as a possible diagnosis in this patient.  4. CT scan of the abdomen March 9 which showed no acute findings.  5. A CT scan of the pelvis March 9 which showed mild distal sigmoid      diverticulitis increased since prior study.  A small amount of free      fluid versus early abscess in the right pelvic cul-de-sac.   HISTORY OF PRESENT ILLNESS:  The patient is a 76 year old woman recently  hospitalized with the onset of a third nerve palsy leading to diplopia,  and she also has  severe headache.  She was seen in consultation by Dr.  Thad Ranger for that problem, and it was thought to be idiopathic in  origin.  The patient presented to the emergency department complaining  of excruciating abdominal pain in the lower abdomen for the 2 days prior  to admission.  She reports that she had been feeling sick to the stomach  rating the pain as a 10/10.  She had no relief with home medications,  and therefore came to the hospital for evaluation.  She subsequently was  found to have strong evidence of diverticulitis, and was subsequently  admitted.  Please see both recent hospital records as well as the admission note  for past medical history, family history, social history, examination.   HOSPITAL COURSE:  1. Diverticulitis.  The patient had been started on IV Cipro and      Flagyl.  She does have a PENICILLIN allergy.  She continued to have  significant and progressive pain despite antibiotic therapy.      Follow-up CT scan was obtained which raised the issue of a possible      early abscess.  Dr. Corliss Skains saw the patient in consultation who felt      that she did not require surgical intervention, but that medical      therapy would be continued.  She was continued on IV Cipro and      Flagyl, and continued to improve on this regimen.  She was slowly      able to have her diet advanced.  Was able to  transition her to      oral Cipro and Flagyl on the 16th.  She continued to do well; at      this point would be stable with no fever, decreased pain, and      discomfort and able to complete an oral course of antibiotics at      home.  2. Third nerve palsy on the left.  This remains a problem for the      patient.  Etiology remains unclear.  There is a question whether      she had zoster and because she had recently been treated with      acyclovir, but never developed a rash.  For her pain she was      started on Neurontin.  It was titrated up to 200 mg t.i.d., and at       time of discharge will be increased to 300 mg t.i.d.  This does      give her some pain relief.  With the patient being stable, with her      being afebrile for greater than 48 hours, with her abdominal pain      and discomfort markedly improved, with her being able to take a      diet, at this point she is felt to be stable and ready for      discharge home with appropriate follow-up with her consultants.   DISCHARGE EXAMINATION:  Temperature was 98.1, blood pressure 108/62,  heart rate 79, respirations 20, O2 sats 96% on room air.  HEENT:  The patient has mild temporal wasting.  Her left lid remains  down, and she is unable to open her eyes spontaneously.  With her lid  being held open, she is unable to abduct her left eye, but can adduct  it.  Pupils were equal and reactive.  No oral lesions were noted.  NECK:  Supple.  CHEST:  Patient is moving air well with no rales, wheezes or rhonchi.  CARDIOVASCULAR:  2+ radial pulse.  She had a quiet precordium with  regular rate and rhythm without murmurs.  ABDOMEN:  The patient has  bowel sounds in all 4 quadrant.  She has no guarding or rebound.  She  has no significant tenderness to palpation of the abdomen including the  left lower quadrant.  EXTREMITIES:  Without clubbing, cyanosis, edema, no deformities are  noted.   FINAL LABORATORY:  The patient has had no laboratory since March 10 at  which time her CBC revealed a white count of 8900, hemoglobin 11 g,  hematocrit was 31.5%, platelet count 175,000.  She had a normal  differential.  Final chemistries from February 03, 2009 with sodium 138,  potassium 3.9, chloride 105, CO2 29, BUN 2, creatinine 0.8, glucose was  107.   DISCHARGE MEDICATIONS:  The patient will continue on:   1. Lisinopril  10 mg daily.  2. Synthroid 88 mcg daily.  3. [name] 2.5/5 b.i.d. p.r.n. abdominal pain and spasm.  4. Gabapentin which will be increased to 300 mg t.i.d. on a scheduled      basis.  5. Nexium 20  mg p.o. daily.  6. Multivitamin daily.  7. Additional prescriptions will be written for Cipro 500 mg b.i.d.,      Flagyl 500 mg 4 times a day.   The patient will be seen in follow-up by her primary care physician, Dr.  Hetty Ely, in 7-14 days.  She will be scheduled to see Dr. Thad Ranger at  the first available appointment.  She will be scheduled to see Dr.  Manus Rudd for follow-up in 3-4 weeks to discuss whether she needs an  elective colectomy.   PATIENT'S CONDITION AT TIME OF DISCHARGE DICTATION:  Stable and  improved.      Rosalyn Gess Norins, MD  Electronically Signed     MEN/MEDQ  D:  02/10/2009  T:  02/10/2009  Job:  [id number]   cc:   Wilmon Arms. Corliss Skains, M.D.  [address] [place] [address] [place] [address]  Denver L. Thad Ranger, M.D.  Fax: [phone]   Arta Silence, MD  Fax: [phone]

## 2011-04-11 NOTE — Consult Note (Signed)
[name], [name]          ACCOUNT NO.:  [id number]   MEDICAL RECORD NO.:  [id number]          PATIENT TYPE:  INP   LOCATION:  1424                         FACILITY:  [facility]   PHYSICIAN:  Wilmon Arms. Corliss Skains, M.D. DATE OF BIRTH:  05-04-1930   DATE OF CONSULTATION:  02/03/2009  DATE OF DISCHARGE:                                 CONSULTATION   REASON FOR CONSULTATION:  Sigmoid diverticulitis.   HISTORY OF PRESENT ILLNESS:  This is a 77 year old female with a long  history of several episodes of diverticulitis who is followed from a GI  standpoint by Dr. Marina Goodell.  She also history of hypertension.  She  presents with a 2-week history of some vague lower abdominal pain.  Over  the last 5 days she reports increasing abdominal pain to the point where  she came to the emergency department.  She has also developed a lot of  nausea.  She has not had a bowel movement in 5 days.  She was admitted  to the hospital with a presumptive diagnosis of sigmoid diverticulitis.  A CT scan on January 31, 2009 showed some thickening of the mid transverse  colon and some free fluid in the left lower quadrant.  A repeat CT scan  on March 9 showed some distal sigmoid diverticulitis with some stranding  and pericolic fluid.  In the right cul-de-sac, there is a 3.3 x 3.6 cm  pelvic fluid collection, but no rim enhancement.  Her pain has improved  somewhat but she is receiving regularly administered IV pain medication  as well as nausea medication.  Her main complaint is the persistent  nausea.  She does not feel distended.  She has been afebrile throughout  her hospitalization.   PAST MEDICAL HISTORY:  1. Hypertension.  2. History of thyroid cancer.  3. History of sigmoid diverticulitis.  4. History is of colon polyps status post polypectomy.  5. Hypothyroidism.  6. Shingles resulting in third nerve palsy recently.  7. Interstitial cystitis.   PAST SURGICAL HISTORY:  Thyroidectomy, cystoscopy,  hysterectomy, and an  open cholecystectomy.   ALLERGIES:  PENICILLIN, REGLAN, CORTISONE.   HOME MEDICATIONS:  Acyclovir, Nexium, Darvocet, [name], Synthroid,  lisinopril, Neurontin.   SOCIAL HISTORY:  Nonsmoker, nondrinker.   PHYSICAL EXAMINATION:  VITAL SIGNS:  Current temperature is 99.1, pulse  79, blood pressure 111/46, sats 95% on room air.  CONSTITUTIONAL:  This is a well-developed, well-nourished female in no  apparent distress.  HEENT:  She has a left eye and facial droop.  EOMI.  Sclerae anicteric.  NECK:  Well-healed transverse incision.  No palpable masses.  LUNGS:  Clear to auscultation bilaterally.  Normal respiratory effort.  HEART:  Regular rate and rhythm.  No murmur.  ABDOMEN:  A well-healed right subcostal and lower midline incision.  Minimally distended.  Tender in the suprapubic and left lower quadrant.  No palpable masses.  No skin changes.  Positive bowel sounds.  EXTREMITIES:  No edema.  SKIN:  Warm, dry with no sign of jaundice.   LABS:  White count 8.9, hemoglobin 11.0.  Electrolytes within normal  limits.   IMPRESSION:  Sigmoid diverticulitis with no obvious indication for  urgent colectomy.   RECOMMENDATIONS:  Would keep the patient n.p.o. except for ice chips  until the abdominal pain is resolved.  Continue IV antibiotics for now.  If her clinical condition worsens, she may need emergent sigmoid  colectomy.  I explained to her that this would entail including a  possible need for temporary colostomy and Hartmann's pouch.  Hopefully,  we can get her clinical status improved with the IV antibiotics and the  patient will definitely need elective colectomy at some point.      Wilmon Arms. Tsuei, M.D.  Electronically Signed     MKT/MEDQ  D:  02/03/2009  T:  02/03/2009  Job:  [id number]   cc:   Rosalyn Gess. Norins, MD  [address]  [place]  [place] [address]

## 2011-04-14 ENCOUNTER — Encounter: Payer: Self-pay | Admitting: *Deleted

## 2011-04-18 ENCOUNTER — Encounter: Payer: Self-pay | Admitting: Gastroenterology

## 2011-04-18 ENCOUNTER — Ambulatory Visit (INDEPENDENT_AMBULATORY_CARE_PROVIDER_SITE_OTHER): Payer: Medicare Other | Admitting: Gastroenterology

## 2011-04-18 ENCOUNTER — Other Ambulatory Visit (INDEPENDENT_AMBULATORY_CARE_PROVIDER_SITE_OTHER): Payer: Medicare Other | Admitting: Gastroenterology

## 2011-04-18 ENCOUNTER — Other Ambulatory Visit: Payer: Medicare Other

## 2011-04-18 VITALS — BP 148/84 | HR 68 | Ht 63.0 in | Wt 130.8 lb

## 2011-04-18 DIAGNOSIS — K2270 Barrett's esophagus without dysplasia: Secondary | ICD-10-CM

## 2011-04-18 DIAGNOSIS — K219 Gastro-esophageal reflux disease without esophagitis: Secondary | ICD-10-CM

## 2011-04-18 DIAGNOSIS — Z87440 Personal history of urinary (tract) infections: Secondary | ICD-10-CM

## 2011-04-18 DIAGNOSIS — K589 Irritable bowel syndrome without diarrhea: Secondary | ICD-10-CM

## 2011-04-18 LAB — URINALYSIS, ROUTINE W REFLEX MICROSCOPIC
Bilirubin Urine: NEGATIVE
Ketones, ur: NEGATIVE
Nitrite: NEGATIVE
Specific Gravity, Urine: 1.015 (ref 1.000–1.030)
Total Protein, Urine: NEGATIVE
Urine Glucose: NEGATIVE
Urobilinogen, UA: 0.2 (ref 0.0–1.0)
pH: 6.5 (ref 5.0–8.0)

## 2011-04-18 MED ORDER — SULFAMETHOXAZOLE-TRIMETHOPRIM 800-160 MG PO TABS: 1.0000 | ORAL_TABLET | Freq: Two times a day (BID) | ORAL | Status: AC

## 2011-04-18 NOTE — Patient Instructions (Signed)
Please go to the basement today for your labs.  Your prescription(s) have been sent to you pharmacy.   

## 2011-04-18 NOTE — Progress Notes (Signed)
This is a 77 year old Caucasian female who recently underwent endoscopy and was found to have Barrett's mucosa confirmed by biopsy. SHE IS asymptomatic all Prilosec 40 mg a day and she also has chronic gas and bloating that seems to be improved on Align probiotic therapy. She has some mental status problems, and is accompanied by her sister-in-law today. She takes when necessary MiraLax for constipation. Today she complains of urinary frequency and burning urination, and was treated for UTI in November by Dr. Para March. Colonoscopy was also recently completed.  Current Medications, Allergies, Past Medical History, Past Surgical History, Family History and Social History were reviewed in [employer] record.  Pertinent Review of Systems Negative,, positive GU symptoms but no cardiovascular or pulmonary complaints or systemic complain    Assessment and Plan: Chronic GERD improved on daily PPI therapy. She has Barrett's mucosa but no dysplasia, and I would not recommend serial endoscopies at her age. We have ordered urine culture and urinalysis and have ordered Septra DS twice a day for a probable UTI. She is to continue her other GI medications that were listed and reviewed with her today. She will followup with [name] as needed. She also has chronic IBS, constipation predominant with recent colonoscopy showing marked diverticulosis and large external hemorrhoids which are not bothering her at this time. She will not need followup colonoscopy at her age Encounter Diagnosis  Name Primary?  . History of recurrent UTIs Yes

## 2011-04-20 LAB — URINE CULTURE: Colony Count: 70000

## 2011-04-21 ENCOUNTER — Telehealth: Payer: Self-pay | Admitting: Family Medicine

## 2011-04-21 DIAGNOSIS — Z87440 Personal history of urinary (tract) infections: Secondary | ICD-10-CM

## 2011-04-21 NOTE — Telephone Encounter (Signed)
Pt with h/o mult UTIs.  Recently with Ucx pos kleb, sens to septra.  Per GI note, rec uro eval.    Please call pt.  With h/o mult UTIs, please refer to urology for eval.  Thanks.

## 2011-04-21 NOTE — Progress Notes (Signed)
Sent to primary care md

## 2011-04-21 NOTE — Telephone Encounter (Signed)
Appt made with Dr Aldean Ast patient notified. MK

## 2011-05-01 ENCOUNTER — Other Ambulatory Visit: Payer: Self-pay | Admitting: *Deleted

## 2011-05-01 MED ORDER — HYDROCODONE-ACETAMINOPHEN 2.5-500 MG PO TABS: ORAL_TABLET | ORAL | Status: DC

## 2011-05-01 NOTE — Telephone Encounter (Signed)
She needs OV.  Please schedule to discuss back pain.  Please call in rx in meantime.  Thanks.

## 2011-05-02 NOTE — Telephone Encounter (Signed)
Patient notified as instructed by telephone. Patient declined to schedule an appointment at this time stating that she will have to call back and schedule the appointment. Patient stated that she has several doctor's appointment already scheduled and will have to see how those go this week before scheduling an appointment with Dr. Para March. Rx called to pharmacy.

## 2011-05-13 ENCOUNTER — Encounter: Payer: Self-pay | Admitting: Family Medicine

## 2011-05-16 ENCOUNTER — Encounter: Payer: Self-pay | Admitting: Family Medicine

## 2011-05-16 ENCOUNTER — Ambulatory Visit (INDEPENDENT_AMBULATORY_CARE_PROVIDER_SITE_OTHER): Payer: Medicare Other | Admitting: Family Medicine

## 2011-05-16 DIAGNOSIS — K2270 Barrett's esophagus without dysplasia: Secondary | ICD-10-CM

## 2011-05-16 DIAGNOSIS — F3289 Other specified depressive episodes: Secondary | ICD-10-CM

## 2011-05-16 DIAGNOSIS — Z85850 Personal history of malignant neoplasm of thyroid: Secondary | ICD-10-CM

## 2011-05-16 DIAGNOSIS — M549 Dorsalgia, unspecified: Secondary | ICD-10-CM

## 2011-05-16 DIAGNOSIS — F329 Major depressive disorder, single episode, unspecified: Secondary | ICD-10-CM

## 2011-05-16 MED ORDER — ALPRAZOLAM 0.25 MG PO TABS: 0.2500 mg | ORAL_TABLET | Freq: Three times a day (TID) | ORAL | Status: DC | PRN

## 2011-05-16 NOTE — Progress Notes (Signed)
H/o Barrett's on EGD.  On PPI but had some abd pain when she added the align.  She's taking these together.  She'll split these up and if the pain/diarrhea continues, stop the align.  She is doing well on the omeprazole o/w.  H/o thyroid cancer  ~15 years ago and TSH recently wnl.  Swallowing is at baseline, no new mass.  No voice change.    She's asking about xanax.  She's looking after her sister in [place], "I do the best I can" running two households.  Pt lives alone.  All this has been stressful for her.  She may get 5-6 calls a day from her sister.  No SI/HI.   She had f/u with urology- saw Dr. Aldean Ast.  She was doing well by the time she got there.  No intervention needed.    H/o chronic back pain, L sided with radicular sx, on medical mgmt.  Using vicodin, but the 2.5mg  won't be available per report.  Asking about options.  No weakness in legs.  H/o barrett's, so needs to avoid NSAIDs.  I called over to [facility] pharmacy about this.   PMH and SH reviewed  ROS: See HPI, otherwise noncontributory.  Meds, vitals, and allergies reviewed.   GEN: nad, alert and oriented, affect wnl HEENT: mucous membranes moist NECK: supple w/o LA, no mass CV: rrr PULM: ctab, no inc wob ABD: soft, +bs EXT: no edema SKIN: no acute rash

## 2011-05-16 NOTE — Patient Instructions (Addendum)
I would use the xanax as needed.  It can make your drowsy.   Let me now if the xanax doesn't help or if you need if frequently.   I called the pharmacy about your pain meds.  We should be able to get it for another year.  We can address any needed changes at that point.    If the align continues to bother your stomach, then stop it and let the GI clinic know.  Thanks.  Take care.  Glad to see you.    I'd like to see you back for a visit in the fall.

## 2011-05-17 ENCOUNTER — Encounter: Payer: Self-pay | Admitting: Family Medicine

## 2011-05-17 NOTE — Assessment & Plan Note (Signed)
No mass, no alarming sx, and TSH wnl.  No change in meds.

## 2011-05-17 NOTE — Assessment & Plan Note (Addendum)
With likely stress related anxiety.  D/w pt about BZD.  Okay for episodic use, if needed frequently we'll need to consider other meds.  Sedation caution given.

## 2011-05-17 NOTE — Assessment & Plan Note (Signed)
Cont ppi. If abd pain continues with the change in the align, then stop the align and notify GI.  She agrees.

## 2011-05-17 NOTE — Assessment & Plan Note (Signed)
I called to [facility] and the issue was the eventual discontinuation of tylenol containing meds, >500mg  per tab.  We'll address this when needed. Cont current meds for now.  She agrees.

## 2011-06-02 ENCOUNTER — Other Ambulatory Visit: Payer: Self-pay | Admitting: *Deleted

## 2011-06-02 MED ORDER — LISINOPRIL 10 MG PO TABS: 10.0000 mg | ORAL_TABLET | Freq: Every evening | ORAL | Status: DC

## 2011-06-02 NOTE — Telephone Encounter (Signed)
rx sent to pharmacy by e-script  

## 2011-06-26 ENCOUNTER — Other Ambulatory Visit: Payer: Self-pay | Admitting: *Deleted

## 2011-06-26 MED ORDER — HYDROCODONE-ACETAMINOPHEN 2.5-500 MG PO TABS: ORAL_TABLET | ORAL | Status: DC

## 2011-06-26 MED ORDER — OMEPRAZOLE 40 MG PO CPDR: 40.0000 mg | DELAYED_RELEASE_CAPSULE | Freq: Every day | ORAL | Status: DC

## 2011-06-26 MED ORDER — LEVOTHYROXINE SODIUM 88 MCG PO TABS: 88.0000 ug | ORAL_TABLET | Freq: Every day | ORAL | Status: DC

## 2011-06-26 NOTE — Telephone Encounter (Signed)
Please call in

## 2011-06-26 NOTE — Telephone Encounter (Signed)
Phoned request from pt.

## 2011-06-27 NOTE — Telephone Encounter (Signed)
rx called into pharmacy

## 2011-07-26 ENCOUNTER — Ambulatory Visit (INDEPENDENT_AMBULATORY_CARE_PROVIDER_SITE_OTHER): Payer: Medicare Other | Admitting: Family Medicine

## 2011-07-26 ENCOUNTER — Encounter: Payer: Self-pay | Admitting: Family Medicine

## 2011-07-26 VITALS — BP 130/84 | HR 84 | Temp 98.2°F | Wt 133.2 lb

## 2011-07-26 DIAGNOSIS — R300 Dysuria: Secondary | ICD-10-CM

## 2011-07-26 DIAGNOSIS — Z87440 Personal history of urinary (tract) infections: Secondary | ICD-10-CM

## 2011-07-26 LAB — POCT URINALYSIS DIPSTICK
Bilirubin, UA: NEGATIVE
Glucose, UA: NEGATIVE
Ketones, UA: NEGATIVE
Nitrite, UA: NEGATIVE
Spec Grav, UA: 1.015
Urobilinogen, UA: 0.2
pH, UA: 5

## 2011-07-26 MED ORDER — NITROFURANTOIN MONOHYD MACRO 100 MG PO CAPS: 100.0000 mg | ORAL_CAPSULE | Freq: Two times a day (BID) | ORAL | Status: AC

## 2011-07-26 NOTE — Assessment & Plan Note (Signed)
Looks to be another acute infection. U/A pos, micro with 8-10 WBCs, 0-1 RBCs, rare Epis. Push fluids routinely. Start Macrobid for one weel. (was last on Septra 11/11) Call if sxs don't resolve.  Will send urine for culture.

## 2011-07-26 NOTE — Progress Notes (Signed)
  Subjective:    Patient ID: [name], female    DOB: Apr 04, 1930, 77 y.o.   MRN: [id number]  HPI Former pt of mine who now sees Dr Para March who is here for acute appt for poss UTI. She had seen Urology for sxs previously which cleared by the time she had gotten there. Today she has pressure with difficulty going, had "up and down" all night in bed and now has some burning. She has not had fever but had chills last evening.  She has taken no different meds but has been using topical cream to avoid burning from local affect of urine on the mucous membranes.  She otherwise is doing ok.   Review of SystemsNoncontributory except as above.       Objective:   Physical Exam  Constitutional: She appears well-developed and well-nourished. No distress.  HENT:  Head: Normocephalic and atraumatic.  Right Ear: External ear normal.  Left Ear: External ear normal.  Nose: Nose normal.  Mouth/Throat: Oropharynx is clear and moist. No oropharyngeal exudate.  Eyes: Conjunctivae and EOM are normal. Pupils are equal, round, and reactive to light.  Neck: Normal range of motion. Neck supple. No thyromegaly present.  Cardiovascular: Normal rate, regular rhythm and normal heart sounds.   Pulmonary/Chest: Effort normal and breath sounds normal. She has no wheezes. She has no rales.  Abdominal: Soft. Bowel sounds are normal. She exhibits no distension. There is tenderness (suprapubically).  Musculoskeletal: Normal range of motion.       No CVAT.  Lymphadenopathy:    She has no cervical adenopathy.  Skin: She is not diaphoretic.          Assessment & Plan:

## 2011-07-28 LAB — URINE CULTURE
Colony Count: NO GROWTH
Organism ID, Bacteria: NO GROWTH

## 2011-08-10 ENCOUNTER — Ambulatory Visit (INDEPENDENT_AMBULATORY_CARE_PROVIDER_SITE_OTHER): Payer: Medicare Other | Admitting: Family Medicine

## 2011-08-10 ENCOUNTER — Encounter: Payer: Self-pay | Admitting: Family Medicine

## 2011-08-10 ENCOUNTER — Telehealth: Payer: Self-pay | Admitting: *Deleted

## 2011-08-10 VITALS — BP 120/74 | HR 80 | Temp 98.4°F | Wt 133.0 lb

## 2011-08-10 DIAGNOSIS — Z87440 Personal history of urinary (tract) infections: Secondary | ICD-10-CM

## 2011-08-10 DIAGNOSIS — R300 Dysuria: Secondary | ICD-10-CM

## 2011-08-10 LAB — POCT URINALYSIS DIPSTICK
Bilirubin, UA: NEGATIVE
Glucose, UA: NEGATIVE
Ketones, UA: NEGATIVE
Nitrite, UA: NEGATIVE
Spec Grav, UA: 1.02
Urobilinogen, UA: 0.2
pH, UA: 6

## 2011-08-10 MED ORDER — PHENAZOPYRIDINE HCL 100 MG PO TABS: 100.0000 mg | ORAL_TABLET | Freq: Three times a day (TID) | ORAL | Status: AC | PRN

## 2011-08-10 MED ORDER — CIPROFLOXACIN HCL 500 MG PO TABS: 500.0000 mg | ORAL_TABLET | Freq: Two times a day (BID) | ORAL | Status: AC

## 2011-08-10 NOTE — Progress Notes (Signed)
  Subjective:    Patient ID: [name], female    DOB: November 19, 1930, 77 y.o.   MRN: [id number]  HPI Pt here again after being seen three weeks ago and c/o sxs c/w UTI but culture negative. She was treated with Macrobid for one week for presumed urethritis with improvement of her sxs but is now back due to request for more antibiotics because the urgency, burning and frequency have returned. Yesterday "it was bad." Pressure of needing to go. Then she goes and sits and barely goes. Then she goes back to bed and has to go again. Last night was an especially bad night. This is the same thing she had last time and those sxs got better for a while after taking the week of Macrobid. The problems are worse at night. She tends to stay up late at night and tries to drink cranberry juice to help with poss infection.    Review of SystemsNoncontributory except as above.      Objective:   Physical Exam  Constitutional: She appears well-developed and well-nourished. No distress.  HENT:  Head: Normocephalic and atraumatic.  Right Ear: External ear normal.  Left Ear: External ear normal.  Nose: Nose normal.  Mouth/Throat: Oropharynx is clear and moist. No oropharyngeal exudate.  Eyes: Conjunctivae and EOM are normal. Pupils are equal, round, and reactive to light.  Neck: Normal range of motion. Neck supple. No thyromegaly present.  Cardiovascular: Normal rate, regular rhythm and normal heart sounds.   Pulmonary/Chest: Effort normal and breath sounds normal. She has no wheezes. She has no rales.  Abdominal:       Mild suprapubic tenderness to palpation.  Genitourinary:       No CVAT.  Lymphadenopathy:    She has no cervical adenopathy.  Skin: She is not diaphoretic.          Assessment & Plan:

## 2011-08-10 NOTE — Telephone Encounter (Signed)
Also want to discuss the situation as she did not have a UTI according to the culture.

## 2011-08-10 NOTE — Assessment & Plan Note (Signed)
Micro signif for TNTC WBCs, Rare RBCs Rare Epis. Will treat with Cipro and Pyridium. If returns, will consider trial of Flomax to help emptying.

## 2011-08-10 NOTE — Telephone Encounter (Signed)
Pt was seen on 8/29 for a UTI.  She finished her macrobid and got better but now the same symptoms have come back again- pressure, frequency, burning.  She is asking if she can have more antibitic.  [facility]ses [facility].

## 2011-08-10 NOTE — Telephone Encounter (Signed)
Patient notified as instructed by telephone. Appointment scheduled for today at 3:00 PM.

## 2011-08-10 NOTE — Patient Instructions (Signed)
Take Cipro twice a day  For ten days. Take Pyridium three times a day for three days and then as needed for burning. If not emptying well by Sat a week, call for appt to be seen again the next Wed or Thu.

## 2011-08-10 NOTE — Telephone Encounter (Signed)
Please have pt come in again. Need another urine sample

## 2011-08-10 NOTE — Progress Notes (Signed)
Addended by: Sydell Axon C on: 08/10/2011 03:40 PM   Modules accepted: Orders

## 2011-08-13 LAB — URINE CULTURE: Colony Count: >=100000

## 2011-08-28 ENCOUNTER — Other Ambulatory Visit: Payer: Self-pay | Admitting: *Deleted

## 2011-08-28 MED ORDER — LISINOPRIL 10 MG PO TABS: 10.0000 mg | ORAL_TABLET | Freq: Every evening | ORAL | Status: DC

## 2011-08-28 MED ORDER — MIRTAZAPINE 15 MG PO TABS: 15.0000 mg | ORAL_TABLET | Freq: Every day | ORAL | Status: DC

## 2011-08-28 NOTE — Telephone Encounter (Signed)
Sent!

## 2011-08-28 NOTE — Telephone Encounter (Signed)
Received refill request from pharmacy. Is it okay to refill medication? 

## 2011-09-11 LAB — POCT I-STAT 4, (NA,K, GLUC, HGB,HCT)
Glucose, Bld: 92
HCT: 36
Hemoglobin: 12.2
Operator id: 268271
Potassium: 3.9
Sodium: 141

## 2011-10-02 ENCOUNTER — Ambulatory Visit (INDEPENDENT_AMBULATORY_CARE_PROVIDER_SITE_OTHER): Payer: Medicare Other | Admitting: Family Medicine

## 2011-10-02 ENCOUNTER — Encounter: Payer: Self-pay | Admitting: Family Medicine

## 2011-10-02 VITALS — BP 136/76 | HR 60 | Temp 97.7°F | Wt 134.0 lb

## 2011-10-02 DIAGNOSIS — R300 Dysuria: Secondary | ICD-10-CM

## 2011-10-02 DIAGNOSIS — Z87440 Personal history of urinary (tract) infections: Secondary | ICD-10-CM

## 2011-10-02 LAB — POCT URINALYSIS DIPSTICK
Bilirubin, UA: NEGATIVE
Glucose, UA: NEGATIVE
Ketones, UA: NEGATIVE
Nitrite, UA: POSITIVE
Spec Grav, UA: <=1.005
Urobilinogen, UA: 0.2
pH, UA: 6

## 2011-10-02 MED ORDER — NITROFURANTOIN MONOHYD MACRO 100 MG PO CAPS: 100.0000 mg | ORAL_CAPSULE | Freq: Two times a day (BID) | ORAL | Status: AC

## 2011-10-02 NOTE — Assessment & Plan Note (Signed)
With acute sx, check ucx, start macrobid and refer back to uro.  She agrees.  No sign of pyelo.

## 2011-10-02 NOTE — Progress Notes (Signed)
Dysuria: yes, burning, pressure duration of symptoms:Since Thursday. abdominal pain:no fevers:no back pain:no other concerns: minimal help with pyridium H/o frequent UTIs.    H/o eval by Dr. Clayburn Pert with uro prev.    Meds, vitals, and allergies reviewed.   ROS: See HPI.  Otherwise negative.    GEN: nad, alert and oriented HEENT: mucous membranes moist NECK: supple CV: rrr.  PULM: ctab, no inc wob ABD: soft, +bs, suprapubic area tender EXT: no edema SKIN: no acute rash BACK: no CVA pain

## 2011-10-02 NOTE — Patient Instructions (Addendum)
See Shirlee Limerick about your referral before your leave today. Drink plenty of water and start the antibiotics today.  We'll contact you with your lab report.  Take care.

## 2011-10-04 LAB — URINE CULTURE: Colony Count: 25000

## 2011-10-18 ENCOUNTER — Telehealth: Payer: Self-pay | Admitting: Family Medicine

## 2011-10-18 ENCOUNTER — Other Ambulatory Visit: Payer: Self-pay | Admitting: Family Medicine

## 2011-10-18 DIAGNOSIS — N390 Urinary tract infection, site not specified: Secondary | ICD-10-CM

## 2011-10-18 DIAGNOSIS — Z87440 Personal history of urinary (tract) infections: Secondary | ICD-10-CM

## 2011-10-18 MED ORDER — SULFAMETHOXAZOLE-TRIMETHOPRIM 800-160 MG PO TABS: 1.0000 | ORAL_TABLET | Freq: Two times a day (BID) | ORAL | Status: DC

## 2011-10-18 NOTE — Telephone Encounter (Signed)
Patient advised.  Medication phoned to pharmacy.  Urine sample sent for culture.

## 2011-10-18 NOTE — Telephone Encounter (Signed)
Need urine for culture before starting Abs. Will use Bactrim DS as guess for trmt. URINE MUST BE BROUGHT IN BEFORE SHE STARTS THE ANTIBIOTIC. Please send urine for culture.

## 2011-10-18 NOTE — Telephone Encounter (Signed)
Patient saw doctor 2 weeks ago and was on medications and was told to call back if symptoms returned.  The symptoms have returned and same as before with pressure, burning, increased frequency.  Patient requests more antibiotics and call back at [phone].

## 2011-10-18 NOTE — Telephone Encounter (Signed)
See note below.  Pt had taken macrobid for UTI, finished this Monday a week ago.  She was ok for awhile but started back with burning and pressure last night.  She was referred to urologist but appt isnt till January.  No fever.  [facility]

## 2011-10-20 LAB — URINE CULTURE: Colony Count: 9000

## 2011-10-30 ENCOUNTER — Other Ambulatory Visit: Payer: Self-pay | Admitting: *Deleted

## 2011-10-30 MED ORDER — LEVOTHYROXINE SODIUM 88 MCG PO TABS: 88.0000 ug | ORAL_TABLET | Freq: Every day | ORAL | Status: DC

## 2011-10-30 MED ORDER — OMEPRAZOLE 40 MG PO CPDR: 40.0000 mg | DELAYED_RELEASE_CAPSULE | Freq: Every day | ORAL | Status: DC

## 2011-10-30 NOTE — Telephone Encounter (Signed)
Received faxed refill request from pharmacy. Refill sent to pharmacy electronically. 

## 2011-10-30 NOTE — Telephone Encounter (Signed)
Received faxed refill request from pharmacy. Is it okay to refill medication? 

## 2011-10-31 MED ORDER — ALPRAZOLAM 0.25 MG PO TABS: 0.2500 mg | ORAL_TABLET | Freq: Three times a day (TID) | ORAL | Status: DC | PRN

## 2011-10-31 NOTE — Telephone Encounter (Signed)
Please call in

## 2011-11-01 ENCOUNTER — Encounter: Payer: Self-pay | Admitting: Radiology

## 2011-11-01 DIAGNOSIS — N390 Urinary tract infection, site not specified: Secondary | ICD-10-CM | POA: Insufficient documentation

## 2011-11-01 NOTE — Telephone Encounter (Signed)
Medication phoned to pharmacy.  

## 2011-11-27 ENCOUNTER — Telehealth: Payer: Self-pay | Admitting: Internal Medicine

## 2011-11-27 NOTE — Telephone Encounter (Signed)
Patient advised.

## 2011-11-27 NOTE — Telephone Encounter (Signed)
She can keep taking the loratadine if she has a runny nose and she can get generic mucinex for the congestion.  That would be worth trying, along with rest and fluids.  Thanks.

## 2011-11-27 NOTE — Telephone Encounter (Signed)
Patient called and stated that she has head congestion and draining down her throat,  No fever, no body aches or chills.  She is taking Tylenol, I advised her since we didn't have any openings to go and see the pharmacist and see what  He suggest she take or go to urgent care.

## 2011-12-29 ENCOUNTER — Other Ambulatory Visit: Payer: Self-pay | Admitting: *Deleted

## 2011-12-29 NOTE — Telephone Encounter (Signed)
Faxed refill request   

## 2011-12-31 MED ORDER — HYDROCODONE-ACETAMINOPHEN 2.5-500 MG PO TABS: ORAL_TABLET | ORAL | Status: DC

## 2011-12-31 NOTE — Telephone Encounter (Signed)
Please call in

## 2012-01-01 NOTE — Telephone Encounter (Signed)
Medication phoned to pharmacy.  

## 2012-01-24 ENCOUNTER — Other Ambulatory Visit: Payer: Self-pay | Admitting: *Deleted

## 2012-01-24 MED ORDER — MIRTAZAPINE 15 MG PO TABS: 15.0000 mg | ORAL_TABLET | Freq: Every day | ORAL | Status: DC

## 2012-01-24 NOTE — Telephone Encounter (Signed)
Med sent.

## 2012-02-21 ENCOUNTER — Telehealth: Payer: Self-pay | Admitting: *Deleted

## 2012-02-21 MED ORDER — OMEPRAZOLE 40 MG PO CPDR: 40.0000 mg | DELAYED_RELEASE_CAPSULE | Freq: Every day | ORAL | Status: DC

## 2012-02-21 MED ORDER — LEVOTHYROXINE SODIUM 88 MCG PO TABS: 88.0000 ug | ORAL_TABLET | Freq: Every day | ORAL | Status: DC

## 2012-02-21 NOTE — Telephone Encounter (Signed)
Left message on machine to call back  

## 2012-02-21 NOTE — Telephone Encounter (Signed)
Patient notified as instructed by telephone. Patient transferred to [facility] to schedule appointments.

## 2012-02-21 NOTE — Telephone Encounter (Signed)
Refill request for levothyroxine from [facility], last filled 01/24/12.  TSH last checked one year ago.  Pt has no upcoming appts scheduled.

## 2012-02-21 NOTE — Telephone Encounter (Signed)
rx sent, needs a physical and labs this summer.

## 2012-02-22 ENCOUNTER — Ambulatory Visit (INDEPENDENT_AMBULATORY_CARE_PROVIDER_SITE_OTHER): Payer: MEDICARE | Admitting: Family Medicine

## 2012-02-22 ENCOUNTER — Encounter: Payer: Self-pay | Admitting: Family Medicine

## 2012-02-22 ENCOUNTER — Telehealth: Payer: Self-pay

## 2012-02-22 VITALS — BP 148/94 | HR 94 | Temp 98.1°F | Wt 132.0 lb

## 2012-02-22 DIAGNOSIS — Z23 Encounter for immunization: Secondary | ICD-10-CM

## 2012-02-22 DIAGNOSIS — W19XXXA Unspecified fall, initial encounter: Secondary | ICD-10-CM

## 2012-02-22 DIAGNOSIS — S0100XA Unspecified open wound of scalp, initial encounter: Secondary | ICD-10-CM

## 2012-02-22 DIAGNOSIS — S0101XA Laceration without foreign body of scalp, initial encounter: Secondary | ICD-10-CM

## 2012-02-22 NOTE — Patient Instructions (Signed)
Keep the area clean and dry but don't scrub it.  Come back in 1 week for staple removal.  If you have spreading redness or drainage, then you'll need to be seen sooner.  Take care.

## 2012-02-22 NOTE — Telephone Encounter (Signed)
Pt walked in. Pt fell and hit head on bedside table this AM between 7:30 and 8:00 am. Paramedics called and suggested go to family dr. Rock Nephew has approx 1" cut on crown of head. No N&V, dizziness. Head hurts at site of cut but no h/a. Pt said did not lose consciousness. Pt not sure when last tetanus.  Dr Para March said would see pt.Pt taken to treatment room.

## 2012-02-25 DIAGNOSIS — S0101XA Laceration without foreign body of scalp, initial encounter: Secondary | ICD-10-CM | POA: Insufficient documentation

## 2012-02-25 NOTE — Progress Notes (Signed)
Slipped and went to ground at home, just before arrival.  No LOC.  Abraded her scalp; cleaned the area at home.  Friend noted the skin lesion and brought her for eval/tx.  No focal neuro changes, no complaints other than the skin injury.   Meds, vitals, and allergies reviewed.   ROS: See HPI.  Otherwise, noncontributory.  nad Ncat with no bruising or skin disruption except for a clean linear skin tear that is superficial and on superior aspect of scalp.  Speech, motor exam x4 wnl

## 2012-02-25 NOTE — Assessment & Plan Note (Signed)
2 inch laceration.  Clean based.  Informed consent obtained.  Irrigated copiously with saline and nearby hear trimmed adequately.  Area inspected, no FB noted.  No nonviable tissue to resect.  Numbed with 1% lidocaine and close with 11 staples in linear fashion with good closure and hemostasis.  Tolerated and bandaged, no complications.  Routine instructions given and return in 1 week for removal.  She understood.

## 2012-03-01 ENCOUNTER — Ambulatory Visit (INDEPENDENT_AMBULATORY_CARE_PROVIDER_SITE_OTHER): Payer: MEDICARE | Admitting: Family Medicine

## 2012-03-01 ENCOUNTER — Encounter: Payer: Self-pay | Admitting: Family Medicine

## 2012-03-01 VITALS — BP 146/80 | HR 87 | Temp 97.4°F | Wt 136.8 lb

## 2012-03-01 DIAGNOSIS — S0100XA Unspecified open wound of scalp, initial encounter: Secondary | ICD-10-CM

## 2012-03-01 DIAGNOSIS — S0101XA Laceration without foreign body of scalp, initial encounter: Secondary | ICD-10-CM

## 2012-03-01 NOTE — Patient Instructions (Signed)
Gently shower and clean the area, but don't scrub.  Take care.  No charge.

## 2012-03-03 NOTE — Progress Notes (Signed)
F/u for staple removal.  Placed 8 days ago for scalp lac.  Tolerated well in meantime w/o complication.    Meds, vitals, and allergies reviewed.   ROS: See HPI.  Otherwise, noncontributory.  nad Scalp lac with good tissue adherence.  11 staples removed and tolerated well Still with good tissue closure.

## 2012-03-03 NOTE — Assessment & Plan Note (Signed)
Staples removed, no sign of infection.  Good tissue healing, routine instructions given.  F/u prn.

## 2012-03-15 ENCOUNTER — Ambulatory Visit (INDEPENDENT_AMBULATORY_CARE_PROVIDER_SITE_OTHER): Payer: MEDICARE | Admitting: Family Medicine

## 2012-03-15 ENCOUNTER — Encounter: Payer: Self-pay | Admitting: Family Medicine

## 2012-03-15 VITALS — BP 136/84 | HR 93 | Temp 97.7°F | Wt 135.0 lb

## 2012-03-15 DIAGNOSIS — N390 Urinary tract infection, site not specified: Secondary | ICD-10-CM

## 2012-03-15 DIAGNOSIS — R300 Dysuria: Secondary | ICD-10-CM

## 2012-03-15 LAB — POCT URINALYSIS DIPSTICK
Bilirubin, UA: NEGATIVE
Glucose, UA: NEGATIVE
Ketones, UA: NEGATIVE
Nitrite, UA: NEGATIVE
Protein, UA: NEGATIVE
Spec Grav, UA: <=1.005
Urobilinogen, UA: NEGATIVE
pH, UA: 6

## 2012-03-15 MED ORDER — NITROFURANTOIN MONOHYD MACRO 100 MG PO CAPS: 100.0000 mg | ORAL_CAPSULE | Freq: Two times a day (BID) | ORAL | Status: AC

## 2012-03-15 NOTE — Patient Instructions (Signed)
Drink plenty of water and start the antibiotics today.  We'll contact you with your lab report.  Take care.   

## 2012-03-15 NOTE — Progress Notes (Signed)
Dysuria: yes duration of symptoms: a few days abdominal pain:no fevers:no back pain:at baseline Urinary urgency.    Meds, vitals, and allergies reviewed.   ROS: See HPI.  Otherwise negative.    GEN: nad, alert and oriented CV: rrr.  PULM: ctab, no inc wob ABD: soft, +bs, suprapubic area not tender EXT: no edema SKIN: no acute rash, prev scalp lac with good tissue adherence BACK: no CVA pain

## 2012-03-18 LAB — URINE CULTURE: Colony Count: >=100000

## 2012-03-18 NOTE — Assessment & Plan Note (Signed)
ucx with kleb pneumo, intermed to nitrofurantion.  Will change abx if sx persist. Fluids in meantime.  Nontoxic.  No sign of pyelo.

## 2012-03-21 ENCOUNTER — Other Ambulatory Visit: Payer: Self-pay | Admitting: *Deleted

## 2012-03-21 MED ORDER — CILIDINIUM-CHLORDIAZEPOXIDE 2.5-5 MG PO CAPS: 1.0000 | ORAL_CAPSULE | Freq: Two times a day (BID) | ORAL | Status: DC | PRN

## 2012-03-21 NOTE — Telephone Encounter (Signed)
Sent!

## 2012-03-21 NOTE — Telephone Encounter (Signed)
Faxed refill request from [facility].   

## 2012-03-26 ENCOUNTER — Other Ambulatory Visit: Payer: Self-pay | Admitting: *Deleted

## 2012-03-26 ENCOUNTER — Telehealth: Payer: Self-pay

## 2012-03-26 MED ORDER — LISINOPRIL 10 MG PO TABS: 10.0000 mg | ORAL_TABLET | Freq: Every evening | ORAL | Status: DC

## 2012-03-26 MED ORDER — SULFAMETHOXAZOLE-TRIMETHOPRIM 800-160 MG PO TABS: 1.0000 | ORAL_TABLET | Freq: Two times a day (BID) | ORAL | Status: AC

## 2012-03-26 NOTE — Telephone Encounter (Signed)
Change to septra based on the last ucx.  rx sent.  Notify us if not resolved.  Thanks.

## 2012-03-26 NOTE — Telephone Encounter (Signed)
Patient advised.

## 2012-03-26 NOTE — Telephone Encounter (Signed)
Pt seen 03/15/12 with UTI. Pt said symptoms resolved after taking med. On 03/25/12 began with pressure feeling and burning when urinate, frequency of urine. No fever, no abdominal or back pain but both legs hurt. Pt uses [facility] if needed and pt can be reached [phone].

## 2012-05-07 ENCOUNTER — Other Ambulatory Visit: Payer: Self-pay | Admitting: Family Medicine

## 2012-05-07 DIAGNOSIS — E039 Hypothyroidism, unspecified: Secondary | ICD-10-CM

## 2012-05-07 DIAGNOSIS — I10 Essential (primary) hypertension: Secondary | ICD-10-CM

## 2012-05-13 ENCOUNTER — Other Ambulatory Visit (INDEPENDENT_AMBULATORY_CARE_PROVIDER_SITE_OTHER): Payer: MEDICARE

## 2012-05-13 DIAGNOSIS — E039 Hypothyroidism, unspecified: Secondary | ICD-10-CM

## 2012-05-13 DIAGNOSIS — I10 Essential (primary) hypertension: Secondary | ICD-10-CM

## 2012-05-13 LAB — LIPID PANEL
Cholesterol: 196 mg/dL (ref 0–200)
HDL: 55.5 mg/dL (ref >39.00)
LDL Cholesterol: 121 mg/dL — ABNORMAL HIGH (ref 0–99)
Total CHOL/HDL Ratio: 4
Triglycerides: 97 mg/dL (ref 0.0–149.0)
VLDL: 19.4 mg/dL (ref 0.0–40.0)

## 2012-05-13 LAB — COMPREHENSIVE METABOLIC PANEL
ALT: 13 U/L (ref 0–35)
AST: 23 U/L (ref 0–37)
Albumin: 3.7 g/dL (ref 3.5–5.2)
Alkaline Phosphatase: 80 U/L (ref 39–117)
BUN: 11 mg/dL (ref 6–23)
CO2: 29 mEq/L (ref 19–32)
Calcium: 8.6 mg/dL (ref 8.4–10.5)
Chloride: 106 mEq/L (ref 96–112)
Creatinine, Ser: 0.9 mg/dL (ref 0.4–1.2)
GFR: 64.9 mL/min (ref >60.00)
Glucose, Bld: 95 mg/dL (ref 70–99)
Potassium: 4.3 mEq/L (ref 3.5–5.1)
Sodium: 143 mEq/L (ref 135–145)
Total Bilirubin: 0.3 mg/dL (ref 0.3–1.2)
Total Protein: 7.3 g/dL (ref 6.0–8.3)

## 2012-05-13 LAB — TSH: TSH: 0.35 u[IU]/mL (ref 0.35–5.50)

## 2012-05-20 ENCOUNTER — Ambulatory Visit (INDEPENDENT_AMBULATORY_CARE_PROVIDER_SITE_OTHER): Payer: MEDICARE | Admitting: Family Medicine

## 2012-05-20 ENCOUNTER — Encounter: Payer: Self-pay | Admitting: Family Medicine

## 2012-05-20 VITALS — BP 166/90 | HR 95 | Temp 98.2°F | Wt 131.0 lb

## 2012-05-20 DIAGNOSIS — M8580 Other specified disorders of bone density and structure, unspecified site: Secondary | ICD-10-CM

## 2012-05-20 DIAGNOSIS — Z1231 Encounter for screening mammogram for malignant neoplasm of breast: Secondary | ICD-10-CM

## 2012-05-20 DIAGNOSIS — I10 Essential (primary) hypertension: Secondary | ICD-10-CM

## 2012-05-20 DIAGNOSIS — E785 Hyperlipidemia, unspecified: Secondary | ICD-10-CM

## 2012-05-20 DIAGNOSIS — E039 Hypothyroidism, unspecified: Secondary | ICD-10-CM

## 2012-05-20 DIAGNOSIS — Z0000 Encounter for general adult medical examination without abnormal findings: Secondary | ICD-10-CM

## 2012-05-20 DIAGNOSIS — M549 Dorsalgia, unspecified: Secondary | ICD-10-CM

## 2012-05-20 DIAGNOSIS — M899 Disorder of bone, unspecified: Secondary | ICD-10-CM

## 2012-05-20 DIAGNOSIS — K2270 Barrett's esophagus without dysplasia: Secondary | ICD-10-CM

## 2012-05-20 DIAGNOSIS — I4891 Unspecified atrial fibrillation: Secondary | ICD-10-CM

## 2012-05-20 DIAGNOSIS — M949 Disorder of cartilage, unspecified: Secondary | ICD-10-CM

## 2012-05-20 NOTE — Assessment & Plan Note (Signed)
TSH wnl and benign neck exam.

## 2012-05-20 NOTE — Patient Instructions (Addendum)
I would get a flu shot each fall.   Call [facility] hospital about your mammogram.   Call about the eye exam.  See Shirlee Limerick about your referral before you leave today. Take care.  Glad to see you.

## 2012-05-20 NOTE — Progress Notes (Signed)
I have personally reviewed the Medicare Annual Wellness questionnaire and have noted 1. The patient's medical and social history 2. Their use of alcohol, tobacco or illicit drugs 3. Their current medications and supplements 4. The patient's functional ability including ADL's, fall risks, home safety risks and hearing or visual             impairment. 5. Diet and physical activities 6. Evidence for depression or mood disorders  The patients weight, height, BMI have been recorded in the chart and visual acuity is per eye clinic.  I have made referrals, counseling and provided education to the patient based review of the above and I have provided the pt with a written personalized care plan for preventive services.  See scanned forms.  Routine anticipatory guidance given to patient.  See health maintenance. Tetanus 2013 Flu yearly Shingles 2011 PNA prev done per patient after age 45 Colon cancer screening. 2012 Breast cancer screening.  Mammogram pending.  Advance directive.  No formal papers.  Per patient she would want her sister in law Alona Bene Apple) to act on her behalf.   DXA due.  H/o osteopenia.   Back pain controlled.  H/o hip fracture.  Takes current meds w/o ADE.  Doing well, functional with current meds.   H/o thyroid cancer. TSH wnl.  No neck mass.  No new symptoms.   Hypertension:    Using medication without problems or lightheadedness: yes Chest pain with exertion:no Edema:no Short of breath:no  PMH and SH reviewed  Meds, vitals, and allergies reviewed.   ROS: See HPI.  Otherwise negative.    GEN: nad, alert and oriented HEENT: mucous membranes moist NECK: supple w/o LA, no neck mass CV: rrr. PULM: ctab, no inc wob ABD: soft, +bs EXT: no edema SKIN: no acute rash

## 2012-05-20 NOTE — Assessment & Plan Note (Signed)
Controlled, continue as is.  

## 2012-05-20 NOTE — Assessment & Plan Note (Signed)
No recent sx, RRR today.

## 2012-05-20 NOTE — Assessment & Plan Note (Addendum)
Recheck BP by MD 130/86. She had been rushing to get to OV here after dental visit.  Continue current meds.  Labs d/w pt along with diet and exercise.

## 2012-05-20 NOTE — Assessment & Plan Note (Signed)
Controlled, continue current meds.  No ADE.   

## 2012-05-20 NOTE — Assessment & Plan Note (Signed)
Continue on PPI, doing well.

## 2012-05-24 ENCOUNTER — Other Ambulatory Visit: Payer: Self-pay | Admitting: *Deleted

## 2012-05-24 MED ORDER — HYDROCODONE-ACETAMINOPHEN 2.5-500 MG PO TABS: ORAL_TABLET | ORAL | Status: DC

## 2012-05-24 MED ORDER — ALPRAZOLAM 0.25 MG PO TABS: 0.2500 mg | ORAL_TABLET | Freq: Three times a day (TID) | ORAL | Status: DC | PRN

## 2012-05-24 NOTE — Telephone Encounter (Signed)
Faxed refill request from [facility], both meds filled last on 04/23/12.

## 2012-05-24 NOTE — Telephone Encounter (Signed)
Medication phoned to pharmacy.  

## 2012-05-24 NOTE — Telephone Encounter (Signed)
Please call in

## 2012-06-03 ENCOUNTER — Encounter: Payer: Self-pay | Admitting: Family Medicine

## 2012-06-03 ENCOUNTER — Ambulatory Visit (INDEPENDENT_AMBULATORY_CARE_PROVIDER_SITE_OTHER): Payer: MEDICARE | Admitting: Family Medicine

## 2012-06-03 VITALS — BP 120/76 | HR 84 | Temp 97.5°F | Wt 131.5 lb

## 2012-06-03 DIAGNOSIS — H6120 Impacted cerumen, unspecified ear: Secondary | ICD-10-CM

## 2012-06-03 NOTE — Patient Instructions (Addendum)
Take care.  Glad to see you.  

## 2012-06-03 NOTE — Assessment & Plan Note (Signed)
Resolved with curette.  No complications.  Canal and TM wnl on recheck.  F/u prn.

## 2012-06-03 NOTE — Progress Notes (Signed)
L ear stuffy and now tender.  No new R ear sx.  No drainage from the ear.  No FCNAVD.  No rhinorrhea, no ST.    Meds, vitals, and allergies reviewed.   ROS: See HPI.  Otherwise, noncontributory.  nad ncat L cerumen impaction, resolved with curette.  Tolerated well. Recheck w/o erythema in canal.

## 2012-06-23 ENCOUNTER — Other Ambulatory Visit: Payer: Self-pay | Admitting: Family Medicine

## 2012-06-23 DIAGNOSIS — M8580 Other specified disorders of bone density and structure, unspecified site: Secondary | ICD-10-CM

## 2012-06-27 ENCOUNTER — Ambulatory Visit: Payer: Self-pay | Admitting: Family Medicine

## 2012-07-03 ENCOUNTER — Encounter: Payer: Self-pay | Admitting: Family Medicine

## 2012-07-04 ENCOUNTER — Encounter: Payer: Self-pay | Admitting: *Deleted

## 2012-07-25 ENCOUNTER — Other Ambulatory Visit: Payer: Self-pay | Admitting: *Deleted

## 2012-07-25 MED ORDER — MIRTAZAPINE 15 MG PO TABS: 15.0000 mg | ORAL_TABLET | Freq: Every day | ORAL | Status: DC

## 2012-07-25 NOTE — Telephone Encounter (Signed)
Faxed refill request   

## 2012-07-25 NOTE — Telephone Encounter (Signed)
Sent!

## 2012-08-26 ENCOUNTER — Other Ambulatory Visit: Payer: Self-pay | Admitting: *Deleted

## 2012-08-26 MED ORDER — LEVOTHYROXINE SODIUM 88 MCG PO TABS: 88.0000 ug | ORAL_TABLET | Freq: Every day | ORAL | Status: DC

## 2012-09-23 ENCOUNTER — Ambulatory Visit (INDEPENDENT_AMBULATORY_CARE_PROVIDER_SITE_OTHER): Payer: Medicare Other

## 2012-09-23 DIAGNOSIS — Z23 Encounter for immunization: Secondary | ICD-10-CM

## 2012-09-25 ENCOUNTER — Other Ambulatory Visit: Payer: Self-pay | Admitting: *Deleted

## 2012-09-25 MED ORDER — HYDROCODONE-ACETAMINOPHEN 2.5-500 MG PO TABS: ORAL_TABLET | ORAL | Status: DC

## 2012-09-25 MED ORDER — OMEPRAZOLE 40 MG PO CPDR: 40.0000 mg | DELAYED_RELEASE_CAPSULE | Freq: Every day | ORAL | Status: DC

## 2012-09-25 NOTE — Telephone Encounter (Signed)
rx called into pharmacy

## 2012-09-25 NOTE — Telephone Encounter (Signed)
Please call in

## 2012-10-23 ENCOUNTER — Other Ambulatory Visit: Payer: Self-pay | Admitting: *Deleted

## 2012-10-23 DIAGNOSIS — F411 Generalized anxiety disorder: Secondary | ICD-10-CM

## 2012-10-23 MED ORDER — ALPRAZOLAM 0.25 MG PO TABS: 0.2500 mg | ORAL_TABLET | Freq: Three times a day (TID) | ORAL | Status: DC | PRN

## 2012-10-23 MED ORDER — LISINOPRIL 10 MG PO TABS: 10.0000 mg | ORAL_TABLET | Freq: Every evening | ORAL | Status: DC

## 2012-10-23 NOTE — Telephone Encounter (Signed)
Please call in

## 2012-10-23 NOTE — Telephone Encounter (Signed)
Rx called to pharmacy as instructed. 

## 2012-12-17 ENCOUNTER — Other Ambulatory Visit: Payer: Self-pay | Admitting: *Deleted

## 2012-12-17 MED ORDER — HYDROCODONE-ACETAMINOPHEN 2.5-500 MG PO TABS: ORAL_TABLET | ORAL | Status: DC

## 2012-12-17 NOTE — Telephone Encounter (Signed)
Faxed refill request   

## 2012-12-17 NOTE — Telephone Encounter (Signed)
Please call in

## 2012-12-17 NOTE — Telephone Encounter (Signed)
Rx phoned to pharmacy.  

## 2013-01-17 ENCOUNTER — Other Ambulatory Visit: Payer: Self-pay | Admitting: *Deleted

## 2013-01-17 MED ORDER — MIRTAZAPINE 15 MG PO TABS: 15.0000 mg | ORAL_TABLET | Freq: Every day | ORAL | Status: DC

## 2013-01-17 NOTE — Telephone Encounter (Signed)
Last filled 10/27/2013 

## 2013-02-02 ENCOUNTER — Emergency Department (HOSPITAL_COMMUNITY): Payer: Medicare Other

## 2013-02-02 ENCOUNTER — Encounter (HOSPITAL_COMMUNITY): Payer: Self-pay

## 2013-02-02 ENCOUNTER — Emergency Department (HOSPITAL_COMMUNITY)
Admission: EM | Admit: 2013-02-02 | Discharge: 2013-02-02 | Disposition: A | Discharge status: Home / Self Care | Payer: Medicare Other | Attending: Emergency Medicine | Admitting: Emergency Medicine

## 2013-02-02 DIAGNOSIS — Z860100 Personal history of colon polyps, unspecified: Secondary | ICD-10-CM | POA: Insufficient documentation

## 2013-02-02 DIAGNOSIS — I10 Essential (primary) hypertension: Secondary | ICD-10-CM | POA: Insufficient documentation

## 2013-02-02 DIAGNOSIS — Z8719 Personal history of other diseases of the digestive system: Secondary | ICD-10-CM | POA: Insufficient documentation

## 2013-02-02 DIAGNOSIS — J3489 Other specified disorders of nose and nasal sinuses: Secondary | ICD-10-CM | POA: Insufficient documentation

## 2013-02-02 DIAGNOSIS — J209 Acute bronchitis, unspecified: Secondary | ICD-10-CM

## 2013-02-02 DIAGNOSIS — Z87440 Personal history of urinary (tract) infections: Secondary | ICD-10-CM | POA: Insufficient documentation

## 2013-02-02 DIAGNOSIS — R059 Cough, unspecified: Secondary | ICD-10-CM | POA: Insufficient documentation

## 2013-02-02 DIAGNOSIS — E785 Hyperlipidemia, unspecified: Secondary | ICD-10-CM | POA: Insufficient documentation

## 2013-02-02 DIAGNOSIS — R05 Cough: Secondary | ICD-10-CM | POA: Insufficient documentation

## 2013-02-02 DIAGNOSIS — Z85850 Personal history of malignant neoplasm of thyroid: Secondary | ICD-10-CM | POA: Insufficient documentation

## 2013-02-02 DIAGNOSIS — R509 Fever, unspecified: Secondary | ICD-10-CM | POA: Insufficient documentation

## 2013-02-02 DIAGNOSIS — Z79899 Other long term (current) drug therapy: Secondary | ICD-10-CM | POA: Insufficient documentation

## 2013-02-02 DIAGNOSIS — M940 Chondrocostal junction syndrome [Tietze]: Secondary | ICD-10-CM

## 2013-02-02 DIAGNOSIS — E039 Hypothyroidism, unspecified: Secondary | ICD-10-CM | POA: Insufficient documentation

## 2013-02-02 LAB — CBC WITH DIFFERENTIAL/PLATELET
Basophils Absolute: 0 10*3/uL (ref 0.0–0.1)
Basophils Relative: 0 % (ref 0–1)
Eosinophils Absolute: 0.1 10*3/uL (ref 0.0–0.7)
Eosinophils Relative: 1 % (ref 0–5)
HCT: 36.2 % (ref 36.0–46.0)
Hemoglobin: 13 g/dL (ref 12.0–15.0)
Lymphocytes Relative: 19 % (ref 12–46)
Lymphs Abs: 2.6 10*3/uL (ref 0.7–4.0)
MCH: 31.4 pg (ref 26.0–34.0)
MCHC: 35.9 g/dL (ref 30.0–36.0)
MCV: 87.4 fL (ref 78.0–100.0)
Monocytes Absolute: 1.2 10*3/uL — ABNORMAL HIGH (ref 0.1–1.0)
Monocytes Relative: 9 % (ref 3–12)
Neutro Abs: 9.6 10*3/uL — ABNORMAL HIGH (ref 1.7–7.7)
Neutrophils Relative %: 72 % (ref 43–77)
Platelets: 195 10*3/uL (ref 150–400)
RBC: 4.14 MIL/uL (ref 3.87–5.11)
RDW: 12.8 % (ref 11.5–15.5)
WBC: 13.5 10*3/uL — ABNORMAL HIGH (ref 4.0–10.5)

## 2013-02-02 LAB — BASIC METABOLIC PANEL
BUN: 19 mg/dL (ref 6–23)
CO2: 24 mEq/L (ref 19–32)
Calcium: 8.8 mg/dL (ref 8.4–10.5)
Chloride: 98 mEq/L (ref 96–112)
Creatinine, Ser: 1.17 mg/dL — ABNORMAL HIGH (ref 0.50–1.10)
GFR calc Af Amer: 50 mL/min — ABNORMAL LOW (ref >90)
GFR calc non Af Amer: 43 mL/min — ABNORMAL LOW (ref >90)
Glucose, Bld: 101 mg/dL — ABNORMAL HIGH (ref 70–99)
Potassium: 3.4 mEq/L — ABNORMAL LOW (ref 3.5–5.1)
Sodium: 136 mEq/L (ref 135–145)

## 2013-02-02 LAB — TROPONIN I: Troponin I: <0.3 ng/mL (ref <0.30)

## 2013-02-02 MED ORDER — HYDROCODONE-ACETAMINOPHEN 5-325 MG PO TABS
1.0000 | ORAL_TABLET | Freq: Once | ORAL | Status: AC
  Administered 2013-02-02: 1 via ORAL
  Filled 2013-02-02: qty 1

## 2013-02-02 MED ORDER — IBUPROFEN 400 MG PO TABS: 600.0000 mg | ORAL_TABLET | Freq: Once | ORAL | Status: DC

## 2013-02-02 MED ORDER — ONDANSETRON 4 MG PO TBDP: ORAL_TABLET | ORAL | Status: DC

## 2013-02-02 MED ORDER — ALBUTEROL SULFATE (5 MG/ML) 0.5% IN NEBU: 5.0000 mg | INHALATION_SOLUTION | Freq: Once | RESPIRATORY_TRACT | Status: AC

## 2013-02-02 MED ORDER — PREDNISONE 20 MG PO TABS: ORAL_TABLET | ORAL | Status: DC

## 2013-02-02 MED ORDER — ALBUTEROL SULFATE (5 MG/ML) 0.5% IN NEBU
INHALATION_SOLUTION | RESPIRATORY_TRACT | Status: AC
  Administered 2013-02-02: 5 mg via RESPIRATORY_TRACT
  Filled 2013-02-02: qty 1

## 2013-02-02 MED ORDER — BENZONATATE 100 MG PO CAPS: 100.0000 mg | ORAL_CAPSULE | Freq: Three times a day (TID) | ORAL | Status: DC

## 2013-02-02 NOTE — ED Notes (Signed)
Pt given discharge paperwork; pt verbalized understanding of d/c and f/u; no additional questions by pt regarding d/c; VSS; resps e/u; e-signature obtained; pt ambulatory on discharge;

## 2013-02-02 NOTE — ED Notes (Signed)
Pt done with breathing treatment; pt states she is feeling better and breathing better now after treatment;

## 2013-02-02 NOTE — ED Notes (Signed)
While Doctor at bedside with patient. Patient developed sudden onset of bronchospasm with cough.

## 2013-02-02 NOTE — ED Notes (Signed)
Pt has a dry hacking cough for the past couple of days; last night she had an episode of left sided chest pain that pt describes as sharp; pt took 324 ASA at home; pt also c/o sore throat with drainage and an ear ache; pt reports having a fever of 101 at home; pt states she is not able to cough anything up as it causes chest pain;

## 2013-02-02 NOTE — ED Provider Notes (Signed)
History     CSN: [id number]  Arrival date & time 02/02/13  1607   First MD Initiated Contact with Patient 02/02/13 1625      Chief Complaint  Patient presents with  . Chest Pain    (Consider location/radiation/quality/duration/timing/severity/associated sxs/prior treatment) Patient is a 79 y.o. female presenting with general illness and chest pain.  Illness  The current episode started 2 days ago. The onset was gradual. The problem occurs continuously. The problem has been gradually worsening. The problem is moderate. Nothing relieves the symptoms. Nothing aggravates the symptoms. Associated symptoms include a fever, congestion, rhinorrhea and cough. Pertinent negatives include no photophobia, no abdominal pain, no diarrhea, no nausea, no vomiting, no headaches, no sore throat, no muscle aches, no neck pain and no eye discharge. The fever has been present for less than 1 day. The maximum temperature noted was 101.0 to 102.1 F. The temperature was taken using an oral thermometer. The cough has no precipitants. The cough is non-productive. There is no color change associated with the cough. There is nasal and chest congestion. The congestion does not interfere with sleep. The congestion does not interfere with eating or drinking. She has been behaving normally. She has been drinking less than usual and eating less than usual. Urine output has been normal. The last void occurred less than 6 hours ago. There were no sick contacts. She has received no recent medical care.  Chest Pain Pain location:  Substernal area (just L of sternum) Pain quality: sharp   Pain radiates to:  Does not radiate Pain radiates to the back: no   Pain severity:  Moderate Onset quality:  Sudden (yesterday about at least 12 hours, today since 2pm with onset when pt bent over to pick something up) Duration:  3 hours Timing:  Constant Progression:  Waxing and waning Chronicity:  New Context comment:  Began after she bent  over Relieved by: not coughing. Worsened by:  Coughing and deep breathing Ineffective treatments:  None tried Associated symptoms: cough and fever   Associated symptoms: no abdominal pain, no back pain, no diaphoresis, no fatigue, no headache, no nausea, no numbness, no palpitations, no shortness of breath, not vomiting and no weakness   Risk factors: high cholesterol and hypertension   Risk factors: no aortic disease, no diabetes mellitus, not obese, no prior DVT/PE and no smoking     Past Medical History  Diagnosis Date  . Personal history of colonic polyps     colonoscpy, removed 1995  . Thyroid carcinoma 01/1994  . Hyperlipemia   . Hypertension   . Hypothyroidism   . Hiatal hernia   . Barrett esophagus   . UTI (urinary tract infection)     Chronic  . Hiatal hernia     EGD 1998, 2007 ( Dr. Jarold Motto)  . Diplopia     history of,hospital 2/26-2/27/10  . Diverticulitis     colonoscopy 1995.  CT 01/31/09, 02/02/09    Past Surgical History  Procedure Laterality Date  . Thyroidectomy  04/1994    thyroid cancer  . Cholecystectomy    . Hip surgery  08/1993    due to fracture of right hip  . Gyn surgery      hysterectomy    Family History  Problem Relation Age of Onset  . Cancer Brother     Pharyngeal   . Heart disease Brother     MI  . Diabetes Brother   . Diabetes Sister     amputation  .  Hypertension Mother   . Stroke Father     History  Substance Use Topics  . Smoking status: Never Smoker   . Smokeless tobacco: Never Used  . Alcohol Use: No    OB History   Grav Para Term Preterm Abortions TAB SAB Ect Mult Living                  Review of Systems  Constitutional: Positive for fever. Negative for chills, diaphoresis, activity change, appetite change and fatigue.  HENT: Positive for congestion and rhinorrhea. Negative for sore throat, facial swelling, neck pain and neck stiffness.   Eyes: Negative for photophobia and discharge.  Respiratory: Positive for  cough. Negative for chest tightness and shortness of breath.   Cardiovascular: Positive for chest pain. Negative for palpitations and leg swelling.  Gastrointestinal: Negative for nausea, vomiting, abdominal pain and diarrhea.  Endocrine: Negative for polydipsia and polyuria.  Genitourinary: Negative for dysuria, frequency, difficulty urinating and pelvic pain.  Musculoskeletal: Negative for back pain and arthralgias.  Skin: Negative for color change and wound.  Allergic/Immunologic: Negative for immunocompromised state.  Neurological: Negative for facial asymmetry, weakness, numbness and headaches.  Hematological: Does not bruise/bleed easily.  Psychiatric/Behavioral: Negative for confusion and agitation.    Allergies  Ciprofloxacin; Cortisone acetate; Gabapentin; Metoclopramide hcl; Penicillins; and Promethazine hcl  Home Medications   Current Outpatient Rx  Name  Route  Sig  Dispense  Refill  . acetaminophen (TYLENOL) 500 MG tablet   Oral   Take 1,000 mg by mouth every 6 (six) hours as needed for pain.         Marland Kitchen ALPRAZolam (XANAX) 0.25 MG tablet   Oral   Take 0.25 mg by mouth 2 (two) times daily.         . clidinium-chlordiazePOXIDE ([name]) 2.5-5 MG per capsule   Oral   Take 1 capsule by mouth daily.         Marland Kitchen HYDROcodone-acetaminophen (VICODIN) 2.5-500 MG per tablet   Oral   Take 0.5-1 tablets by mouth every 6 (six) hours as needed for pain.         Marland Kitchen levothyroxine (SYNTHROID, LEVOTHROID) 88 MCG tablet   Oral   Take 1 tablet (88 mcg total) by mouth daily.   30 tablet   10   . lisinopril (PRINIVIL,ZESTRIL) 10 MG tablet   Oral   Take 1 tablet (10 mg total) by mouth Nightly.   30 tablet   6   . loratadine ([employer] ALLERGY RELIEF) 10 MG dissolvable tablet   Oral   Take 10 mg by mouth daily as needed for allergies.          . mirtazapine (REMERON) 15 MG tablet   Oral   Take 1 tablet (15 mg total) by mouth at bedtime.   30 tablet   5   . Multiple Vitamin  (MULTIVITAMIN PO)   Oral   Take by mouth daily.           Marland Kitchen omeprazole (PRILOSEC) 40 MG capsule   Oral   Take 1 capsule (40 mg total) by mouth daily. One tab by mouth before breakfast   30 capsule   6   . polyethylene glycol powder (MIRALAX) powder   Oral   Take 17 g by mouth every evening.          Marland Kitchen aspirin EC 81 MG tablet   Oral   Take 81 mg by mouth daily.         Marland Kitchen  benzonatate (TESSALON) 100 MG capsule   Oral   Take 1 capsule (100 mg total) by mouth every 8 (eight) hours.   21 capsule   0   . ondansetron (ZOFRAN ODT) 4 MG disintegrating tablet      4mg  ODT q4 hours prn nausea/vomiting.  May take if prednisone causes nausea   4 tablet   0   . predniSONE (DELTASONE) 20 MG tablet      3 tabs po day one, then 2 tabs daily x 4 days   11 tablet   0     BP 136/77  Pulse 103  Temp(Src) 97.9 F (36.6 C) (Oral)  Resp 18  SpO2 98%  LMP 03/28/1989  Physical Exam  Constitutional: She is oriented to person, place, and time. She appears well-developed and well-nourished. No distress.  HENT:  Head: Normocephalic and atraumatic.  Mouth/Throat: No oropharyngeal exudate.  Eyes: Pupils are equal, round, and reactive to light.  Neck: Normal range of motion. Neck supple.  Cardiovascular: Normal rate, regular rhythm and normal heart sounds.  Exam reveals no gallop and no friction rub.   No murmur heard. Pulmonary/Chest: Effort normal and breath sounds normal. No respiratory distress. She has no wheezes. She has no rales. She exhibits tenderness.    Abdominal: Soft. Bowel sounds are normal. She exhibits no distension and no mass. There is no tenderness. There is no rebound and no guarding.  Musculoskeletal: Normal range of motion. She exhibits no edema and no tenderness.  Neurological: She is alert and oriented to person, place, and time.  Skin: Skin is warm and dry.  Psychiatric: She has a normal mood and affect.    ED Course  Procedures (including critical  care time)  Labs Reviewed  CBC WITH DIFFERENTIAL - Abnormal; Notable for the following:    WBC 13.5 (*)    Neutro Abs 9.6 (*)    Monocytes Absolute 1.2 (*)    All other components within normal limits  BASIC METABOLIC PANEL - Abnormal; Notable for the following:    Potassium 3.4 (*)    Glucose, Bld 101 (*)    Creatinine, Ser 1.17 (*)    GFR calc non Af Amer 43 (*)    GFR calc Af Amer 50 (*)    All other components within normal limits  TROPONIN I   Dg Chest 2 View  02/02/2013  *RADIOLOGY REPORT*  Clinical Data: Cough, chest pain, fever, hypertension, throat cancer  CHEST - 2 VIEW  Comparison: 06/11/2007  Findings: Normal heart size, mediastinal contours, and pulmonary vascularity. Emphysematous and bronchitic changes. Subsegmental atelectasis left base. No definite infiltrate, pleural effusion or pneumothorax. Minimal biapical scarring. Diffuse osseous demineralization with scattered mild endplate spur formation thoracic spine. Question bone island at a lower thoracic vertebra approximately T11.  IMPRESSION: COPD changes. No acute abnormalities.   Original Report Authenticated By: Ulyses Southward, M.D.      1. Bronchitis, acute   2. Costochondritis      Date: 02/02/2013  Rate: 96  Rhythm: normal sinus rhythm  QRS Axis: normal  Intervals: normal  ST/T Wave abnormalities: nonspecific T wave changes, TW flattening V2, V3  Conduction Disutrbances:possible L atrial abnormality  Narrative Interpretation:   Old EKG Reviewed: none available    MDM   Pt is a 79 y.o. female with pertinent PMHX of HTN, HLD, barret's esophagus, thyroidectomy who presents with 2-3 days of cough, congestion, rhinorrhea, and CP beginning yesterday afternoon and continuing until bed, but was resolved until today at  about 2pm when she reached down to pick something up.  Pain is sharp, localized to L sternal border about 2/3's down, worse w/ cough, palpation, and deep breathing.  Cough not productive.  Reports fever  last pm.  No n/v, d/a, ab pain, urinary complaints.  On exam, pt in NAD, sounds congested, nml O2 sats, HR 90's.  Lungs clear.  +localized ttp at location noted above.  After exam, pt had a 20-30 second episode of likely bronchospasm that was proceeded by coughing where O2 sat briefly dropped to 50's.  Pt placed on O2, and albuterol neb ordered although episode passed before it arrived.  Pt reports she has been having similar episodes for about 15 years since her thyroidectomy as the surgeon "nicked something in my neck".  She reports they are worse w/ stress.  She has an episode about once a month.  Trop negative, CXRc/w COPD, but no acute changes. WBC mildly elevated, Cr slightly elevated from baseline.     8:35PM Pts CP somewhat improved after norco.  100% on RA after ambulatory pulse ox.  I believe CP likely costochondritis and I doubt ACS.  Bronchospastic episodes have been chronic, though rare occurrence.  Given inc cough/congestion and CXR c/w COPD, will put on 5 day steroid burst & will also given tessalon.  Pt can inc home norco from 1/2 tab to 1 tab. Pt will be staying with a neighbor, will follow closely w/ PCP.  Strict return precautions given for new or worsening symptoms.     1. Bronchitis, acute   2. Costochondritis      Labs and imaging considered in decision making, reviewed by myself.  Imaging interpreted by radiology. Pt care discussed with my attending, Dr. Bebe Shaggy.     Toy Cookey, MD 02/04/13 [id number]

## 2013-02-04 NOTE — ED Provider Notes (Signed)
I have personally seen and examined the patient.  I have discussed the plan of care with the resident.  I have reviewed the documentation on PMH/FH/Soc. History.  I have reviewed the documentation of the resident and agree.  I have reviewed and agree with the ECG interpretation(s) documented by the resident.  Pt well appearing, no distress, ambulatory.  Doubt PE or acute cardiopulmonary process.  Stable for d/c  Joya Gaskins, MD 02/04/13 1039

## 2013-02-27 ENCOUNTER — Encounter: Payer: Self-pay | Admitting: Family Medicine

## 2013-02-27 ENCOUNTER — Ambulatory Visit (INDEPENDENT_AMBULATORY_CARE_PROVIDER_SITE_OTHER): Payer: Medicare Other | Admitting: Family Medicine

## 2013-02-27 VITALS — BP 116/82 | HR 97 | Temp 97.7°F | Wt 127.5 lb

## 2013-02-27 DIAGNOSIS — R350 Frequency of micturition: Secondary | ICD-10-CM

## 2013-02-27 DIAGNOSIS — N390 Urinary tract infection, site not specified: Secondary | ICD-10-CM

## 2013-02-27 LAB — POCT URINALYSIS DIPSTICK
Bilirubin, UA: NEGATIVE
Glucose, UA: 100
Ketones, UA: NEGATIVE
Nitrite, UA: NEGATIVE
Protein, UA: 30
Spec Grav, UA: <=1.005
Urobilinogen, UA: 0.2
pH, UA: 6.5

## 2013-02-27 MED ORDER — SULFAMETHOXAZOLE-TRIMETHOPRIM 800-160 MG PO TABS: 1.0000 | ORAL_TABLET | Freq: Two times a day (BID) | ORAL | Status: DC

## 2013-02-27 NOTE — Patient Instructions (Signed)
Drink plenty of water and start the antibiotics today.  We'll contact you with your lab report.  Take care.   

## 2013-02-27 NOTE — Progress Notes (Signed)
dysuria:yes duration of symptoms: >1 week abdominal pain:no fevers:no back pain: at baseline vomiting:no other concerns: no help with cranberry juice and fluids  Meds, vitals, and allergies reviewed.   ROS: See HPI.  Otherwise negative.    GEN: nad, alert and oriented HEENT: mucous membranes moist NECK: supple CV: rrr.  PULM: ctab, no inc wob ABD: soft, +bs, suprapubic area not tender EXT: no edema SKIN: no acute rash BACK: no CVA pain

## 2013-02-27 NOTE — Assessment & Plan Note (Signed)
Nontoxic, ucx, septra and f/u prn.

## 2013-03-01 LAB — URINE CULTURE: Colony Count: >=100000

## 2013-03-13 ENCOUNTER — Other Ambulatory Visit: Payer: Self-pay | Admitting: *Deleted

## 2013-03-13 NOTE — Telephone Encounter (Signed)
Hydrocodone needs prior auth, should the 500mg  dose be 325mg ? Then maybe it will not need prior auth?

## 2013-03-16 MED ORDER — ALPRAZOLAM 0.25 MG PO TABS: 0.2500 mg | ORAL_TABLET | Freq: Two times a day (BID) | ORAL | Status: DC

## 2013-03-16 MED ORDER — HYDROCODONE-ACETAMINOPHEN 2.5-325 MG PO TABS: 0.5000 | ORAL_TABLET | Freq: Four times a day (QID) | ORAL | Status: DC | PRN

## 2013-03-16 NOTE — Telephone Encounter (Signed)
Would change to the 325, please call both in. If she still needs the PA, then I'll work on it when I'm back in clinic.  Thanks.

## 2013-03-17 ENCOUNTER — Other Ambulatory Visit: Payer: Self-pay | Admitting: *Deleted

## 2013-03-17 NOTE — Telephone Encounter (Signed)
Rx phoned to pharmacy.  

## 2013-03-18 ENCOUNTER — Telehealth: Payer: Self-pay | Admitting: *Deleted

## 2013-03-18 MED ORDER — HYDROCODONE-ACETAMINOPHEN 5-325 MG PO TABS: 0.5000 | ORAL_TABLET | Freq: Four times a day (QID) | ORAL | Status: DC | PRN

## 2013-03-18 NOTE — Telephone Encounter (Signed)
I would change to 5/325 and cut that in half.  Please call in.  I changed the med list.

## 2013-03-18 NOTE — Telephone Encounter (Signed)
Rx phoned to pharmacy.  

## 2013-03-18 NOTE — Telephone Encounter (Signed)
[name] at [facility] says they received the phoned in Rx for Hydrocodone 2.5/325 but that strength is not available.  It comes in 5/325 ; 7.5/325 ; and 10/325 .  The tablet is scored so it can be halved but that would also cut the content of the Tylenol.  Please advise.

## 2013-04-09 ENCOUNTER — Ambulatory Visit (INDEPENDENT_AMBULATORY_CARE_PROVIDER_SITE_OTHER): Payer: Medicare Other | Admitting: Family Medicine

## 2013-04-09 ENCOUNTER — Encounter: Payer: Self-pay | Admitting: Family Medicine

## 2013-04-09 VITALS — BP 120/78 | HR 76 | Temp 98.5°F | Wt 129.5 lb

## 2013-04-09 DIAGNOSIS — R300 Dysuria: Secondary | ICD-10-CM

## 2013-04-09 DIAGNOSIS — N390 Urinary tract infection, site not specified: Secondary | ICD-10-CM

## 2013-04-09 LAB — POCT URINALYSIS DIPSTICK
Bilirubin, UA: NEGATIVE
Glucose, UA: NEGATIVE
Ketones, UA: NEGATIVE
Nitrite, UA: NEGATIVE
Spec Grav, UA: 1.025
Urobilinogen, UA: 0.2
pH, UA: 6.5

## 2013-04-09 MED ORDER — SULFAMETHOXAZOLE-TRIMETHOPRIM 800-160 MG PO TABS: 1.0000 | ORAL_TABLET | Freq: Two times a day (BID) | ORAL | Status: DC

## 2013-04-09 NOTE — Progress Notes (Signed)
dysuria:yes duration of symptoms: since last week abdominal pain: occ lower abd pain fevers:no back pain: at baseline Vomiting: no  Meds, vitals, and allergies reviewed.   ROS: See HPI.  Otherwise negative.    GEN: nad, alert and oriented HEENT: mucous membranes moist NECK: supple CV: rrr.  PULM: ctab, no inc wob ABD: soft, +bs, suprapubic area tender EXT: no edema SKIN: no acute rash BACK: no CVA pain  u/a noted.  ucx pending.

## 2013-04-09 NOTE — Patient Instructions (Addendum)
Drink plenty of water and start the antibiotics today.  We'll contact you with your lab report.  Take care.   

## 2013-04-10 NOTE — Assessment & Plan Note (Signed)
Septra, ucx, fluids and fu prn.  Nontoxic.  D/w pt.

## 2013-04-13 LAB — URINE CULTURE: Colony Count: 80000

## 2013-04-15 ENCOUNTER — Other Ambulatory Visit: Payer: Self-pay | Admitting: Family Medicine

## 2013-04-15 MED ORDER — SULFAMETHOXAZOLE-TRIMETHOPRIM 800-160 MG PO TABS: 1.0000 | ORAL_TABLET | Freq: Two times a day (BID) | ORAL | Status: DC

## 2013-04-17 ENCOUNTER — Other Ambulatory Visit: Payer: Self-pay | Admitting: *Deleted

## 2013-04-17 MED ORDER — OMEPRAZOLE 40 MG PO CPDR: 40.0000 mg | DELAYED_RELEASE_CAPSULE | Freq: Every day | ORAL | Status: DC

## 2013-04-17 MED ORDER — CILIDINIUM-CHLORDIAZEPOXIDE 2.5-5 MG PO CAPS: 1.0000 | ORAL_CAPSULE | Freq: Every day | ORAL | Status: DC

## 2013-04-17 NOTE — Telephone Encounter (Signed)
Sent!

## 2013-05-15 ENCOUNTER — Other Ambulatory Visit: Payer: Self-pay | Admitting: Family Medicine

## 2013-05-15 MED ORDER — LISINOPRIL 10 MG PO TABS: 10.0000 mg | ORAL_TABLET | Freq: Every evening | ORAL | Status: DC

## 2013-06-17 ENCOUNTER — Other Ambulatory Visit: Payer: Self-pay | Admitting: Family Medicine

## 2013-06-17 NOTE — Telephone Encounter (Signed)
Electronic refill request.  Please advise. 

## 2013-06-18 NOTE — Telephone Encounter (Signed)
please call in

## 2013-06-18 NOTE — Telephone Encounter (Signed)
Refills called to pharmacy as instructed. 

## 2013-07-16 ENCOUNTER — Other Ambulatory Visit: Payer: Self-pay | Admitting: Family Medicine

## 2013-07-16 NOTE — Telephone Encounter (Signed)
Sent, schedule AMW visit.  Thanks.  

## 2013-07-16 NOTE — Telephone Encounter (Signed)
Ok to refill 

## 2013-07-17 NOTE — Telephone Encounter (Signed)
Patient advised.   She will look at her calendar and call back for appt.

## 2013-08-02 ENCOUNTER — Other Ambulatory Visit: Payer: Self-pay | Admitting: Family Medicine

## 2013-08-02 DIAGNOSIS — I10 Essential (primary) hypertension: Secondary | ICD-10-CM

## 2013-08-06 ENCOUNTER — Encounter: Payer: Self-pay | Admitting: Radiology

## 2013-08-07 ENCOUNTER — Other Ambulatory Visit (INDEPENDENT_AMBULATORY_CARE_PROVIDER_SITE_OTHER): Payer: Medicare Other

## 2013-08-07 DIAGNOSIS — E039 Hypothyroidism, unspecified: Secondary | ICD-10-CM

## 2013-08-07 DIAGNOSIS — Z0000 Encounter for general adult medical examination without abnormal findings: Secondary | ICD-10-CM

## 2013-08-07 DIAGNOSIS — E785 Hyperlipidemia, unspecified: Secondary | ICD-10-CM

## 2013-08-07 DIAGNOSIS — Z85850 Personal history of malignant neoplasm of thyroid: Secondary | ICD-10-CM

## 2013-08-07 DIAGNOSIS — I10 Essential (primary) hypertension: Secondary | ICD-10-CM

## 2013-08-07 LAB — COMPREHENSIVE METABOLIC PANEL
ALT: 10 U/L (ref 0–35)
AST: 23 U/L (ref 0–37)
Albumin: 3.7 g/dL (ref 3.5–5.2)
Alkaline Phosphatase: 81 U/L (ref 39–117)
BUN: 11 mg/dL (ref 6–23)
CO2: 29 mEq/L (ref 19–32)
Calcium: 8.7 mg/dL (ref 8.4–10.5)
Chloride: 106 mEq/L (ref 96–112)
Creatinine, Ser: 0.9 mg/dL (ref 0.4–1.2)
GFR: 65.55 mL/min (ref >60.00)
Glucose, Bld: 92 mg/dL (ref 70–99)
Potassium: 3.9 mEq/L (ref 3.5–5.1)
Sodium: 141 mEq/L (ref 135–145)
Total Bilirubin: 0.6 mg/dL (ref 0.3–1.2)
Total Protein: 7.2 g/dL (ref 6.0–8.3)

## 2013-08-07 LAB — LIPID PANEL
Cholesterol: 231 mg/dL — ABNORMAL HIGH (ref 0–200)
HDL: 48.1 mg/dL (ref >39.00)
Total CHOL/HDL Ratio: 5
Triglycerides: 135 mg/dL (ref 0.0–149.0)
VLDL: 27 mg/dL (ref 0.0–40.0)

## 2013-08-07 LAB — LDL CHOLESTEROL, DIRECT: Direct LDL: 164.5 mg/dL

## 2013-08-07 LAB — TSH: TSH: 1.24 u[IU]/mL (ref 0.35–5.50)

## 2013-08-14 ENCOUNTER — Encounter: Payer: Self-pay | Admitting: Family Medicine

## 2013-08-14 ENCOUNTER — Ambulatory Visit (INDEPENDENT_AMBULATORY_CARE_PROVIDER_SITE_OTHER): Payer: Medicare Other | Admitting: Family Medicine

## 2013-08-14 VITALS — BP 142/78 | HR 86 | Temp 97.6°F | Ht 63.0 in | Wt 133.5 lb

## 2013-08-14 DIAGNOSIS — K2270 Barrett's esophagus without dysplasia: Secondary | ICD-10-CM

## 2013-08-14 DIAGNOSIS — Z0000 Encounter for general adult medical examination without abnormal findings: Secondary | ICD-10-CM

## 2013-08-14 DIAGNOSIS — E785 Hyperlipidemia, unspecified: Secondary | ICD-10-CM

## 2013-08-14 DIAGNOSIS — K589 Irritable bowel syndrome without diarrhea: Secondary | ICD-10-CM

## 2013-08-14 DIAGNOSIS — Z23 Encounter for immunization: Secondary | ICD-10-CM

## 2013-08-14 DIAGNOSIS — F411 Generalized anxiety disorder: Secondary | ICD-10-CM

## 2013-08-14 DIAGNOSIS — I10 Essential (primary) hypertension: Secondary | ICD-10-CM

## 2013-08-14 DIAGNOSIS — Z85850 Personal history of malignant neoplasm of thyroid: Secondary | ICD-10-CM

## 2013-08-14 DIAGNOSIS — M549 Dorsalgia, unspecified: Secondary | ICD-10-CM

## 2013-08-14 MED ORDER — HYDROCODONE-ACETAMINOPHEN 5-325 MG PO TABS: ORAL_TABLET | ORAL | Status: DC

## 2013-08-14 MED ORDER — ALPRAZOLAM 0.25 MG PO TABS: ORAL_TABLET | ORAL | Status: DC

## 2013-08-14 MED ORDER — LISINOPRIL 10 MG PO TABS: 10.0000 mg | ORAL_TABLET | Freq: Every evening | ORAL | Status: DC

## 2013-08-14 MED ORDER — LEVOTHYROXINE SODIUM 88 MCG PO TABS: ORAL_TABLET | ORAL | Status: DC

## 2013-08-14 MED ORDER — MIRTAZAPINE 15 MG PO TABS: ORAL_TABLET | ORAL | Status: DC

## 2013-08-14 MED ORDER — OMEPRAZOLE 40 MG PO CPDR: DELAYED_RELEASE_CAPSULE | ORAL | Status: DC

## 2013-08-14 MED ORDER — CILIDINIUM-CHLORDIAZEPOXIDE 2.5-5 MG PO CAPS: 1.0000 | ORAL_CAPSULE | Freq: Every day | ORAL | Status: DC

## 2013-08-14 NOTE — Patient Instructions (Addendum)
Call about getting a mammogram.  Take your printed prescriptions to [facility]  Take care.  Try to get back to exercising and stretching.  Glad to see you.

## 2013-08-14 NOTE — Progress Notes (Signed)
CPE- See plan.  Routine anticipatory guidance given to patient.  See health maintenance. Her sister and sister in law died in the last year.  Living will d/w pt.  She is the last of her family.  Her son is out of state.  She has a sister in law that she could talk to.  I encouraged her to talk to her family.   Tetanus 2013  Shingles shot 2011 Flu shot done.  PNA shot prev done.  Colonoscopy not indicated.  Pap not indicated.  She is due for a mammogram and she'll call about that.   Chronic back pain. H/o hip fracture. Takes current meds w/o ADE. Doing well, functional with current meds.  She needs to get back with her exercise routine. She has had more thigh pain B, improved with movement.  Chronic pain near the L SI joint and with sciatica pain on the L side.   Anxiety controlled with current meds.  No ADE.   IBS.  Controlled with current meds.  No ADE.   Hypothyroid after prev thyroidectomy.  TSH wnl. No dysphagia or neck mass per patient.   PMH and SH reviewed  Meds, vitals, and allergies reviewed.   ROS: See HPI.  Otherwise negative.    GEN: nad, alert and oriented HEENT: mucous membranes moist NECK: supple w/o LA CV: rrr. PULM: ctab, no inc wob ABD: soft, +bs EXT: no edema SKIN: no acute rash Back ttp near L SI joint and walks with limp in clinic but no weakness in the BLE.

## 2013-08-15 NOTE — Assessment & Plan Note (Signed)
Controlled with current meds. Continue as is. 

## 2013-08-15 NOTE — Assessment & Plan Note (Signed)
Wouldn't start statin at this point.  Labs d/w pt.  She agrees.

## 2013-08-15 NOTE — Assessment & Plan Note (Signed)
Continue PPI ?

## 2013-08-15 NOTE — Assessment & Plan Note (Signed)
Routine anticipatory guidance given to patient.  See health maintenance. Her sister and sister in law died in the last year.  Living will d/w pt.  She is the last of her family.  Her son is out of state.  She has a sister in law that she could talk to.  I encouraged her to talk to her family.   Tetanus 2013  Shingles shot 2011 Flu shot done.  PNA shot prev done.  Colonoscopy not indicated.  Pap not indicated.  She is due for a mammogram and she'll call about that.

## 2013-08-15 NOTE — Assessment & Plan Note (Signed)
tsh wnl, continue as is.   

## 2013-08-15 NOTE — Assessment & Plan Note (Signed)
Chronic back pain. H/o hip fracture. Takes current meds w/o ADE. Doing well, functional with current meds. She needs to get back with her exercise routine. She has had more thigh pain B, improved with movement. Chronic pain near the L SI joint and with sciatica pain on the L side.  Would continue current meds.

## 2013-08-21 ENCOUNTER — Encounter: Payer: Self-pay | Admitting: Family Medicine

## 2013-09-16 ENCOUNTER — Other Ambulatory Visit: Payer: Self-pay | Admitting: Family Medicine

## 2013-09-16 NOTE — Telephone Encounter (Signed)
Received refill request electronically. Last office visit 08/14/13.

## 2013-09-16 NOTE — Telephone Encounter (Signed)
Patient advised.  Rx left at front desk for pick up. 

## 2013-09-16 NOTE — Telephone Encounter (Signed)
Printed

## 2013-10-15 ENCOUNTER — Other Ambulatory Visit: Payer: Self-pay | Admitting: Family Medicine

## 2013-10-15 NOTE — Telephone Encounter (Signed)
Received refill request electronically. Last office visit 08/14/13. Is it okay to refill medication?

## 2013-10-15 NOTE — Telephone Encounter (Signed)
Printed.  Thanks.  

## 2013-10-16 NOTE — Telephone Encounter (Signed)
Patient advised.  Rx left at front desk for pick up. 

## 2013-11-11 ENCOUNTER — Encounter: Payer: Self-pay | Admitting: Family Medicine

## 2013-11-11 ENCOUNTER — Ambulatory Visit (INDEPENDENT_AMBULATORY_CARE_PROVIDER_SITE_OTHER): Payer: Medicare Other | Admitting: Family Medicine

## 2013-11-11 VITALS — BP 130/78 | HR 86 | Temp 97.9°F | Wt 132.5 lb

## 2013-11-11 DIAGNOSIS — R195 Other fecal abnormalities: Secondary | ICD-10-CM

## 2013-11-11 DIAGNOSIS — B029 Zoster without complications: Secondary | ICD-10-CM

## 2013-11-11 DIAGNOSIS — F411 Generalized anxiety disorder: Secondary | ICD-10-CM

## 2013-11-11 MED ORDER — VALACYCLOVIR HCL 1 G PO TABS: 1000.0000 mg | ORAL_TABLET | Freq: Three times a day (TID) | ORAL | Status: DC

## 2013-11-11 NOTE — Progress Notes (Signed)
Pre-visit discussion using our clinic review tool. No additional management support is needed unless otherwise documented below in the visit note.  [employer] decided not to cover her xanax.  I asked her to check on paying cash for this, instead of changing meds.  She'll check on this.    GI MD is retiring in 2014.  She has noted gradual dec in stool size, no blood in stool. Asking for advice, referral. No abd pain.  She has trouble getting to [place].    L facial rash, red.  Quick onset. Stings and burns. Present for about 3 days.  Had the shingles shot.  She is better than she was a few days ago.    Meds, vitals, and allergies reviewed.   ROS: See HPI.  Otherwise, noncontributory.  nad ncat except for faint red rash on L V2 and V3 distribution, abnormal sensation in the L V2 and V3 No weakness on facial muscle testing Neck supple, no LA rrr ctab abd soft, not ttp

## 2013-11-11 NOTE — Patient Instructions (Signed)
This looks like shingles. Start the valtrex today.  Take care.

## 2013-11-12 DIAGNOSIS — R195 Other fecal abnormalities: Secondary | ICD-10-CM | POA: Insufficient documentation

## 2013-11-12 DIAGNOSIS — B029 Zoster without complications: Secondary | ICD-10-CM | POA: Insufficient documentation

## 2013-11-12 NOTE — Assessment & Plan Note (Signed)
[employer] decided not to cover her xanax. I asked her to check on paying cash for this, instead of changing meds. She'll check on this.

## 2013-11-12 NOTE — Assessment & Plan Note (Signed)
Refer to GI 

## 2013-11-12 NOTE — Assessment & Plan Note (Signed)
Likely, mild case.  Start valtrex.  Would expect this to resolve.  D/w pt.

## 2013-11-17 ENCOUNTER — Other Ambulatory Visit: Payer: Self-pay | Admitting: Family Medicine

## 2013-11-17 NOTE — Telephone Encounter (Signed)
Received refill request electronically. Last refill 08/14/13 #30/2 refills. Last office visit 11/11/13. Is it okay to refill medication?

## 2013-11-17 NOTE — Telephone Encounter (Signed)
Please call in

## 2013-11-18 NOTE — Telephone Encounter (Signed)
Medication phoned to pharmacy.  

## 2013-12-08 ENCOUNTER — Other Ambulatory Visit: Payer: Self-pay | Admitting: Family Medicine

## 2013-12-15 ENCOUNTER — Ambulatory Visit (INDEPENDENT_AMBULATORY_CARE_PROVIDER_SITE_OTHER): Payer: Medicare Other | Admitting: Family Medicine

## 2013-12-15 ENCOUNTER — Encounter: Payer: Self-pay | Admitting: Family Medicine

## 2013-12-15 VITALS — BP 126/84 | HR 96 | Temp 98.2°F | Wt 132.5 lb

## 2013-12-15 DIAGNOSIS — J020 Streptococcal pharyngitis: Secondary | ICD-10-CM

## 2013-12-15 LAB — POCT RAPID STREP A (OFFICE): Rapid Strep A Screen: POSITIVE — AB

## 2013-12-15 MED ORDER — AZITHROMYCIN 250 MG PO TABS: ORAL_TABLET | ORAL | Status: DC

## 2013-12-15 NOTE — Progress Notes (Signed)
Pre-visit discussion using our clinic review tool. No additional management support is needed unless otherwise documented below in the visit note.  Shingles rash resolved.  No facial pain now.    She had GI f/u but no intervention for stool changes.  I asked her to call GI back about that.    ST, chills.  Feels weak.  Fatigued.  Rhinorrhea.  Minimal cough.  Trying home remedies, ie salt water gargles, w/o much relief.    Meds, vitals, and allergies reviewed.   ROS: See HPI.  Otherwise, noncontributory.  GEN: nad, alert and oriented HEENT: mucous membranes moist, tm w/o erythema, nasal exam w/o erythema, clear discharge noted,  OP with cobblestoning NECK: supple w/o LA, UAN noted CV: rrr.   PULM: ctab, no inc wob EXT: no edema SKIN: no acute rash  RST pos

## 2013-12-15 NOTE — Assessment & Plan Note (Signed)
Nontoxic, zmax, f/u prn.  Supportive tx o/w.  She agrees.

## 2013-12-15 NOTE — Patient Instructions (Addendum)
If you don't have improvement in your bowel movements, then call about getting to see Dr. Vira Agar.  If they can't see you, then let me know.   Keep gargling with salt water and start the antibiotics today.  Take care.

## 2013-12-22 ENCOUNTER — Ambulatory Visit (INDEPENDENT_AMBULATORY_CARE_PROVIDER_SITE_OTHER)
Admission: RE | Admit: 2013-12-22 | Discharge: 2013-12-22 | Disposition: A | Discharge status: Home / Self Care | Payer: Medicare Other | Source: Ambulatory Visit | Attending: Internal Medicine | Admitting: Internal Medicine

## 2013-12-22 ENCOUNTER — Telehealth: Payer: Self-pay

## 2013-12-22 ENCOUNTER — Encounter: Payer: Self-pay | Admitting: Internal Medicine

## 2013-12-22 ENCOUNTER — Ambulatory Visit (INDEPENDENT_AMBULATORY_CARE_PROVIDER_SITE_OTHER): Payer: Medicare Other | Admitting: Internal Medicine

## 2013-12-22 VITALS — BP 110/82 | HR 106 | Temp 98.2°F | Wt 128.8 lb

## 2013-12-22 DIAGNOSIS — R05 Cough: Secondary | ICD-10-CM

## 2013-12-22 DIAGNOSIS — J209 Acute bronchitis, unspecified: Secondary | ICD-10-CM

## 2013-12-22 DIAGNOSIS — R059 Cough, unspecified: Secondary | ICD-10-CM

## 2013-12-22 MED ORDER — LEVOFLOXACIN 500 MG PO TABS: 500.0000 mg | ORAL_TABLET | Freq: Every day | ORAL | Status: DC

## 2013-12-22 NOTE — Telephone Encounter (Signed)
Pt was seen 12/15/13 and finished antibiotic on 12/19/13; pt still has S/T, head and chest congestion with rattling and wheezing in chest, some SOB and pt feels weak; pt scheduled appt today with Webb Silversmith NP at 12:15 pm. If pt worsens or changes prior to appt pt will cb.

## 2013-12-22 NOTE — Progress Notes (Signed)
Pre-visit discussion using our clinic review tool. No additional management support is needed unless otherwise documented below in the visit note.  

## 2013-12-22 NOTE — Progress Notes (Signed)
HPI  Pt presents to the clinic today with c/o sore throat, head and chest congestion and wheezing. This started 2 weeks ago. She was seen last week for the same. She did have a positive rapid strep. She was given azithromax. She did have some improvement with the medication but her symptoms have returned. She also c/o worsening fatigue and weakness. She has been having chills but has not taken her temperature. She denies history of breathing problems.  Review of Systems      Past Medical History  Diagnosis Date  . Personal history of colonic polyps     colonoscpy, removed 1995  . Thyroid carcinoma 01/1994  . Hyperlipemia   . Hypertension   . Hypothyroidism   . Hiatal hernia   . Barrett esophagus   . UTI (urinary tract infection)     Chronic  . Hiatal hernia     EGD 1998, 2007 ( Dr. Sharlett Iles)  . Diplopia     history of,hospital 2/26-2/27/10  . Diverticulitis     colonoscopy 1995.  CT 01/31/09, 02/02/09    Family History  Problem Relation Age of Onset  . Cancer Brother     Pharyngeal   . Heart disease Brother     MI  . Diabetes Brother   . Diabetes Sister     amputation  . Hypertension Mother   . Stroke Father     History   Social History  . Marital Status: Widowed    Spouse Name: N/A    Number of Children: 0  . Years of Education: N/A   Occupational History  . 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 Smoking status: Never Smoker   . Smokeless tobacco: Never Used  . Alcohol Use: No  . Drug Use: No  . Sexual Activity: Not on file   Other Topics Concern  . Not on file   Social History Narrative   Widowed.  Final sib died in 15-Mar-2013   1 step son    Allergies  Allergen Reactions  . Ciprofloxacin     Can cause GI upset, esp is taken with metamucil, o/w it is tolerated  . Cortisone Acetate     REACTION: nausea  . Gabapentin     REACTION: severe dizziness  . Metoclopramide Hcl   . Penicillins     REACTION: pass out  .  Promethazine Hcl     REACTION: unspecified     Constitutional: Positive headache, fatigue. Denies fever or abrupt weight changes.  HEENT:  Positive sore throat. Denies eye redness, eye pain, pressure behind the eyes, facial pain, nasal congestion, ear pain, ringing in the ears, wax buildup, runny nose or bloody nose. Respiratory: Positive cough and wheezing. Denies difficulty breathing or shortness of breath.  Cardiovascular: Denies chest pain, chest tightness, palpitations or swelling in the hands or feet.   No other specific complaints in a complete review of systems (except as listed in HPI above).  Objective:   BP 110/82  Pulse 106  Temp(Src) 98.2 F (36.8 C) (Oral)  Wt 128 lb 12 oz (58.401 kg)  SpO2 95%  LMP 03/28/1989 Wt Readings from Last 3 Encounters:  12/22/13 128 lb 12 oz (58.401 kg)  12/15/13 132 lb 8 oz (60.102 kg)  11/11/13 132 lb 8 oz (60.102 kg)     General: Appears her stated age, slightly ill appearing but in NAD. HEENT: Head: normal shape and size; Eyes: sclera white, no  icterus, conjunctiva pink, PERRLA and EOMs intact; Ears: Tm's gray and intact, normal light reflex; Nose: mucosa pink and moist, septum midline; Throat/Mouth: + PND. Teeth present, mucosa erythematous and moist, no exudate noted, no lesions or ulcerations noted.  Neck: Mild cervical lymphadenopathy. Neck supple, trachea midline. No massses, lumps or thyromegaly present.  Cardiovascular: Normal rate and rhythm. S1,S2 noted.  No murmur, rubs or gallops noted. No JVD or BLE edema. No carotid bruits noted. Pulmonary/Chest: Normal effort and bilateral expiratory wheezing noted. No respiratory distress. No rales or ronchi noted.      Assessment & Plan:   Acute Bronchitis:  Get some rest and drink plenty of water Do salt water gargles for the sore throat Given worsening symptoms will check chest xray to r/o pneumonia eRx for Levaquin 500 mg daily x 7 days  RTC as needed or if symptoms  persist.

## 2013-12-22 NOTE — Patient Instructions (Signed)
Acute Bronchitis Bronchitis is inflammation of the airways that extend from the windpipe into the lungs (bronchi). The inflammation often causes mucus to develop. This leads to a cough, which is the most common symptom of bronchitis.  In acute bronchitis, the condition usually develops suddenly and goes away over time, usually in a couple weeks. Smoking, allergies, and asthma can make bronchitis worse. Repeated episodes of bronchitis may cause further lung problems.  CAUSES Acute bronchitis is most often caused by the same virus that causes a cold. The virus can spread from person to person (contagious).  SIGNS AND SYMPTOMS   Cough.   Fever.   Coughing up mucus.   Body aches.   Chest congestion.   Chills.   Shortness of breath.   Sore throat.  DIAGNOSIS  Acute bronchitis is usually diagnosed through a physical exam. Tests, such as chest X-rays, are sometimes done to rule out other conditions.  TREATMENT  Acute bronchitis usually goes away in a couple weeks. Often times, no medical treatment is necessary. Medicines are sometimes given for relief of fever or cough. Antibiotics are usually not needed but may be prescribed in certain situations. In some cases, an inhaler may be recommended to help reduce shortness of breath and control the cough. A cool mist vaporizer may also be used to help thin bronchial secretions and make it easier to clear the chest.  HOME CARE INSTRUCTIONS  Get plenty of rest.   Drink enough fluids to keep your urine clear or pale yellow (unless you have a medical condition that requires fluid restriction). Increasing fluids may help thin your secretions and will prevent dehydration.   Only take over-the-counter or prescription medicines as directed by your health care provider.   Avoid smoking and secondhand smoke. Exposure to cigarette smoke or irritating chemicals will make bronchitis worse. If you are a smoker, consider using nicotine gum or skin  patches to help control withdrawal symptoms. Quitting smoking will help your lungs heal faster.   Reduce the chances of another bout of acute bronchitis by washing your hands frequently, avoiding people with cold symptoms, and trying not to touch your hands to your mouth, nose, or eyes.   Follow up with your health care provider as directed.  SEEK MEDICAL CARE IF: Your symptoms do not improve after 1 week of treatment.  SEEK IMMEDIATE MEDICAL CARE IF:  You develop an increased fever or chills.   You have chest pain.   You have severe shortness of breath.  You have bloody sputum.   You develop dehydration.  You develop fainting.  You develop repeated vomiting.  You develop a severe headache. MAKE SURE YOU:   Understand these instructions.  Will watch your condition.  Will get help right away if you are not doing well or get worse. Document Released: 12/21/2004 Document Revised: 07/16/2013 Document Reviewed: 05/06/2013 Exit[employer] Patient Information 2014 [employer].  

## 2014-01-06 ENCOUNTER — Other Ambulatory Visit: Payer: Self-pay | Admitting: Family Medicine

## 2014-01-07 NOTE — Telephone Encounter (Signed)
Printed

## 2014-01-07 NOTE — Telephone Encounter (Signed)
Written prescription handed to patient.

## 2014-02-05 ENCOUNTER — Other Ambulatory Visit: Payer: Self-pay | Admitting: Family Medicine

## 2014-02-05 NOTE — Telephone Encounter (Signed)
Patient advised.  Rx left at front desk for pick up. 

## 2014-02-05 NOTE — Telephone Encounter (Signed)
Printed.  Thanks.  

## 2014-02-05 NOTE — Telephone Encounter (Signed)
Pt left v/m requesting rx hydrocodone apap. Call when ready for pick up.  

## 2014-03-05 ENCOUNTER — Other Ambulatory Visit: Payer: Self-pay | Admitting: Family Medicine

## 2014-03-06 NOTE — Telephone Encounter (Signed)
Electronic refill request. Last filled Alprazolam 30 tablets 2RF 11/17/2013.  Last filled Hydrocodone:  60 tablet 0RF  02/05/2014

## 2014-03-08 NOTE — Telephone Encounter (Signed)
Both printed

## 2014-03-09 NOTE — Telephone Encounter (Signed)
Patient advised.  Rx left at front desk for pick up. 

## 2014-04-06 ENCOUNTER — Other Ambulatory Visit: Payer: Self-pay | Admitting: Family Medicine

## 2014-04-06 NOTE — Telephone Encounter (Signed)
Patient notified by telephone that script is up front and ready for pickup.

## 2014-04-06 NOTE — Telephone Encounter (Signed)
Received refill request electronically from pharmacy. Last refill 03/08/14 #60, last office visit 12/22/13/acute. Is it okay to refill medication?

## 2014-04-06 NOTE — Telephone Encounter (Signed)
Printed.  Thanks.  

## 2014-05-04 ENCOUNTER — Other Ambulatory Visit: Payer: Self-pay | Admitting: Family Medicine

## 2014-05-04 NOTE — Telephone Encounter (Signed)
Electronic refill request. Last Filled:   60 tablet 0 RF on   04/06/2014.  Please advise.

## 2014-05-05 NOTE — Telephone Encounter (Signed)
Patient advised.  Rx left at front desk for pick up. 

## 2014-05-05 NOTE — Telephone Encounter (Signed)
Printed.  Thanks.  

## 2014-06-02 ENCOUNTER — Other Ambulatory Visit: Payer: Self-pay | Admitting: Family Medicine

## 2014-06-02 NOTE — Telephone Encounter (Signed)
Electronic refill request. Last Filled:   30 tablet 2 RF on   03/08/2014.  Please advise.  Marland Kitchen

## 2014-06-02 NOTE — Telephone Encounter (Signed)
Schedule medicare physical.  Please call in.  Thanks.

## 2014-06-03 NOTE — Telephone Encounter (Signed)
Rx called to pharmacy. Tried to contact patient by telephone and line busy X 2. Will try again later.

## 2014-06-03 NOTE — Telephone Encounter (Signed)
Patient advised and physical scheduled. Patient stated that she needs a refill on her Hydrocodone. Please call when ready for pickup.

## 2014-06-04 MED ORDER — HYDROCODONE-ACETAMINOPHEN 5-325 MG PO TABS: ORAL_TABLET | ORAL | Status: DC

## 2014-06-04 NOTE — Telephone Encounter (Signed)
Patient advised.  Rx left at front desk for pick up. 

## 2014-06-04 NOTE — Telephone Encounter (Signed)
Printed.  Thanks.  

## 2014-07-06 ENCOUNTER — Other Ambulatory Visit: Payer: Self-pay | Admitting: Family Medicine

## 2014-07-06 NOTE — Telephone Encounter (Signed)
Electronic refill request. Last Filled:   60 tablet 0  RF on  06/04/2014.  Please advise.

## 2014-07-07 NOTE — Telephone Encounter (Signed)
Printed.  Thanks.  

## 2014-07-07 NOTE — Telephone Encounter (Signed)
Pt called to ck on status of Hydrocodone refill; pt has one pill left. [place] CMA said pt can come to office and rx will be at front desk for pick up.

## 2014-07-22 LAB — HM DIABETES EYE EXAM

## 2014-08-04 ENCOUNTER — Other Ambulatory Visit: Payer: Self-pay | Admitting: Family Medicine

## 2014-08-04 NOTE — Telephone Encounter (Signed)
Please call in.  Thanks.   

## 2014-08-04 NOTE — Telephone Encounter (Signed)
Received refill request electronically from pharmacy. Last refill 08/14/13 # 60, 5 refills, last office visit 12/22/13/acute. Is it okay to refill medication?

## 2014-08-04 NOTE — Telephone Encounter (Signed)
Phoned in to pharmacy. 

## 2014-08-06 ENCOUNTER — Other Ambulatory Visit: Payer: Self-pay | Admitting: Family Medicine

## 2014-08-06 ENCOUNTER — Other Ambulatory Visit: Payer: Self-pay

## 2014-08-06 NOTE — Telephone Encounter (Signed)
Pt left v/m requesting rx hydrocodone apap. Call when ready for pick up.  

## 2014-08-06 NOTE — Telephone Encounter (Signed)
Electronic refill request last refilled on 07/07/14, please advise

## 2014-08-07 ENCOUNTER — Encounter: Payer: Self-pay | Admitting: Family Medicine

## 2014-08-07 MED ORDER — HYDROCODONE-ACETAMINOPHEN 5-325 MG PO TABS: ORAL_TABLET | ORAL | Status: DC

## 2014-08-07 NOTE — Telephone Encounter (Signed)
Printed.  Thanks.  

## 2014-08-07 NOTE — Telephone Encounter (Signed)
Prev printed. Thanks.

## 2014-08-07 NOTE — Telephone Encounter (Signed)
Informed pt that RX ready to be picked up.

## 2014-08-14 ENCOUNTER — Other Ambulatory Visit: Payer: Self-pay | Admitting: Family Medicine

## 2014-08-14 ENCOUNTER — Other Ambulatory Visit (INDEPENDENT_AMBULATORY_CARE_PROVIDER_SITE_OTHER): Payer: Medicare Other

## 2014-08-14 DIAGNOSIS — I10 Essential (primary) hypertension: Secondary | ICD-10-CM

## 2014-08-14 LAB — COMPREHENSIVE METABOLIC PANEL
ALT: 11 U/L (ref 0–35)
AST: 26 U/L (ref 0–37)
Albumin: 3.9 g/dL (ref 3.5–5.2)
Alkaline Phosphatase: 83 U/L (ref 39–117)
BUN: 13 mg/dL (ref 6–23)
CO2: 30 mEq/L (ref 19–32)
Calcium: 8.8 mg/dL (ref 8.4–10.5)
Chloride: 103 mEq/L (ref 96–112)
Creatinine, Ser: 1.1 mg/dL (ref 0.4–1.2)
GFR: 51.62 mL/min — ABNORMAL LOW (ref >60.00)
Glucose, Bld: 99 mg/dL (ref 70–99)
Potassium: 4 mEq/L (ref 3.5–5.1)
Sodium: 140 mEq/L (ref 135–145)
Total Bilirubin: 0.5 mg/dL (ref 0.2–1.2)
Total Protein: 7.7 g/dL (ref 6.0–8.3)

## 2014-08-14 LAB — LIPID PANEL
Cholesterol: 246 mg/dL — ABNORMAL HIGH (ref 0–200)
HDL: 43.6 mg/dL (ref >39.00)
LDL Cholesterol: 168 mg/dL — ABNORMAL HIGH (ref 0–99)
NonHDL: 202.4
Total CHOL/HDL Ratio: 6
Triglycerides: 171 mg/dL — ABNORMAL HIGH (ref 0.0–149.0)
VLDL: 34.2 mg/dL (ref 0.0–40.0)

## 2014-08-14 LAB — TSH: TSH: 2.13 u[IU]/mL (ref 0.35–4.50)

## 2014-08-21 ENCOUNTER — Encounter: Payer: Medicare Other | Admitting: Family Medicine

## 2014-08-24 ENCOUNTER — Encounter: Payer: Self-pay | Admitting: Family Medicine

## 2014-08-24 ENCOUNTER — Ambulatory Visit (INDEPENDENT_AMBULATORY_CARE_PROVIDER_SITE_OTHER): Payer: Medicare Other | Admitting: Family Medicine

## 2014-08-24 VITALS — BP 128/78 | HR 88 | Temp 97.4°F | Ht 62.75 in | Wt 126.0 lb

## 2014-08-24 DIAGNOSIS — I10 Essential (primary) hypertension: Secondary | ICD-10-CM

## 2014-08-24 DIAGNOSIS — F411 Generalized anxiety disorder: Secondary | ICD-10-CM

## 2014-08-24 DIAGNOSIS — Z0000 Encounter for general adult medical examination without abnormal findings: Secondary | ICD-10-CM

## 2014-08-24 DIAGNOSIS — K589 Irritable bowel syndrome without diarrhea: Secondary | ICD-10-CM

## 2014-08-24 DIAGNOSIS — K2270 Barrett's esophagus without dysplasia: Secondary | ICD-10-CM

## 2014-08-24 DIAGNOSIS — M549 Dorsalgia, unspecified: Secondary | ICD-10-CM

## 2014-08-24 DIAGNOSIS — Z23 Encounter for immunization: Secondary | ICD-10-CM

## 2014-08-24 DIAGNOSIS — Z7189 Other specified counseling: Secondary | ICD-10-CM

## 2014-08-24 MED ORDER — ALPRAZOLAM 0.25 MG PO TABS: ORAL_TABLET | ORAL | Status: DC

## 2014-08-24 MED ORDER — LEVOTHYROXINE SODIUM 88 MCG PO TABS: ORAL_TABLET | ORAL | Status: DC

## 2014-08-24 MED ORDER — LISINOPRIL 10 MG PO TABS: 10.0000 mg | ORAL_TABLET | Freq: Every evening | ORAL | Status: DC

## 2014-08-24 MED ORDER — HYDROCODONE-ACETAMINOPHEN 5-325 MG PO TABS: ORAL_TABLET | ORAL | Status: DC

## 2014-08-24 MED ORDER — MIRTAZAPINE 15 MG PO TABS: ORAL_TABLET | ORAL | Status: DC

## 2014-08-24 MED ORDER — OMEPRAZOLE 40 MG PO CPDR: DELAYED_RELEASE_CAPSULE | ORAL | Status: DC

## 2014-08-24 NOTE — Progress Notes (Signed)
Pre visit review using our clinic review tool, if applicable. No additional management support is needed unless otherwise documented below in the visit note.  I have personally reviewed the Medicare Annual Wellness questionnaire and have noted 1. The patient's medical and social history 2. Their use of alcohol, tobacco or illicit drugs 3. Their current medications and supplements 4. The patient's functional ability including ADL's, fall risks, home safety risks and hearing or visual             impairment. 5. Diet and physical activities 6. Evidence for depression or mood disorders  The patients weight, height, BMI have been recorded in the chart and visual acuity is per eye clinic.  I have made referrals, counseling and provided education to the patient based review of the above and I have provided the pt with a written personalized care plan for preventive services.  Provider list updated- see scanned forms.  Routine anticipatory guidance given to patient.  See health maintenance.  Flu 2015 Shingles 2011 PNA prev done, she'll get prevnar later as flu shot done today per patient preference Tetanus 2013 Colonoscopy not due given age >60.  Breast cancer screening- patient to call.  Advance directive- sister in law Carrington Clamp designated if patient were incapacitated.   Cognitive function addressed- see scanned forms- and if abnormal then additional documentation follows.   IBS.  Insurance wouldn't cover librax.  Had done well on it.  Asking about options.  I told her I'd check with pharmacy and notify her.   Hypothyroid.  Compliant with replacement.  No ADE.  TSH wnl.  No neck mass.    Hypertension:    Using medication without problems or lightheadedness: yes Chest pain with exertion:no Edema:no Short of breath:no Labs d/w pt.  She declined statin for lipids, she wants to work more on diet.  This is reasonable.   H/o Barretts on PPI, doing well.  No ADE.  No GERD sx.   Chronic back  pain. H/o hip fracture. Takes current meds w/o ADE. Doing well, functional with current meds. She needs to get back with her exercise routine. She has had more thigh pain B, improved with movement. Chronic pain near the L SI joint and with sciatica pain on the L side.   PMH and SH reviewed  Meds, vitals, and allergies reviewed.   ROS: See HPI.  Otherwise negative.    GEN: nad, alert and oriented HEENT: mucous membranes moist NECK: supple w/o LA, no tmg CV: rrr. PULM: ctab, no inc wob ABD: soft, +bs EXT: no edema SKIN: no acute rash

## 2014-08-24 NOTE — Patient Instructions (Signed)
Call [facility] about a mammogram. [phone] Take care.  Glad to see you.  I'll check on the medicine changes in the meantime at the pharmacy.

## 2014-08-25 DIAGNOSIS — Z0000 Encounter for general adult medical examination without abnormal findings: Secondary | ICD-10-CM | POA: Insufficient documentation

## 2014-08-25 DIAGNOSIS — Z7189 Other specified counseling: Secondary | ICD-10-CM | POA: Insufficient documentation

## 2014-08-25 NOTE — Assessment & Plan Note (Signed)
Labs d/w pt. She declined statin for lipids, she wants to work more on diet. This is reasonable.  Continue current meds for BP.

## 2014-08-25 NOTE — Assessment & Plan Note (Signed)
Chronic back pain. H/o hip fracture. Takes current meds w/o ADE. Doing well, functional with current meds. She needs to get back with her exercise routine. She has had more thigh pain B, improved with movement. Chronic pain near the L SI joint and with sciatica pain on the L side.  Continue as is.

## 2014-08-25 NOTE — Assessment & Plan Note (Signed)
H/o Barretts on PPI, doing well. No ADE. No GERD sx.

## 2014-08-25 NOTE — Assessment & Plan Note (Signed)
D/w pt.  We may be able to consolidate plain librium and the xanax- doing well o/w.  I'll check with pharmacy.

## 2014-08-25 NOTE — Assessment & Plan Note (Signed)
Insurance wouldn't cover librax, she got priced out of it. Had done well on it. Asking about options. I told her I'd check with pharmacy and notify her.

## 2014-08-25 NOTE — Assessment & Plan Note (Signed)
Flu 2015  Shingles 2011  PNA prev done, she'll get prevnar later as flu shot done today per patient preference  Tetanus 2013  Colonoscopy not due given age >63.  Breast cancer screening- patient to call.  Advance directive- sister in law Carrington Clamp designated if patient were incapacitated.  Cognitive function addressed- see scanned forms- and if abnormal then additional documentation follows.

## 2014-08-26 ENCOUNTER — Telehealth: Payer: Self-pay | Admitting: Family Medicine

## 2014-08-26 ENCOUNTER — Encounter: Payer: Self-pay | Admitting: Family Medicine

## 2014-08-26 DIAGNOSIS — E2839 Other primary ovarian failure: Secondary | ICD-10-CM

## 2014-08-26 MED ORDER — DICYCLOMINE HCL 10 MG PO CAPS: 10.0000 mg | ORAL_CAPSULE | Freq: Three times a day (TID) | ORAL | Status: DC

## 2014-08-26 NOTE — Telephone Encounter (Signed)
Please call pt.  I talked with Rob at [facility].  The best option is likely to change to bentyl 10mg , instead of the librax.  I sent the med to the pharmacy, notify me if that doesn't help.    The other issue is the DXA scan.  It would be reasonable to get that set up, assuming she would want treatment if her testing showed osteoporosis.  If she doesn't even want to consider treatment, then I wouldn't go for the scan.  Let me know if she wants me to order it.  Thanks.

## 2014-08-26 NOTE — Telephone Encounter (Signed)
Patient notified as instructed by telephone. Was advised by patient that she would like to go ahead and have the DXA scan done, but if treatment is too expensive than she may decide not to have treatment. Patient stated that it all depends on how much it will cost her out of pocket. Advised patient that the referral coordinator will be in touch to get this scheduled.

## 2014-08-26 NOTE — Telephone Encounter (Signed)
Ordered. Thanks

## 2014-10-07 ENCOUNTER — Other Ambulatory Visit: Payer: Self-pay

## 2014-10-07 MED ORDER — HYDROCODONE-ACETAMINOPHEN 5-325 MG PO TABS: ORAL_TABLET | ORAL | Status: DC

## 2014-10-07 NOTE — Telephone Encounter (Signed)
Patient notified by telephone that script is up front ready for pickup. 

## 2014-10-07 NOTE — Telephone Encounter (Signed)
Pt left v/m; pt contacted [facility] 10/05/14 about hydrocodone refill; pt request refill today.Please advise.call pt when ready for pick up.

## 2014-10-07 NOTE — Telephone Encounter (Signed)
Printed.  Thanks.  

## 2014-10-28 ENCOUNTER — Ambulatory Visit (INDEPENDENT_AMBULATORY_CARE_PROVIDER_SITE_OTHER): Payer: Medicare Other | Admitting: Family Medicine

## 2014-10-28 ENCOUNTER — Encounter: Payer: Self-pay | Admitting: Family Medicine

## 2014-10-28 VITALS — BP 120/80 | HR 89 | Temp 97.6°F | Ht 62.75 in | Wt 124.5 lb

## 2014-10-28 DIAGNOSIS — H6122 Impacted cerumen, left ear: Secondary | ICD-10-CM

## 2014-10-28 DIAGNOSIS — H918X2 Other specified hearing loss, left ear: Secondary | ICD-10-CM

## 2014-10-28 NOTE — Progress Notes (Signed)
Subjective:    Patient ID: [name], female    DOB: [birth year], 80 y.o.   MRN: [id number]  HPI L ear is stopped up  That started about a week ago - feels full and sore at times  Is troublesome-that is her "good ear"   She has allergies/ takes store brand of claritin  Head does feel congested   No fever   Patient Active Problem List   Diagnosis Date Noted  . Hearing loss of left ear due to cerumen impaction 10/28/2014  . Medicare annual wellness visit, initial 08/25/2014  . Advance care planning 08/25/2014  . Anxiety state, unspecified 10/23/2012  . IBS (irritable bowel syndrome) 04/18/2011  . GERD (gastroesophageal reflux disease) 04/18/2011  . Barrett esophagus 04/18/2011  . MULTIPLE CRANIAL NERVE PALSIES 01/29/2009  . CONVERGENCE INSUFF/PALSY BINOCULAR EYE MOVEMENT 01/22/2009  . KERATOACANTHOMA 11/18/2007  . BACK PAIN, CHRONIC 05/15/2007  . HYPOTHYROIDISM 02/15/2007  . HYPERLIPIDEMIA 02/15/2007  . HYPERTENSION 02/15/2007  . MITRAL VALVE PROLAPSE 02/15/2007  . ATRIAL FIBRILLATION, PAROXYSMAL 02/15/2007  . DIVERTICULOSIS, COLON 02/15/2007  . POSTMENOPAUSAL STATUS 02/15/2007  . COLONIC POLYPS, HX OF 02/15/2007  . DISORDER, DEPRESSIVE NEC 05/27/1998  . THYROID CANCER, HX OF 02/25/1994   Past Medical History  Diagnosis Date  . Personal history of colonic polyps     colonoscpy, removed 1995  . Thyroid carcinoma 01/1994  . Hyperlipemia   . Hypertension   . Hypothyroidism   . Hiatal hernia   . Barrett esophagus   . UTI (urinary tract infection)     Chronic  . Hiatal hernia     EGD 1998, 2007 ( Dr. Sharlett Iles)  . Diplopia     history of,hospital 2/26-2/27/10  . Diverticulitis     colonoscopy 1995.  CT 01/31/09, 02/02/09   Past Surgical History  Procedure Laterality Date  . Thyroidectomy  04/1994    thyroid cancer  . Cholecystectomy    . Hip surgery  08/1993    due to fracture of right hip  . Gyn surgery      hysterectomy   History  Substance Use  Topics  . Smoking status: Never Smoker   . Smokeless tobacco: Never Used  . Alcohol Use: No   Family History  Problem Relation Age of Onset  . Cancer Brother     Pharyngeal   . Heart disease Brother     MI  . Diabetes Brother   . Diabetes Sister     amputation  . Hypertension Mother   . Stroke Father    Allergies  Allergen Reactions  . Ciprofloxacin     Can cause GI upset, esp is taken with metamucil, o/w it is tolerated  . Cortisone Acetate     REACTION: nausea  . Gabapentin     REACTION: severe dizziness  . Metoclopramide Hcl   . Penicillins     REACTION: pass out  . Promethazine Hcl     REACTION: unspecified   Current Outpatient Prescriptions on File Prior to Visit  Medication Sig Dispense Refill  . ALPRAZolam (XANAX) 0.25 MG tablet TAKE ONE TABLET BY MOUTH TWICE DAILY AS NEEDED FOR ANXIETY 30 tablet 2  . dicyclomine (BENTYL) 10 MG capsule Take 1 capsule (10 mg total) by mouth 4 (four) times daily -  before meals and at bedtime. 120 capsule 12  . HYDROcodone-acetaminophen (NORCO/VICODIN) 5-325 MG per tablet TAKE 1/2 - 1 TABLET BY MOUTH EVERY 6 HOURS AS NEEDED FOR PAIN 60 tablet 0  . levothyroxine (  SYNTHROID, LEVOTHROID) 88 MCG tablet TAKE ONE (1) TABLET BY MOUTH EVERY DAY 30 tablet 12  . lisinopril (PRINIVIL,ZESTRIL) 10 MG tablet Take 1 tablet (10 mg total) by mouth Nightly. 30 tablet 12  . loratadine ([facility] ALLERGY RELIEF) 10 MG dissolvable tablet Take 10 mg by mouth daily as needed for allergies.     . mirtazapine (REMERON) 15 MG tablet TAKE 1 TABLET BY MOUTH EVERY NIGHT AT BEDTIME 30 tablet 12  . Multiple Vitamin (MULTIVITAMIN PO) Take by mouth daily.      Marland Kitchen omeprazole (PRILOSEC) 40 MG capsule One tab by mouth 61min before breakfast 30 capsule 12  . polyethylene glycol powder (MIRALAX) powder Take 17 g by mouth every evening.      No current facility-administered medications on file prior to visit.     Review of Systems Review of Systems  Constitutional: Negative  for fever, appetite change, fatigue and unexpected weight change.  Eyes: Negative for pain and visual disturbance.  ENT pos for fullness and dec hearing L side/ neg for ST or sinus pain  Respiratory: Negative for cough and shortness of breath.   Cardiovascular: Negative for cp or palpitations    Gastrointestinal: Negative for nausea, diarrhea and constipation.  Genitourinary: Negative for urgency and frequency.  Skin: Negative for pallor or rash   Neurological: Negative for weakness, light-headedness, numbness and headaches.  Hematological: Negative for adenopathy. Does not bruise/bleed easily.  Psychiatric/Behavioral: Negative for dysphoric mood. The patient is not nervous/anxious.         Objective:   Physical Exam  Constitutional: She appears well-developed and well-nourished. No distress.  HENT:  Head: Normocephalic and atraumatic.  Right Ear: External ear normal.  Mouth/Throat: Oropharynx is clear and moist.  Total cerumen impaction of L ear canal with dec hearing  Nl app TM s/p simple irrigation  R TM clear  Eyes: Conjunctivae and EOM are normal. Pupils are equal, round, and reactive to light. No scleral icterus.  Neck: Normal range of motion. Neck supple.  Lymphadenopathy:    She has no cervical adenopathy.  Neurological: She is alert. No cranial nerve deficit.  Skin: Skin is warm and dry. No rash noted. No erythema. No pallor.          Assessment & Plan:   Problem List Items Addressed This Visit      Nervous and Auditory   Hearing loss of left ear due to cerumen impaction - Primary    Resolved after simple ear irrigation  Adv to call if symptoms worsen Urged not to use q tips

## 2014-10-28 NOTE — Patient Instructions (Signed)
We irrigated your ear today and got a lot of earwax out  You may need that done from time to time Let us know if symptoms return or worsen

## 2014-10-28 NOTE — Progress Notes (Signed)
Pre visit review using our clinic review tool, if applicable. No additional management support is needed unless otherwise documented below in the visit note. 

## 2014-10-29 ENCOUNTER — Ambulatory Visit: Payer: Self-pay | Admitting: Family Medicine

## 2014-10-29 NOTE — Assessment & Plan Note (Signed)
Resolved after simple ear irrigation  Adv to call if symptoms worsen Urged not to use q tips

## 2014-10-30 ENCOUNTER — Other Ambulatory Visit: Payer: Self-pay

## 2014-10-30 ENCOUNTER — Encounter: Payer: Self-pay | Admitting: Family Medicine

## 2014-10-30 NOTE — Telephone Encounter (Signed)
Pt left v/m requesting rx hydrocodone apap. Call when ready for pick up. Last printed on 10/05/14.

## 2014-11-01 ENCOUNTER — Encounter: Payer: Self-pay | Admitting: Family Medicine

## 2014-11-01 DIAGNOSIS — M810 Age-related osteoporosis without current pathological fracture: Secondary | ICD-10-CM | POA: Insufficient documentation

## 2014-11-01 MED ORDER — HYDROCODONE-ACETAMINOPHEN 5-325 MG PO TABS: ORAL_TABLET | ORAL | Status: DC

## 2014-11-01 NOTE — Telephone Encounter (Signed)
Printed.  Thanks.  

## 2014-11-02 NOTE — Telephone Encounter (Signed)
Patient notified by telephone that script is up front ready for pickup. 

## 2014-11-04 ENCOUNTER — Ambulatory Visit (INDEPENDENT_AMBULATORY_CARE_PROVIDER_SITE_OTHER): Payer: Medicare Other | Admitting: Family Medicine

## 2014-11-04 ENCOUNTER — Encounter: Payer: Self-pay | Admitting: Family Medicine

## 2014-11-04 VITALS — BP 124/70 | HR 84 | Temp 99.0°F | Wt 125.2 lb

## 2014-11-04 DIAGNOSIS — M810 Age-related osteoporosis without current pathological fracture: Secondary | ICD-10-CM

## 2014-11-04 NOTE — Progress Notes (Signed)
Pre visit review using our clinic review tool, if applicable. No additional management support is needed unless otherwise documented below in the visit note.  F/u DXA. See plan.  Meds, vitals, and allergies reviewed.   ROS: See HPI.  Otherwise, noncontributory.  nad Exam deferred.

## 2014-11-04 NOTE — Patient Instructions (Addendum)
Go to the lab on the way out.  We'll contact you with your lab report. We'll let you know about how much vitamin D to take when I see your lab.  Consider taking fosamax.   Glad to see you. Take care .

## 2014-11-05 ENCOUNTER — Telehealth: Payer: Self-pay | Admitting: Family Medicine

## 2014-11-05 LAB — VITAMIN D 25 HYDROXY (VIT D DEFICIENCY, FRACTURES): VITD: 21.83 ng/mL — ABNORMAL LOW (ref 30.00–100.00)

## 2014-11-05 NOTE — Assessment & Plan Note (Signed)
D/w pt about path/phys.  Check vit D, will notify pt about taking 1000 vs 50000 unit dose after lab resulted.  D/w pt about rx options, specifically fosamax.  D/w pt about risk and benefit, GI/dental/long bone effects vs other fx risk reduction.  She'll consider.   >15 minutes spent in face to face time with patient, >50% spent in counselling or coordination of care.

## 2014-11-05 NOTE — Telephone Encounter (Signed)
-----   Message from Josetta Huddle, [place] sent at 11/05/2014  8:38 AM EST ----- [facility] will accept a 90 day (#180 pills) Rx as long as her insurance will pay for it. ----- Message -----    From: [name], MD    Sent: 11/05/2014   6:02 AM      To: Josetta Huddle, CMA  Please call [facility] and see if they can accept a 90 day rx for her hydrocodone on the next rx, ie 180 pills. I'm okay with that if they'll accept it.  Thanks.

## 2014-11-05 NOTE — Telephone Encounter (Signed)
Noted, thanks!

## 2014-11-06 ENCOUNTER — Other Ambulatory Visit: Payer: Self-pay | Admitting: Family Medicine

## 2014-11-06 DIAGNOSIS — E559 Vitamin D deficiency, unspecified: Secondary | ICD-10-CM

## 2014-11-06 MED ORDER — VITAMIN D (ERGOCALCIFEROL) 1.25 MG (50000 UNIT) PO CAPS: 50000.0000 [IU] | ORAL_CAPSULE | ORAL | Status: DC

## 2014-12-01 ENCOUNTER — Other Ambulatory Visit: Payer: Self-pay | Admitting: Family Medicine

## 2014-12-01 NOTE — Telephone Encounter (Signed)
Pt left v/m requesting that hydrocodone apap be filled for 90 day supply. Quantity has not been changed until approved by Dr [name].Call when rx ready for pick up.

## 2014-12-02 NOTE — Telephone Encounter (Signed)
Rx called to pharmacy as instructed. 

## 2014-12-02 NOTE — Telephone Encounter (Signed)
Patient notified by telephone that script is up front ready for pickup. 

## 2014-12-02 NOTE — Telephone Encounter (Signed)
Hydrocodone printed, please call in xanax.  Thanks .

## 2014-12-09 ENCOUNTER — Ambulatory Visit (INDEPENDENT_AMBULATORY_CARE_PROVIDER_SITE_OTHER): Payer: Medicare Other | Admitting: Family Medicine

## 2014-12-09 ENCOUNTER — Encounter: Payer: Self-pay | Admitting: Family Medicine

## 2014-12-09 VITALS — BP 124/74 | HR 88 | Temp 98.4°F | Wt 124.5 lb

## 2014-12-09 DIAGNOSIS — F411 Generalized anxiety disorder: Secondary | ICD-10-CM

## 2014-12-09 NOTE — Patient Instructions (Signed)
Recheck vit D in about 2 months and I'll work on the [employer].  Take care.  Glad to see you.

## 2014-12-09 NOTE — Progress Notes (Signed)
Pre visit review using our clinic review tool, if applicable. No additional management support is needed unless otherwise documented below in the visit note.  Anxiety and insomnia, prev with on 2-3 hours of sleep, now much improved 0.25mg  xanax BID.  No ADE. No sedation, no complications.  She can sometimes take a 1/2 dose and she does that when feasible.   "We worked on it and worked on it and finally got it right and now [employer] is messing with it."   She feels better on the higher dose of vitamin D.  Meds, vitals, and allergies reviewed.   ROS: See HPI.  Otherwise, noncontributory.  nad ncat Neck supple, no LA rrr ctab abd soft Ext w/o edema.

## 2014-12-10 NOTE — Assessment & Plan Note (Signed)
"  We worked on it and worked on it and finally got it right and now [employer] is messing with it."  No reason to change meds.  We'll work on getting the PA started.   No ADE on med. Good effect.

## 2014-12-11 ENCOUNTER — Telehealth: Payer: Self-pay

## 2014-12-11 NOTE — Telephone Encounter (Signed)
pt received letter from [facility] about not covering thyroid med. Pt said she takes thyroid med due to having cancer. Pt will bring letter by office next week to see what is requested.

## 2014-12-14 ENCOUNTER — Telehealth: Payer: Self-pay | Admitting: Family Medicine

## 2014-12-14 ENCOUNTER — Telehealth: Payer: Self-pay | Admitting: *Deleted

## 2014-12-14 DIAGNOSIS — E038 Other specified hypothyroidism: Secondary | ICD-10-CM

## 2014-12-14 NOTE — Telephone Encounter (Signed)
Pt came in with letter from insurance company regarding Synthroid.  Pt wants to stay on brand name, insurance company wants to change to generic.Pt stated that nurse is already aware of situation.  Put on lugene's desk

## 2014-12-15 NOTE — Telephone Encounter (Signed)
Letter written and awaiting Dr. Josefine Class approval.

## 2014-12-24 NOTE — Telephone Encounter (Signed)
Placed in Dr. Duncan's In Box. 

## 2014-12-24 NOTE — Telephone Encounter (Signed)
Patient brought this in and I attached it to the request for Alprazolam and didn't realize it was for Synthroid.  My apologies to patient and Dr. [name].  If you would please give me some reasons as to why this needs to be brand-name, I will construct a letter.

## 2014-12-24 NOTE — Telephone Encounter (Signed)
Pt left v/m; [employer] advisor, Sallyanne Kuster gave pt his contact # [phone] and if Mr Megan Salon can help in any way to get alprazolam and thyroid med approved, give Mr Megan Salon a call.

## 2014-12-25 ENCOUNTER — Encounter: Payer: Self-pay | Admitting: *Deleted

## 2014-12-25 NOTE — Telephone Encounter (Signed)
It would be okay to change to generic synthroid, as long as the dose was the same. We could recheck TSH 2 months after the change to make sure she was still appropriately dosed.  Let the patient know; I put in the TSH order.  Thanks.

## 2014-12-25 NOTE — Telephone Encounter (Signed)
Faxed notification to [facility].  Left detailed message on voicemail and mailed patient a letter indicating this change as appropriate and scheduled lab appointment in 2 months.  Notification of appointment was mailed to patient.

## 2014-12-28 ENCOUNTER — Telehealth: Payer: Self-pay | Admitting: Family Medicine

## 2014-12-28 NOTE — Telephone Encounter (Signed)
Patient's agent,Robert, is asking for you to call him back.  Patient has been on the brand name of Synthroid.  Patient is asking for an exception for [employer]BS that she not be put on the generic. She's on Xanax and it's not covered.  Is there another medication that she could try?  If you can't reach him at the number he left, you can call [phone].

## 2014-12-28 NOTE — Telephone Encounter (Signed)
I called and talked with patient and Herbie [place].  I didn't give out confidential info to [place].  Patient agrees to pay out of pocket for xanax for now, to avoid a med change.  Evidently, she has h/o difficult titration of synthroid years ago with mult med changes and wanted to continue with the current name brand thyroid medicine.  Given that extra information, this is a reasonable request by the patient.  If you have a form for the levothyroid, then I'll fill it out to request the continuation.  If not, then the phone number for [employer]BS is [phone].  Please ask about getting the appropriate form sent to [name] at [facility].  Thanks.

## 2014-12-30 NOTE — Telephone Encounter (Signed)
Called number listed for [employer] and the office was closed for the day. Will have to try again later.

## 2015-01-06 NOTE — Telephone Encounter (Signed)
Spoke to [employer] with [facility] and was advised that she will fax the paperwork to be completed and faxed back.

## 2015-01-06 NOTE — Telephone Encounter (Signed)
Prior authorization paperwork from [place] is on your desk to be completed and faxed back.

## 2015-01-07 NOTE — Telephone Encounter (Signed)
[place] called from [place] with question about name brand synthroid status; advised prior Josem Kaufmann has been done and waiting for response to PA request. [place] voiced understanding.

## 2015-01-07 NOTE — Telephone Encounter (Signed)
I'll work on the hard copy.

## 2015-01-12 NOTE — Telephone Encounter (Signed)
Spoke with patient and informed her of the Rejection for the brand name synthroid.  Patient agrees to try generic and has actually already filled that prescription but has not started it yet.

## 2015-01-18 MED ORDER — LEVOTHYROXINE SODIUM 88 MCG PO TABS: 88.0000 ug | ORAL_TABLET | Freq: Every day | ORAL | Status: DC

## 2015-01-18 NOTE — Telephone Encounter (Signed)
Re-sent to [facility] indicating the reaction to the generic medication.

## 2015-01-18 NOTE — Addendum Note (Signed)
Addended by: [name] on: 01/18/2015 09:44 AM   Modules accepted: Orders, Medications

## 2015-01-18 NOTE — Telephone Encounter (Signed)
Resent with a DAW to [place].

## 2015-01-18 NOTE — Telephone Encounter (Signed)
Pt left v/m; pt took generic synthroid and on 01/17/15 pt was nauseated and stomach hurt and pt was dizzy; pt cannot take the generic synthroid and pt request cb. [facility].

## 2015-01-26 NOTE — Telephone Encounter (Signed)
Approval for brand name Synthroid received.  Approval letter scanned.  Form for Alprazolam exception placed in Dr. Josefine Class box.

## 2015-01-29 ENCOUNTER — Other Ambulatory Visit (INDEPENDENT_AMBULATORY_CARE_PROVIDER_SITE_OTHER): Payer: Medicare Other

## 2015-01-29 DIAGNOSIS — E559 Vitamin D deficiency, unspecified: Secondary | ICD-10-CM

## 2015-01-29 DIAGNOSIS — E038 Other specified hypothyroidism: Secondary | ICD-10-CM

## 2015-01-29 LAB — VITAMIN D 25 HYDROXY (VIT D DEFICIENCY, FRACTURES): VITD: 40.33 ng/mL (ref 30.00–100.00)

## 2015-01-29 LAB — TSH: TSH: 1.68 u[IU]/mL (ref 0.35–4.50)

## 2015-01-29 NOTE — Telephone Encounter (Signed)
Pt is concerned that she cannot take generic thyroid med and wants to know if can get ins to approve the name brand; advised pt approval for name brand synthroid was received on 01/26/15 and spoke with Pilar Plate pt can get refill of name brand synthroid after 02/02/15. Pilar Plate d/c generic thyroid so there would be no confusion at time of refill. Pt voiced understanding and will contact [facility] on 02/02/15.

## 2015-01-31 ENCOUNTER — Other Ambulatory Visit: Payer: Self-pay | Admitting: Family Medicine

## 2015-01-31 MED ORDER — CHOLECALCIFEROL 25 MCG (1000 UT) PO CHEW: 1000.0000 [IU] | CHEWABLE_TABLET | Freq: Every day | ORAL | Status: DC

## 2015-02-08 NOTE — Telephone Encounter (Signed)
Denial letter for Alprazolam received and placed in Dr. Josefine Class In [place].

## 2015-02-09 NOTE — Telephone Encounter (Signed)
Notify pt.  Will have to try another med vs pay out of pocket.  Thanks.

## 2015-02-09 NOTE — Telephone Encounter (Signed)
Patient advised.  Patient states she will check with pharmacy about the cost out of pocket and let us know if she needs to try another medication.

## 2015-02-15 NOTE — Telephone Encounter (Addendum)
Pt left v/m; pt cannot afford $80.00 out of pocket for xanax and pt request different med sent to [facility]. Pt request cb.

## 2015-02-15 NOTE — Telephone Encounter (Signed)
error 

## 2015-02-16 MED ORDER — LORAZEPAM 0.5 MG PO TABS: 0.2500 mg | ORAL_TABLET | Freq: Two times a day (BID) | ORAL | Status: DC | PRN

## 2015-02-16 NOTE — Telephone Encounter (Signed)
Please call in lorazepam.  Thanks.   

## 2015-02-16 NOTE — Addendum Note (Signed)
Addended by: [name] on: 02/16/2015 05:34 AM   Modules accepted: Orders, Medications

## 2015-02-16 NOTE — Telephone Encounter (Signed)
Medication phoned to pharmacy.  

## 2015-02-17 ENCOUNTER — Other Ambulatory Visit: Payer: Self-pay | Admitting: Family Medicine

## 2015-02-17 DIAGNOSIS — E039 Hypothyroidism, unspecified: Secondary | ICD-10-CM

## 2015-02-22 ENCOUNTER — Other Ambulatory Visit: Payer: Medicare Other

## 2015-02-22 ENCOUNTER — Telehealth: Payer: Self-pay | Admitting: Family Medicine

## 2015-02-22 NOTE — Telephone Encounter (Signed)
Patient advised. Appointment cancelled.

## 2015-02-22 NOTE — Telephone Encounter (Signed)
Pt has had conflicting information about whether or not she is supposed to be coming in for lab appt today.  She has been told to come in and then not to come in and then to come in again. Pt requests call back at [phone] prior to 10 am, as that is when her appt is. Thanks.

## 2015-03-01 ENCOUNTER — Telehealth: Payer: Self-pay | Admitting: *Deleted

## 2015-03-01 MED ORDER — CILIDINIUM-CHLORDIAZEPOXIDE 2.5-5 MG PO CAPS: 1.0000 | ORAL_CAPSULE | Freq: Every day | ORAL | Status: DC

## 2015-03-01 NOTE — Telephone Encounter (Signed)
Printed.  I think she can only get 1 refill on this, to make a 6 month supply.  Thanks .

## 2015-03-01 NOTE — Telephone Encounter (Signed)
Patient called stating that the representative with [employer] left her a message that she can get a 3 month supply of generic [name] for $15. Patient stated that she would like a written script for a 3 months supply with a year of refills for her to pickup. Call patient when ready for pickup.

## 2015-03-02 ENCOUNTER — Other Ambulatory Visit: Payer: Self-pay | Admitting: *Deleted

## 2015-03-02 NOTE — Telephone Encounter (Signed)
Patient requests pain medication refill to pick up along with the previous [name] Rx.  Last Filled:    180 tablet 0 RF on 12/02/2014  Please advise.

## 2015-03-02 NOTE — Telephone Encounter (Signed)
Patient advised.  Rx left at front desk for pick up. 

## 2015-03-03 MED ORDER — HYDROCODONE-ACETAMINOPHEN 5-325 MG PO TABS: ORAL_TABLET | ORAL | Status: DC

## 2015-03-03 NOTE — Telephone Encounter (Signed)
Patient notified by telephone that scripts are up front ready for pickup.

## 2015-03-03 NOTE — Telephone Encounter (Signed)
Printed.  Thanks.  

## 2015-03-09 ENCOUNTER — Telehealth: Payer: Self-pay

## 2015-03-09 DIAGNOSIS — G4700 Insomnia, unspecified: Secondary | ICD-10-CM

## 2015-03-09 NOTE — Telephone Encounter (Signed)
Ins agent with [employer] (did not leave a name) left v/m requesting prior auth for lorazepam and BC received a fax for Synthroid name brand on 01/06/15 but needs to be worded differently; needs noted that generic thyroid does not work for pt and pt needs formulary exception form for name brand Synthroid only. Request cb to pt at [phone].

## 2015-03-09 NOTE — Telephone Encounter (Signed)
Forms were faxed for each medication.  Filled out all that I could and placed in Dr. Josefine Class In Box for completion.

## 2015-03-10 DIAGNOSIS — G4700 Insomnia, unspecified: Secondary | ICD-10-CM | POA: Insufficient documentation

## 2015-03-10 NOTE — Telephone Encounter (Signed)
Forms done, please scan and send.  Thanks.

## 2015-03-10 NOTE — Telephone Encounter (Signed)
Form faxed as instructed. 

## 2015-03-12 NOTE — Telephone Encounter (Signed)
[name] with [facility] Medicare left v/m that request for Synthroid has been approved from 03/10/2015 -03/09/2016. Approval letter to follow.

## 2015-03-12 NOTE — Telephone Encounter (Signed)
Colletta Maryland with [facility] Medicare left v/m alprazolam was approved from 03/10/15 -03/09/2016. Approval letter to follow.

## 2015-03-24 ENCOUNTER — Other Ambulatory Visit: Payer: Self-pay | Admitting: *Deleted

## 2015-03-24 NOTE — Telephone Encounter (Signed)
Opened in error

## 2015-03-24 NOTE — Telephone Encounter (Signed)
Last filled #90 03/03/15 at [facility].  Patient is requesting a hard copy to send for mail order.  Please call when ready for pick up.

## 2015-03-30 ENCOUNTER — Telehealth: Payer: Self-pay | Admitting: *Deleted

## 2015-03-30 MED ORDER — CILIDINIUM-CHLORDIAZEPOXIDE 2.5-5 MG PO CAPS: 1.0000 | ORAL_CAPSULE | Freq: Every day | ORAL | Status: DC

## 2015-03-30 NOTE — Telephone Encounter (Signed)
Printed, please send.  Thanks.  

## 2015-03-30 NOTE — Telephone Encounter (Signed)
Medication faxed to pharmacy.  Patient advised.

## 2015-03-30 NOTE — Telephone Encounter (Signed)
Patient left a voicemail stating that the approval for her [name] has been denied. Patient stated that someone with her insurance company told her that [name] was used for stomach problems and anxiety. Patient stated that if Dr. [name] would submit the script and stated that she is using it for stomach problems they will cover it for a lower price of $15. Patient wants to know if Dr. [name] will do this for her?

## 2015-04-05 ENCOUNTER — Telehealth: Payer: Self-pay

## 2015-04-05 NOTE — Telephone Encounter (Signed)
Pt left v/m; still having problems in getting medication(did not leave name of med); pt request cb to Dr Sallyanne Kuster with [facility] at [phone]. No answer and no v/m so could not have pt cb about name of med in question.

## 2015-04-14 NOTE — Telephone Encounter (Signed)
Spoke with pt: [name] is the medicine. Pt request call to Dr Megan Salon with [facility] at [phone]. Pt is not sure if prior auth or not but thinks if [facility] speaks with Dr Megan Salon pt will be able to get med with ins coverage.

## 2015-04-15 NOTE — Telephone Encounter (Signed)
Dr. Megan Salon advised.

## 2015-04-15 NOTE — Telephone Encounter (Signed)
I don't know of a good substitution and to my knowledge clidinium isn't available as a separate rx.  If Dr. Megan Salon is going to suggest that, then he/she should already know and should be willing to provide that information for me to review.  Otherwise, they can just cover the medicine that we already know to be effective for patient.

## 2015-04-15 NOTE — Telephone Encounter (Signed)
Dr. Megan Salon states that [name] is a combination drug (Chlordiazepoxide/Clidinium) and her insurance (nor any insurance) will cover it.  There is something called [employer] that will cover Chlordiazepoxide for $15 (90 day supply).  Is there a substitute for Clidinium that they could check to see if it might be either covered by her insurance or Rx Outreach?  Please advise.

## 2015-06-01 ENCOUNTER — Other Ambulatory Visit: Payer: Self-pay | Admitting: Family Medicine

## 2015-06-01 NOTE — Telephone Encounter (Signed)
Received refill request electronically Last refill Hydrocodone 03/03/15 #180, Lorazepam 02/16/15 #40/2 Last office visit 12/09/14 Is it okay to refill medication?

## 2015-06-02 NOTE — Telephone Encounter (Signed)
Pt left v/m requesting status of hydrocodone and lorazepam refill.Please advise.

## 2015-06-02 NOTE — Telephone Encounter (Signed)
Spoke to pt and informed her Rx is available for pickup from the front desk 

## 2015-06-02 NOTE — Telephone Encounter (Signed)
Printed.  Thanks.  

## 2015-06-22 ENCOUNTER — Encounter: Payer: Self-pay | Admitting: Gastroenterology

## 2015-08-30 ENCOUNTER — Other Ambulatory Visit: Payer: Self-pay | Admitting: Family Medicine

## 2015-08-30 MED ORDER — OMEPRAZOLE 40 MG PO CPDR: DELAYED_RELEASE_CAPSULE | ORAL | Status: DC

## 2015-08-30 NOTE — Telephone Encounter (Signed)
Pt left v/m requesting rx hydrocodone apap. Call when ready for pick up.last printed # 180 on 06/12/15. Last annual 08/24/14 an no future appt scheduled.

## 2015-08-30 NOTE — Telephone Encounter (Addendum)
Lorazepam, and mirtazapine last filled 08/03/15. Last ov was a med f/u on 12/09/14. No future appt scheduled.

## 2015-08-30 NOTE — Telephone Encounter (Signed)
Received refill request electronically Last refill 03/30/15 #90/1 Last office visit 12/09/14 Is it okay to refill?

## 2015-08-31 MED ORDER — LORAZEPAM 0.5 MG PO TABS: 0.2500 mg | ORAL_TABLET | Freq: Two times a day (BID) | ORAL | Status: DC | PRN

## 2015-08-31 MED ORDER — HYDROCODONE-ACETAMINOPHEN 5-325 MG PO TABS: ORAL_TABLET | ORAL | Status: DC

## 2015-08-31 MED ORDER — MIRTAZAPINE 15 MG PO TABS: ORAL_TABLET | ORAL | Status: DC

## 2015-08-31 NOTE — Telephone Encounter (Signed)
Rx called in as directed.   

## 2015-08-31 NOTE — Telephone Encounter (Signed)
Please call in.  Thanks.   

## 2015-08-31 NOTE — Telephone Encounter (Signed)
This was printed 03/30/15, enough to carry through 09/30/15.  Please clarify with patient.  Why is #143 requested instead of #90? Let me know.   Thanks.

## 2015-08-31 NOTE — Telephone Encounter (Signed)
Patient states that she only has 2 pills left, and that she needs a refill on this medication. Patient is unsure why #143 was sent in, instead of #90.

## 2015-08-31 NOTE — Telephone Encounter (Signed)
Patient notified

## 2015-08-31 NOTE — Telephone Encounter (Signed)
Printed hydrocodone and lorazepam.  Mirtazapine sent.  Thanks.  Needs CPE scheduled.

## 2015-09-01 ENCOUNTER — Telehealth: Payer: Self-pay

## 2015-09-01 MED ORDER — LISINOPRIL 10 MG PO TABS: 10.0000 mg | ORAL_TABLET | Freq: Every evening | ORAL | Status: DC

## 2015-09-01 NOTE — Telephone Encounter (Signed)
Pt came by office to ck on refill status; spoke with Vicente Males at [facility] rx ready for pick up now; pt voiced understanding.

## 2015-09-01 NOTE — Telephone Encounter (Signed)
Pt request refill lisinopril to [facility]; pt does not want to schedule med wellness today. Pt will cb before med is out. Refill done # 30 x 0.

## 2015-09-02 NOTE — Telephone Encounter (Signed)
Pt request status of hydrocodone and lorazepam;advised pt at front desk for pick up. Pt voiced understanding.

## 2015-09-09 ENCOUNTER — Other Ambulatory Visit: Payer: Self-pay | Admitting: Family Medicine

## 2015-09-09 DIAGNOSIS — M810 Age-related osteoporosis without current pathological fracture: Secondary | ICD-10-CM

## 2015-09-09 DIAGNOSIS — E039 Hypothyroidism, unspecified: Secondary | ICD-10-CM

## 2015-09-09 DIAGNOSIS — I10 Essential (primary) hypertension: Secondary | ICD-10-CM

## 2015-09-10 ENCOUNTER — Other Ambulatory Visit: Payer: Medicare Other

## 2015-09-13 ENCOUNTER — Other Ambulatory Visit (INDEPENDENT_AMBULATORY_CARE_PROVIDER_SITE_OTHER): Payer: Medicare Other

## 2015-09-13 DIAGNOSIS — I10 Essential (primary) hypertension: Secondary | ICD-10-CM | POA: Diagnosis not present

## 2015-09-13 DIAGNOSIS — M810 Age-related osteoporosis without current pathological fracture: Secondary | ICD-10-CM | POA: Diagnosis not present

## 2015-09-13 LAB — LIPID PANEL
Cholesterol: 215 mg/dL — ABNORMAL HIGH (ref 0–200)
HDL: 52 mg/dL (ref >39.00)
LDL Cholesterol: 132 mg/dL — ABNORMAL HIGH (ref 0–99)
NonHDL: 162.8
Total CHOL/HDL Ratio: 4
Triglycerides: 154 mg/dL — ABNORMAL HIGH (ref 0.0–149.0)
VLDL: 30.8 mg/dL (ref 0.0–40.0)

## 2015-09-13 LAB — COMPREHENSIVE METABOLIC PANEL
ALT: 9 U/L (ref 0–35)
AST: 21 U/L (ref 0–37)
Albumin: 4 g/dL (ref 3.5–5.2)
Alkaline Phosphatase: 76 U/L (ref 39–117)
BUN: 10 mg/dL (ref 6–23)
CO2: 31 mEq/L (ref 19–32)
Calcium: 9.4 mg/dL (ref 8.4–10.5)
Chloride: 102 mEq/L (ref 96–112)
Creatinine, Ser: 0.94 mg/dL (ref 0.40–1.20)
GFR: 60.43 mL/min (ref >60.00)
Glucose, Bld: 96 mg/dL (ref 70–99)
Potassium: 4.6 mEq/L (ref 3.5–5.1)
Sodium: 141 mEq/L (ref 135–145)
Total Bilirubin: 0.6 mg/dL (ref 0.2–1.2)
Total Protein: 7.4 g/dL (ref 6.0–8.3)

## 2015-09-13 LAB — TSH: TSH: 1.84 u[IU]/mL (ref 0.35–4.50)

## 2015-09-13 LAB — VITAMIN D 25 HYDROXY (VIT D DEFICIENCY, FRACTURES): VITD: 29.28 ng/mL — ABNORMAL LOW (ref 30.00–100.00)

## 2015-09-17 ENCOUNTER — Encounter: Payer: Self-pay | Admitting: Family Medicine

## 2015-09-17 ENCOUNTER — Ambulatory Visit (INDEPENDENT_AMBULATORY_CARE_PROVIDER_SITE_OTHER): Payer: Medicare Other | Admitting: Family Medicine

## 2015-09-17 VITALS — BP 124/80 | HR 90 | Temp 97.8°F | Ht 62.0 in | Wt 120.8 lb

## 2015-09-17 DIAGNOSIS — E039 Hypothyroidism, unspecified: Secondary | ICD-10-CM

## 2015-09-17 DIAGNOSIS — Z0000 Encounter for general adult medical examination without abnormal findings: Secondary | ICD-10-CM | POA: Diagnosis not present

## 2015-09-17 DIAGNOSIS — I10 Essential (primary) hypertension: Secondary | ICD-10-CM | POA: Diagnosis not present

## 2015-09-17 DIAGNOSIS — K589 Irritable bowel syndrome without diarrhea: Secondary | ICD-10-CM | POA: Diagnosis not present

## 2015-09-17 DIAGNOSIS — M810 Age-related osteoporosis without current pathological fracture: Secondary | ICD-10-CM

## 2015-09-17 DIAGNOSIS — K2270 Barrett's esophagus without dysplasia: Secondary | ICD-10-CM

## 2015-09-17 DIAGNOSIS — M545 Low back pain: Secondary | ICD-10-CM

## 2015-09-17 DIAGNOSIS — Z23 Encounter for immunization: Secondary | ICD-10-CM

## 2015-09-17 MED ORDER — LISINOPRIL 10 MG PO TABS: 10.0000 mg | ORAL_TABLET | Freq: Every evening | ORAL | Status: DC

## 2015-09-17 MED ORDER — OMEPRAZOLE 40 MG PO CPDR: DELAYED_RELEASE_CAPSULE | ORAL | Status: DC

## 2015-09-17 MED ORDER — LEVOTHYROXINE SODIUM 88 MCG PO TABS: 88.0000 ug | ORAL_TABLET | Freq: Every day | ORAL | Status: DC

## 2015-09-17 MED ORDER — MIRTAZAPINE 15 MG PO TABS: ORAL_TABLET | ORAL | Status: DC

## 2015-09-17 NOTE — Patient Instructions (Addendum)
Call about the mammogram in December.   Call about an eye exam.  Don't change your meds for now.  When you need refill, then have [facility] notify us.  Take care.  Glad to see you.

## 2015-09-17 NOTE — Progress Notes (Signed)
Pre visit review using our clinic review tool, if applicable. No additional management support is needed unless otherwise documented below in the visit note.  I have personally reviewed the Medicare Annual Wellness questionnaire and have noted 1. The patient's medical and social history 2. Their use of alcohol, tobacco or illicit drugs 3. Their current medications and supplements 4. The patient's functional ability including ADL's, fall risks, home safety risks and hearing or visual             impairment. 5. Diet and physical activities 6. Evidence for depression or mood disorders  The patients weight, height, BMI have been recorded in the chart and visual acuity is per eye clinic.  I have made referrals, counseling and provided education to the patient based review of the above and I have provided the pt with a written personalized care plan for preventive services.  Provider list updated- see scanned forms.  Routine anticipatory guidance given to patient.  See health maintenance. Flu 2016 Shingles 2011 PNA prev done, she'll get prevnar later as flu shot done today per patient preference Tetanus 2013 Colonoscopy not due given age >86. D/w pt.  She does well with her current meds.  BM daily.  She had h/o possible stricture.  She wanted to avoid other procedures now.  She isn't passing blood.   Breast cancer screening- 10/2014. Advance directive- sister in law Carrington Clamp designated if patient were incapacitated.  Cognitive function addressed- see scanned forms- and if abnormal then additional documentation follows.   IBS. Insurance wouldn't cover librax. Had done well on it. She continues on med.  See above.    Hypothyroid. Compliant with replacement. No ADE. TSH wnl. No neck mass.   Hypertension: Using medication without problems or lightheadedness: yes Chest pain with exertion:no Edema:no Short of breath:no  Labs d/w pt. lipids improved from prev.   H/o Barretts on  PPI, doing well. No ADE. No GERD sx.   Chronic back pain. H/o hip fracture. Takes current meds w/o ADE. Doing well, functional with current meds. She needs to get back with her exercise routine. D/w pt.   PMH and SH reviewed  Meds, vitals, and allergies reviewed.   ROS: See HPI.  Otherwise negative.    GEN: nad, alert and oriented HEENT: mucous membranes moist NECK: supple w/o LA CV: rrr. PULM: ctab, no inc wob ABD: soft, +bs EXT: no edema SKIN: no acute rash

## 2015-09-20 NOTE — Assessment & Plan Note (Signed)
Sx controlled, continue as is.  She agrees.

## 2015-09-20 NOTE — Assessment & Plan Note (Signed)
Flu 2016 Shingles 2011 PNA prev done, she'll get prevnar later as flu shot done today per patient preference Tetanus 2013 Colonoscopy not due given age >5. D/w pt.  She does well with her current meds.  BM daily.  She had h/o possible stricture.  She wanted to avoid other procedures now.  She isn't passing blood.   Breast cancer screening- 10/2014. Advance directive- sister in law Carrington Clamp designated if patient were incapacitated.  Cognitive function addressed- see scanned forms- and if abnormal then additional documentation follows.

## 2015-09-20 NOTE — Assessment & Plan Note (Signed)
H/o hip fracture. Takes current meds w/o ADE. Doing well, functional with current meds. She needs to get back with her exercise routine. D/w pt.

## 2015-09-20 NOTE — Assessment & Plan Note (Signed)
Controlled, continue as is.  No ADE on med.

## 2015-09-20 NOTE — Assessment & Plan Note (Signed)
Declined treatment.  D/w pt.

## 2015-09-20 NOTE — Assessment & Plan Note (Signed)
Continue PPI ?

## 2015-09-20 NOTE — Assessment & Plan Note (Signed)
TSH wnl, continue as is.  She agrees.

## 2015-11-29 ENCOUNTER — Other Ambulatory Visit: Payer: Self-pay | Admitting: Family Medicine

## 2015-11-30 NOTE — Telephone Encounter (Signed)
Electronic refill request.   Last Filled:   Vicodin 180 tablet 0 08/31/2015  Last Filled:   Lorazepam 40 tablet 2 08/31/2015  Last CPE:  09/17/2015  Please advise.

## 2015-12-01 NOTE — Telephone Encounter (Signed)
Pt left v/m requesting status of hydrocodone rx and request cb.

## 2015-12-01 NOTE — Telephone Encounter (Signed)
Printed.  Thanks.  

## 2015-12-01 NOTE — Telephone Encounter (Signed)
Patient came to the office and Dr. [name] handed her the written script.

## 2015-12-02 ENCOUNTER — Other Ambulatory Visit: Payer: Self-pay | Admitting: *Deleted

## 2016-02-25 ENCOUNTER — Other Ambulatory Visit: Payer: Self-pay | Admitting: Family Medicine

## 2016-02-25 NOTE — Telephone Encounter (Signed)
Electronic refill request. Last Filled:    90 capsule 1 08/31/2015  Last office visit:   09/17/15 CPE   Please advise.

## 2016-02-27 NOTE — Telephone Encounter (Signed)
Please call in.  Thanks.   

## 2016-02-28 ENCOUNTER — Other Ambulatory Visit: Payer: Self-pay

## 2016-02-28 MED ORDER — HYDROCODONE-ACETAMINOPHEN 5-325 MG PO TABS: ORAL_TABLET | ORAL | Status: DC

## 2016-02-28 MED ORDER — LORAZEPAM 0.5 MG PO TABS: ORAL_TABLET | ORAL | Status: DC

## 2016-02-28 NOTE — Telephone Encounter (Signed)
Rx called to pharmacy as instructed. 

## 2016-02-28 NOTE — Telephone Encounter (Signed)
Printed.  Thanks.  

## 2016-02-28 NOTE — Telephone Encounter (Signed)
Pt left v/m requesting rx hydrocodone apap(last printed # 180 on 12/01/15) and lorazepam (last printed # 40 x 2 on 12/01/15). Call when ready for pick up.last seen annual 09/17/15.

## 2016-02-29 ENCOUNTER — Encounter: Payer: Self-pay | Admitting: Internal Medicine

## 2016-02-29 ENCOUNTER — Ambulatory Visit (INDEPENDENT_AMBULATORY_CARE_PROVIDER_SITE_OTHER): Payer: Medicare Other | Admitting: Internal Medicine

## 2016-02-29 VITALS — BP 120/74 | HR 85 | Temp 98.1°F | Wt 117.0 lb

## 2016-02-29 DIAGNOSIS — R51 Headache: Secondary | ICD-10-CM

## 2016-02-29 DIAGNOSIS — R519 Headache, unspecified: Secondary | ICD-10-CM

## 2016-02-29 NOTE — Patient Instructions (Signed)
Allergic Rhinitis Allergic rhinitis is when the mucous membranes in the nose respond to allergens. Allergens are particles in the air that cause your body to have an allergic reaction. This causes you to release allergic antibodies. Through a chain of events, these eventually cause you to release histamine into the blood stream. Although meant to protect the body, it is this release of histamine that causes your discomfort, such as frequent sneezing, congestion, and an itchy, runny nose.  CAUSES Seasonal allergic rhinitis (hay fever) is caused by pollen allergens that may come from grasses, trees, and weeds. Year-round allergic rhinitis (perennial allergic rhinitis) is caused by allergens such as house dust mites, pet dander, and mold spores. SYMPTOMS  Nasal stuffiness (congestion).  Itchy, runny nose with sneezing and tearing of the eyes. DIAGNOSIS Your health care provider can help you determine the allergen or allergens that trigger your symptoms. If you and your health care provider are unable to determine the allergen, skin or blood testing may be used. Your health care provider will diagnose your condition after taking your health history and performing a physical exam. Your health care provider may assess you for other related conditions, such as asthma, pink eye, or an ear infection. TREATMENT Allergic rhinitis does not have a cure, but it can be controlled by:  Medicines that block allergy symptoms. These may include allergy shots, nasal sprays, and oral antihistamines.  Avoiding the allergen. Hay fever may often be treated with antihistamines in pill or nasal spray forms. Antihistamines block the effects of histamine. There are over-the-counter medicines that may help with nasal congestion and swelling around the eyes. Check with your health care provider before taking or giving this medicine. If avoiding the allergen or the medicine prescribed do not work, there are many new medicines  your health care provider can prescribe. Stronger medicine may be used if initial measures are ineffective. Desensitizing injections can be used if medicine and avoidance does not work. Desensitization is when a patient is given ongoing shots until the body becomes less sensitive to the allergen. Make sure you follow up with your health care provider if problems continue. HOME CARE INSTRUCTIONS It is not possible to completely avoid allergens, but you can reduce your symptoms by taking steps to limit your exposure to them. It helps to know exactly what you are allergic to so that you can avoid your specific triggers. SEEK MEDICAL CARE IF:  You have a fever.  You develop a cough that does not stop easily (persistent).  You have shortness of breath.  You start wheezing.  Symptoms interfere with normal daily activities.   This information is not intended to replace advice given to you by your health care provider. Make sure you discuss any questions you have with your health care provider.   Document Released: 08/08/2001 Document Revised: 12/04/2014 Document Reviewed: 07/21/2013 Elsevier Interactive Patient Education 2016 [employer]  

## 2016-02-29 NOTE — Progress Notes (Signed)
Pre visit review using our clinic review tool, if applicable. No additional management support is needed unless otherwise documented below in the visit note. 

## 2016-02-29 NOTE — Progress Notes (Signed)
Subjective:    Patient ID: [name], female    DOB: 1930-01-03, 82 y.o.   MRN: [id number]  HPI   Pt presents to the clinic today with c/o right sided facial pain. This started 1 week ago. She has noticed some associated swelling. She describes its as a "tightness" that hurts more when she presses or moves her jaw. She also c/o right ear "fullness" that comes and goes. She has a h/o seasonal allergies and uses a saline nasal spray to control sxs. She denies any sxs on the left side of her face. She denies cough, sore throat, or increased congestion.  She has not had sick contacts. She denies any trauma to the face.   Review of Systems  Past Medical History  Diagnosis Date  . Personal history of colonic polyps     colonoscpy, removed 1995  . Thyroid carcinoma ([place]) 01/1994  . Hyperlipemia   . Hypertension   . Hypothyroidism   . Hiatal hernia   . Barrett esophagus   . UTI (urinary tract infection)     Chronic  . Hiatal hernia     EGD 1998, 2007 ( Dr. Sharlett Iles)  . Diplopia     history of,hospital 2/26-2/27/10  . Diverticulitis     colonoscopy 1995.  CT 01/31/09, 02/02/09  . Osteoporosis DXA 2015    Current Outpatient Prescriptions  Medication Sig Dispense Refill  . Cholecalciferol ([facility] VITAMIN D3) 1000 UNITS CHEW Chew 1 tablet (1,000 Units total) by mouth daily.    . clidinium-chlordiazePOXIDE ([name]) 5-2.5 MG capsule TAKE 1 CAPSULE BY MOUTH DAILY 90 capsule 1  . HYDROcodone-acetaminophen (NORCO/VICODIN) 5-325 MG tablet TAKE 1/2 TO 1 TABLET BY MOUTH EVERY 6 HOURS AS NEEDED FOR PAIN. 180 tablet 0  . levothyroxine (SYNTHROID, LEVOTHROID) 88 MCG tablet Take 1 tablet (88 mcg total) by mouth daily. 90 tablet 3  . lisinopril (PRINIVIL,ZESTRIL) 10 MG tablet Take 1 tablet (10 mg total) by mouth Nightly. 90 tablet 3  . loratadine ([facility] ALLERGY RELIEF) 10 MG dissolvable tablet Take 10 mg by mouth daily as needed for allergies.     Marland Kitchen LORazepam (ATIVAN) 0.5 MG tablet TAKE 1/2 TO 1  TABLET BY MOUTH 2 TIMES DAILY AS NEEDED FOR ANXIETY 40 tablet 2  . mirtazapine (REMERON) 15 MG tablet TAKE 1 TABLET BY MOUTH EVERY NIGHT AT BEDTIME 90 tablet 3  . Multiple Vitamin (MULTIVITAMIN PO) Take by mouth daily.      Marland Kitchen omeprazole (PRILOSEC) 40 MG capsule One tab by mouth 34min before breakfast 90 capsule 3  . polyethylene glycol powder (MIRALAX) powder Take 17 g by mouth every evening.      No current facility-administered medications for this visit.    Allergies  Allergen Reactions  . Bentyl [Dicyclomine Hcl] Other (See Comments)    abd pain  . Ciprofloxacin     Can cause GI upset, esp if taken with metamucil, o/w it is tolerated  . Cortisone Acetate     REACTION: nausea  . Gabapentin     REACTION: severe dizziness  . Metoclopramide Hcl   . Penicillins     REACTION: pass out  . Promethazine Hcl     REACTION: unspecified    Family History  Problem Relation Age of Onset  . Cancer Brother     Pharyngeal   . Heart disease Brother     MI  . Diabetes Brother   . Diabetes Sister     amputation  . Hypertension Mother   .  Stroke Father     Social History   Social History  . Marital Status: Widowed    Spouse Name: N/A  . Number of Children: 0  . Years of Education: N/A   Occupational History  . retired     [employer]ishing [employer] [place] History Main Topics  . Smoking status: Never Smoker   . Smokeless tobacco: Never Used  . Alcohol Use: No  . Drug Use: No  . Sexual Activity: Not on file   Other Topics Concern  . Not on file   Social History Narrative   Widowed.  Final sib died in 23-Mar-2013   1 step son out of state   Lives alone, drives as of [phone]     Constitutional: Denies fever, malaise, fatigue, or abrupt weight changes.  HEENT: Pt reports facial pain. Denies eye pain, eye redness, ear pain, ringing in the ears, wax buildup, runny nose, nasal congestion, bloody nose, or sore throat.  Respiratory: Denies difficulty breathing, shortness of  breath, cough or sputum production.   Cardiovascular: Denies chest pain, chest tightness, palpitations or swelling in the hands or feet.   No other specific complaints in a complete review of systems (except as listed in HPI above).     Objective:   Physical Exam BP 120/74 mmHg  Pulse 85  Temp(Src) 98.1 F (36.7 C) (Oral)  Wt 117 lb (53.071 kg)  SpO2 97%  LMP 03/28/1989 Wt Readings from Last 3 Encounters:  02/29/16 117 lb (53.071 kg)  09/17/15 120 lb 12 oz (54.772 kg)  12/09/14 124 lb 8 oz (56.473 kg)    General: Appears her stated age, in NAD. HEENT: Head: normal shape and size, pain with palpation of the right maxillary sinus; Eyes: sclera white, no icterus, and conjunctiva pink; Ears: Tm's gray and intact, normal light reflex; Nose: mucosa pink and moist, septum midline; Throat/Mouth: Teeth missing, mucosa pink and moist, no exudate, lesions or ulcerations noted. No significant swelling noted.  Neck:  No adenopathy noted.  Cardiovascular: Normal rate and rhythm. S1,S2 noted.  No murmur, rubs or gallops noted. Pulmonary/Chest: Normal effort and positive vesicular breath sounds. No respiratory distress. No wheezes, rales or ronchi noted.   BMET    Component Value Date/Time   NA 141 09/13/2015 0914   K 4.6 09/13/2015 0914   CL 102 09/13/2015 0914   CO2 31 09/13/2015 0914   GLUCOSE 96 09/13/2015 0914   BUN 10 09/13/2015 0914   CREATININE 0.94 09/13/2015 0914   CALCIUM 9.4 09/13/2015 0914   GFRNONAA 43* 02/02/2013 1655   GFRAA 50* 02/02/2013 1655    Lipid Panel     Component Value Date/Time   CHOL 215* 09/13/2015 0914   TRIG 154.0* 09/13/2015 0914   HDL 52.00 09/13/2015 0914   CHOLHDL 4 09/13/2015 0914   VLDL 30.8 09/13/2015 0914   LDLCALC 132* 09/13/2015 0914    CBC    Component Value Date/Time   WBC 13.5* 02/02/2013 1655   RBC 4.14 02/02/2013 1655   HGB 13.0 02/02/2013 1655   HCT 36.2 02/02/2013 1655   PLT 195 02/02/2013 1655   MCV 87.4 02/02/2013 1655    MCH 31.4 02/02/2013 1655   MCHC 35.9 02/02/2013 1655   RDW 12.8 02/02/2013 1655   LYMPHSABS 2.6 02/02/2013 1655   MONOABS 1.2* 02/02/2013 1655   EOSABS 0.1 02/02/2013 1655   BASOSABS 0.0 02/02/2013 1655         Assessment & Plan:   Right sided  facial pain  Flonase x 3 days for inflammation Cool compress as needed  RTC if sxs worsen or are not improved Dyland Panuco, NP

## 2016-02-29 NOTE — Telephone Encounter (Signed)
Patient advised and states she has an appt today and will pick them up at that time.

## 2016-05-26 ENCOUNTER — Other Ambulatory Visit: Payer: Self-pay | Admitting: *Deleted

## 2016-05-26 NOTE — Telephone Encounter (Signed)
Faxed refill request. Last Filled:   Lorazepam  40 tablet 2 02/28/2016  Last Filled:   Hydrocodone 180 tablet 0 02/28/2016  Last office visit:   02/29/16  CPE scheduled in the Fall.  Please advise.

## 2016-05-28 MED ORDER — HYDROCODONE-ACETAMINOPHEN 5-325 MG PO TABS: ORAL_TABLET | ORAL | Status: DC

## 2016-05-28 MED ORDER — LORAZEPAM 0.5 MG PO TABS: ORAL_TABLET | ORAL | Status: DC

## 2016-05-28 NOTE — Telephone Encounter (Signed)
Both printed. Thanks.  

## 2016-05-29 ENCOUNTER — Telehealth: Payer: Self-pay

## 2016-05-29 NOTE — Telephone Encounter (Signed)
Pt left v/m requesting status of synthroid; I spoke with Bea at [facility] has faxed PA for Synthroid.Please advise.

## 2016-05-29 NOTE — Telephone Encounter (Signed)
Patient advised.  Rx left at front desk for pick up. 

## 2016-05-29 NOTE — Telephone Encounter (Signed)
PA submitted thru [facility], awaiting response. 

## 2016-05-29 NOTE — Telephone Encounter (Signed)
Will work on it when I have time.

## 2016-06-01 NOTE — Telephone Encounter (Signed)
PA approved by letter.  Pharmacy advised and letter scanned.

## 2016-07-11 DIAGNOSIS — L570 Actinic keratosis: Secondary | ICD-10-CM | POA: Diagnosis not present

## 2016-07-11 DIAGNOSIS — Z08 Encounter for follow-up examination after completed treatment for malignant neoplasm: Secondary | ICD-10-CM | POA: Diagnosis not present

## 2016-07-11 DIAGNOSIS — L821 Other seborrheic keratosis: Secondary | ICD-10-CM | POA: Diagnosis not present

## 2016-07-11 DIAGNOSIS — X32XXXA Exposure to sunlight, initial encounter: Secondary | ICD-10-CM | POA: Diagnosis not present

## 2016-07-11 DIAGNOSIS — Z85828 Personal history of other malignant neoplasm of skin: Secondary | ICD-10-CM | POA: Diagnosis not present

## 2016-08-07 ENCOUNTER — Encounter: Payer: Self-pay | Admitting: Family Medicine

## 2016-08-07 ENCOUNTER — Ambulatory Visit (INDEPENDENT_AMBULATORY_CARE_PROVIDER_SITE_OTHER): Payer: Medicare Other | Admitting: Family Medicine

## 2016-08-07 DIAGNOSIS — T148 Other injury of unspecified body region: Secondary | ICD-10-CM

## 2016-08-07 DIAGNOSIS — W57XXXA Bitten or stung by nonvenomous insect and other nonvenomous arthropods, initial encounter: Secondary | ICD-10-CM

## 2016-08-07 MED ORDER — DOXYCYCLINE HYCLATE 100 MG PO TABS: 100.0000 mg | ORAL_TABLET | Freq: Two times a day (BID) | ORAL | 0 refills | Status: DC

## 2016-08-07 NOTE — Progress Notes (Signed)
Pre visit review using our clinic review tool, if applicable. No additional management support is needed unless otherwise documented below in the visit note. 

## 2016-08-07 NOTE — Progress Notes (Signed)
Tried hydrocortisone on L leg with presumed tick bite last week.  She was able to take the presumed tick off but then couldn't find it o/w.  She has local irritation and burning.  No fevers.  No other rash other than local irritation.   Meds, vitals, and allergies reviewed.   ROS: Per HPI unless specifically indicated in ROS section   nad ncat rrr ctab Neck w/o LA 1cm x 1cm L medial shin, less red today than prev per patient report.  No other rash

## 2016-08-07 NOTE — Patient Instructions (Signed)
If you have more aches, a fever, or other rashes, then start the doxycycline.   Update me as needed.   Take care.  Glad to see you.

## 2016-08-08 DIAGNOSIS — W57XXXA Bitten or stung by nonvenomous insect and other nonvenomous arthropods, initial encounter: Secondary | ICD-10-CM | POA: Insufficient documentation

## 2016-08-08 NOTE — Assessment & Plan Note (Signed)
Previously removed. No retained foreign body seen. She has minimal local skin reaction at the tick bite site. This looks more to be inflammation than infection. The redness is already getting lighter per patient report. She has no other rashes. She is not systemically ill. At this point risk of antibiotics would likely be higher than the benefit. Discussed with patient. At this point hold antibiotics. If she develops systemic illness, fever, other rashes then start doxycycline. She agrees. Update me as needed.

## 2016-08-14 ENCOUNTER — Telehealth: Payer: Self-pay

## 2016-08-14 NOTE — Telephone Encounter (Signed)
Pt left v/m; pt was seen 08/07/16; on 08/12/16 pt had numbness and swelling on foot on side of tick bite; pt got abx filled on 08/12/16 and foot is better but pt thought if she started the abx she was to notify Dr [name]. FYI to Dr [name].

## 2016-08-14 NOTE — Telephone Encounter (Signed)
Patient advised.

## 2016-08-14 NOTE — Telephone Encounter (Signed)
Noted.  Thanks.  If getting better, then continue as is.  If other changes then please let me know.  Thanks for the update.

## 2016-08-16 ENCOUNTER — Telehealth: Payer: Self-pay

## 2016-08-16 NOTE — Telephone Encounter (Signed)
Pt left v/m; pt was seen 08/07/16 and pt has been taking doxycycline and now pt has N&V after taking the med. Pt has been eating before taking the doxycycline. Pt wants to know what to do. Pt request cb.[place].

## 2016-08-16 NOTE — Telephone Encounter (Signed)
Patient notified as instructed by telephone and verbalized understanding. 

## 2016-08-16 NOTE — Telephone Encounter (Signed)
Skip 1 dose, restart med if possible. If needed, skipped 2 doses. Update [name] as needed. Thanks.

## 2016-08-21 NOTE — Telephone Encounter (Signed)
Since the swelling is resolved, I would stay off the medicine.  Added to intolerance list.  I hope she feels better soon.  Thanks for update.

## 2016-08-21 NOTE — Telephone Encounter (Signed)
Patient left a voicemail stating that she skipped a few doses of the doxycycline and tried it again and it is making her feel terrible. Patient stated that the swelling is gone and wants to know if she still has to try and take the medication?

## 2016-08-21 NOTE — Telephone Encounter (Signed)
Patient advised.

## 2016-08-21 NOTE — Addendum Note (Signed)
Addended by: [name] on: 08/21/2016 02:16 PM   Modules accepted: Orders

## 2016-08-28 ENCOUNTER — Other Ambulatory Visit: Payer: Self-pay | Admitting: Family Medicine

## 2016-08-28 NOTE — Telephone Encounter (Addendum)
Received refill electronically Last refill 02/27/16 #90/1 Last office visit 9/11/1acute See allergy/contrainidication  Last refill Hydrocodone 05/28/16 #180

## 2016-08-29 MED ORDER — HYDROCODONE-ACETAMINOPHEN 5-325 MG PO TABS: ORAL_TABLET | ORAL | 0 refills | Status: DC

## 2016-08-29 NOTE — Telephone Encounter (Signed)
Medication phoned to pharmacy. Patient advised. Rx left at front desk for pick up.  

## 2016-08-29 NOTE — Telephone Encounter (Signed)
Please call in librax, other rx printed.  Thanks.

## 2016-09-11 ENCOUNTER — Other Ambulatory Visit: Payer: Self-pay | Admitting: Family Medicine

## 2016-09-11 DIAGNOSIS — I10 Essential (primary) hypertension: Secondary | ICD-10-CM

## 2016-09-11 DIAGNOSIS — E559 Vitamin D deficiency, unspecified: Secondary | ICD-10-CM

## 2016-09-15 ENCOUNTER — Other Ambulatory Visit: Payer: Medicare Other

## 2016-09-19 ENCOUNTER — Encounter: Payer: Self-pay | Admitting: Family Medicine

## 2016-09-19 ENCOUNTER — Ambulatory Visit (INDEPENDENT_AMBULATORY_CARE_PROVIDER_SITE_OTHER): Payer: Medicare Other | Admitting: Family Medicine

## 2016-09-19 ENCOUNTER — Encounter: Payer: Self-pay | Admitting: Physician Assistant

## 2016-09-19 VITALS — BP 110/64 | HR 90 | Temp 97.5°F | Ht 62.0 in | Wt 117.5 lb

## 2016-09-19 DIAGNOSIS — K589 Irritable bowel syndrome without diarrhea: Secondary | ICD-10-CM

## 2016-09-19 DIAGNOSIS — M545 Low back pain: Secondary | ICD-10-CM

## 2016-09-19 DIAGNOSIS — K2270 Barrett's esophagus without dysplasia: Secondary | ICD-10-CM | POA: Diagnosis not present

## 2016-09-19 DIAGNOSIS — J0100 Acute maxillary sinusitis, unspecified: Secondary | ICD-10-CM | POA: Diagnosis not present

## 2016-09-19 DIAGNOSIS — I10 Essential (primary) hypertension: Secondary | ICD-10-CM

## 2016-09-19 DIAGNOSIS — R634 Abnormal weight loss: Secondary | ICD-10-CM

## 2016-09-19 DIAGNOSIS — E039 Hypothyroidism, unspecified: Secondary | ICD-10-CM

## 2016-09-19 DIAGNOSIS — R5383 Other fatigue: Secondary | ICD-10-CM

## 2016-09-19 DIAGNOSIS — E559 Vitamin D deficiency, unspecified: Secondary | ICD-10-CM

## 2016-09-19 DIAGNOSIS — Z0000 Encounter for general adult medical examination without abnormal findings: Secondary | ICD-10-CM

## 2016-09-19 LAB — COMPREHENSIVE METABOLIC PANEL
ALT: 11 U/L (ref 0–35)
AST: 22 U/L (ref 0–37)
Albumin: 4 g/dL (ref 3.5–5.2)
Alkaline Phosphatase: 72 U/L (ref 39–117)
BUN: 11 mg/dL (ref 6–23)
CO2: 31 mEq/L (ref 19–32)
Calcium: 9.1 mg/dL (ref 8.4–10.5)
Chloride: 100 mEq/L (ref 96–112)
Creatinine, Ser: 0.91 mg/dL (ref 0.40–1.20)
GFR: 62.58 mL/min (ref >60.00)
Glucose, Bld: 124 mg/dL — ABNORMAL HIGH (ref 70–99)
Potassium: 3.8 mEq/L (ref 3.5–5.1)
Sodium: 137 mEq/L (ref 135–145)
Total Bilirubin: 0.5 mg/dL (ref 0.2–1.2)
Total Protein: 7.3 g/dL (ref 6.0–8.3)

## 2016-09-19 LAB — CBC WITH DIFFERENTIAL/PLATELET
Basophils Absolute: 0 10*3/uL (ref 0.0–0.1)
Basophils Relative: 0.5 % (ref 0.0–3.0)
Eosinophils Absolute: 0.3 10*3/uL (ref 0.0–0.7)
Eosinophils Relative: 4.6 % (ref 0.0–5.0)
HCT: 35.2 % — ABNORMAL LOW (ref 36.0–46.0)
Hemoglobin: 12 g/dL (ref 12.0–15.0)
Lymphocytes Relative: 21.5 % (ref 12.0–46.0)
Lymphs Abs: 1.2 10*3/uL (ref 0.7–4.0)
MCHC: 34 g/dL (ref 30.0–36.0)
MCV: 89.1 fl (ref 78.0–100.0)
Monocytes Absolute: 0.4 10*3/uL (ref 0.1–1.0)
Monocytes Relative: 8.1 % (ref 3.0–12.0)
Neutro Abs: 3.5 10*3/uL (ref 1.4–7.7)
Neutrophils Relative %: 65.3 % (ref 43.0–77.0)
Platelets: 210 10*3/uL (ref 150.0–400.0)
RBC: 3.95 Mil/uL (ref 3.87–5.11)
RDW: 12.9 % (ref 11.5–15.5)
WBC: 5.4 10*3/uL (ref 4.0–10.5)

## 2016-09-19 LAB — LIPID PANEL
Cholesterol: 197 mg/dL (ref 0–200)
HDL: 52.5 mg/dL (ref >39.00)
LDL Cholesterol: 121 mg/dL — ABNORMAL HIGH (ref 0–99)
NonHDL: 144.59
Total CHOL/HDL Ratio: 4
Triglycerides: 120 mg/dL (ref 0.0–149.0)
VLDL: 24 mg/dL (ref 0.0–40.0)

## 2016-09-19 LAB — VITAMIN B12: Vitamin B-12: 239 pg/mL (ref 211–911)

## 2016-09-19 LAB — TSH: TSH: 1.5 u[IU]/mL (ref 0.35–4.50)

## 2016-09-19 LAB — VITAMIN D 25 HYDROXY (VIT D DEFICIENCY, FRACTURES): VITD: 20.92 ng/mL — ABNORMAL LOW (ref 30.00–100.00)

## 2016-09-19 MED ORDER — MIRTAZAPINE 15 MG PO TABS: ORAL_TABLET | ORAL | 3 refills | Status: DC

## 2016-09-19 MED ORDER — LISINOPRIL 10 MG PO TABS: 10.0000 mg | ORAL_TABLET | Freq: Every evening | ORAL | 3 refills | Status: DC

## 2016-09-19 MED ORDER — OMEPRAZOLE 40 MG PO CPDR: DELAYED_RELEASE_CAPSULE | ORAL | 3 refills | Status: DC

## 2016-09-19 MED ORDER — AZITHROMYCIN 250 MG PO TABS: ORAL_TABLET | ORAL | 0 refills | Status: DC

## 2016-09-19 MED ORDER — LORAZEPAM 0.5 MG PO TABS: ORAL_TABLET | ORAL | 2 refills | Status: DC

## 2016-09-19 NOTE — Progress Notes (Signed)
I have personally reviewed the Medicare Annual Wellness questionnaire and have noted 1. The patient's medical and social history 2. Their use of alcohol, tobacco or illicit drugs 3. Their current medications and supplements 4. The patient's functional ability including ADL's, fall risks, home safety risks and hearing or visual             impairment. 5. Diet and physical activities 6. Evidence for depression or mood disorders  The patients weight, height, BMI have been recorded in the chart and visual acuity is per eye clinic.  I have made referrals, counseling and provided education to the patient based review of the above and I have provided the pt with a written personalized care plan for preventive services.  Provider list updated- see scanned forms.  Routine anticipatory guidance given to patient.  See health maintenance.  Flu deferred for now, due to illness.   Shingles 2011 PNA deferred for now, given illness.  tetanus 2013 Colonoscopy not due given age >35. D/w pt.  She does well with her current meds.  BM daily.  She wanted to avoid other procedures now.  She isn't passing blood.   Breast cancer screening- 10/2014. Encouraged f/u when possible.  DXA not due, d/w pt.  She declined treatment prv.  Advance directive- sister in law Carrington Clamp designated if patient were incapacitated.  Cognitive function addressed- see scanned forms- and if abnormal then additional documentation follows.   IBS. Insurance wouldn't cover librax prev, d/w pt. Had done well on it. She continues on med.  no ADE on med.   Hypothyroid. Compliant with replacement. No ADE. TSH pending. No neck mass but some irritation with swallowing.  She can some troubles with pills sticking.  H/o thyroid cancer noted, distant past.  H/o GERD noted.  She does worse when she has a ST and she recently had a URI.  This is a chronic intermittent issue for her.    Hypertension: Using medication without problems or  lightheadedness: yes Chest pain with exertion:no Edema:no Labs pending.   H/o Barretts on PPI, generally doing well usually, see above re: occ throat irritation. No ADE. No GERD sx usually.    Chronic back pain. H/o hip fracture. Takes current meds w/o ADE. Doing well, functional with current meds.  "If it wasn't for the medicine (hydrocodone), I wouldn't be able to get around and do anything."  URI sx started about 5 days ago.  Rhinorrhea.  some cough.  ST.  Pain with swallowing.  No fevers.  Some R and L ear pain.  Some chills.  She feels run down in general.  Weight loss prev noted but stable over the last few months.    PMH and SH reviewed  Meds, vitals, and allergies reviewed.   ROS: Per HPI.  Unless specifically indicated otherwise in HPI, the patient denies:  General: fever. Eyes: acute vision changes ENT: sore throat Cardiovascular: chest pain Respiratory: SOB GI: vomiting GU: dysuria Musculoskeletal: acute back pain Derm: acute rash Neuro: acute motor dysfunction Psych: worsening mood Endocrine: polydipsia Heme: bleeding Allergy: hayfever  GEN: nad, alert and oriented HEENT: mucous membranes moist, tm w/o erythema, nasal exam w/o erythema, clear discharge noted,  OP with cobblestoning, R max sinus ttp NECK: supple w/o LA, no tmg, no masses noted CV: rrr.   PULM: ctab, no inc wob EXT: no edema SKIN: no acute rash

## 2016-09-19 NOTE — Progress Notes (Signed)
Pre visit review using our clinic review tool, if applicable. No additional management support is needed unless otherwise documented below in the visit note. 

## 2016-09-19 NOTE — Patient Instructions (Signed)
Go to the lab on the way out.  We'll contact you with your lab report. Since you have had more trouble swallowing, then I want you to see the GI clinic again.   Rosaria Ferries will call about your referral. Start zithromax in the meantime and that should help with the sinus infection.  Take care.  Glad to see you.  Update me as needed.

## 2016-09-20 DIAGNOSIS — J0100 Acute maxillary sinusitis, unspecified: Secondary | ICD-10-CM | POA: Insufficient documentation

## 2016-09-20 DIAGNOSIS — R634 Abnormal weight loss: Secondary | ICD-10-CM | POA: Insufficient documentation

## 2016-09-20 NOTE — Assessment & Plan Note (Signed)
Continue PPI, I am concerned more about GERD and Barrett's causing her throat irritation (along with recent URI/sinusitis) and would like GI input.  Referral placed.  We had held off on referral back to GI prev as she was doing well and given her age wanted to avoid procedures if possible.

## 2016-09-20 NOTE — Assessment & Plan Note (Signed)
Previously noted, but stabilized recently. Also with some fatigue. Unclear how much of this is related to recent illness with sinusitis. See notes on labs. She agrees.

## 2016-09-20 NOTE — Assessment & Plan Note (Signed)
Nontoxic, start azithro, fu prn.  She agrees.

## 2016-09-20 NOTE — Assessment & Plan Note (Signed)
Recheck TSH pending, unclear if this is related to her fatigue.  See notes on labs.  I don't feel a neck mass or lump.  No LA.

## 2016-09-20 NOTE — Assessment & Plan Note (Signed)
Continue current meds.  No ADE on med.  + relief of pain with med.

## 2016-09-20 NOTE — Assessment & Plan Note (Signed)
Controlled, continue as is.  Labs pending.

## 2016-09-20 NOTE — Assessment & Plan Note (Signed)
Flu deferred for now, due to illness.   Shingles 2011 PNA deferred for now, given illness.  tetanus 2013 Colonoscopy not due given age >8. D/w pt.  She does well with her current meds.  BM daily.  She wanted to avoid other procedures now.  She isn't passing blood.   Breast cancer screening- 10/2014. Encouraged f/u when possible.  DXA not due, d/w pt.  She declined treatment prv.  Advance directive- sister in law Carrington Clamp designated if patient were incapacitated.  Cognitive function addressed- see scanned forms- and if abnormal then additional documentation follows.

## 2016-09-20 NOTE — Assessment & Plan Note (Signed)
Controlled, continue as is.  

## 2016-09-21 ENCOUNTER — Other Ambulatory Visit: Payer: Self-pay | Admitting: Family Medicine

## 2016-09-22 ENCOUNTER — Other Ambulatory Visit: Payer: Self-pay | Admitting: Family Medicine

## 2016-09-22 DIAGNOSIS — R739 Hyperglycemia, unspecified: Secondary | ICD-10-CM

## 2016-09-25 ENCOUNTER — Telehealth: Payer: Self-pay | Admitting: *Deleted

## 2016-09-25 NOTE — Telephone Encounter (Signed)
Patient says she has finished the ABX and understands that it stays in her system for another few days but she says her throat still feels very congested and tight and she isn't able to get anything up, no fever.  Advised patient that I would inform Dr. [name] and would call if he has any further instructions.

## 2016-09-25 NOTE — Telephone Encounter (Signed)
In the absence of fever and unless she is clearly getting worse in the meantime, then I would give this a few more days and continue symptomatic tx, ie gargling with salt water for her throat, etc.  Thanks.  If worse, then I want to see her back in the office.

## 2016-09-25 NOTE — Telephone Encounter (Signed)
Patient advised.

## 2016-09-29 ENCOUNTER — Ambulatory Visit (INDEPENDENT_AMBULATORY_CARE_PROVIDER_SITE_OTHER): Payer: Medicare Other | Admitting: Physician Assistant

## 2016-09-29 ENCOUNTER — Encounter: Payer: Self-pay | Admitting: Physician Assistant

## 2016-09-29 VITALS — BP 148/80 | HR 88 | Ht 62.21 in | Wt 116.2 lb

## 2016-09-29 DIAGNOSIS — R070 Pain in throat: Secondary | ICD-10-CM

## 2016-09-29 DIAGNOSIS — R1310 Dysphagia, unspecified: Secondary | ICD-10-CM

## 2016-09-29 MED ORDER — NYSTATIN 100000 UNIT/ML MT SUSP: OROMUCOSAL | 0 refills | Status: DC

## 2016-09-29 MED ORDER — NYSTATIN 100000 UNIT/ML MT SUSP: 5.0000 mL | Freq: Four times a day (QID) | OROMUCOSAL | 0 refills | Status: DC

## 2016-09-29 NOTE — Patient Instructions (Addendum)
We sent a prescription to [facility] for Nystatin oral suspension. . Take for 14 days.  You will be getting a call from [facility]. We made a referral for an appointment. [place].  Their number is [phone].

## 2016-09-29 NOTE — Progress Notes (Signed)
Agree with assessment and plan. She does have history of Barrett's esophagus but appears to be very short segment on review of endoscopy in 2012. She would be due for surveillance EGD at this time, but we can await barium study and her course first, and see if she wishes to have this done moving forward.

## 2016-09-29 NOTE — Progress Notes (Signed)
Subjective:    Patient ID: [name], female    DOB: [birth year], 82 y.o.   MRN: [id number]  HPI [name] is a pleasant 82 year old white female known previously to Dr. Sharlett Iles who is referred today by Dr. Elsie Stain for evaluation of dysphagia and "sore throat". Patient had prior colonoscopy in 2012 with finding of severe left-sided diverticulosis, no polyps and had EGD in 2012 as well finding of Barrett's esophagus no dysplasia and chronic atrophic gastric mucosa as well as small hiatal hernia. Again biopsies showed chronic gastritis H. pylori negative peptic duodenitis and Barrett's with no dysplasia. She comes in today stating that she started having problems with a persistent sore throat in September 2017. She complains of and irritated raw tight feeling in her throat. She is status post remote thyroidectomy for thyroid cancer and subsequent iodine treatment. She has been treated with 2 courses of antibiotics by primary care the last was a Z-Pak. She says the antibiotics helped her symptoms a little bit but did not resolve the sore throat and off of antibiotics this symptoms seem to worsen again. She's also been having some coarseness. She describes a discomfort and raw feeling on the left side of her pharynx. She says she gets a "scraping" feeling with larger bites of food. She's really not having any esophageal type complaints of solid or liquid food dysphagia or odynophagia. She does have GERD and remains on omeprazole daily.  Review of Systems Pertinent positive and negative review of systems were noted in the above HPI section.  All other review of systems was otherwise negative.  Outpatient Encounter Prescriptions as of 09/29/2016  Medication Sig  . azithromycin (ZITHROMAX) 250 MG tablet 2 tabs a day for 1 day and then 1 a day for 4 days.  . B Complex Vitamins (B COMPLEX-B12 PO) Take by mouth. Taken daily  . Cholecalciferol ([employer] VITAMIN D3) 1000 UNITS CHEW Chew 1 tablet  (1,000 Units total) by mouth daily.  . clidinium-chlordiazePOXIDE ([name]) 5-2.5 MG capsule TAKE 1 CAPSULE BY MOUTH ONCE DAILY  . HYDROcodone-acetaminophen (NORCO/VICODIN) 5-325 MG tablet TAKE 1/2 TO 1 TABLET BY MOUTH EVERY 6 HOURS AS NEEDED FOR PAIN.  Marland Kitchen lisinopril (PRINIVIL,ZESTRIL) 10 MG tablet Take 1 tablet (10 mg total) by mouth Nightly.  . loratadine ([employer]S ALLERGY RELIEF) 10 MG dissolvable tablet Take 10 mg by mouth daily as needed for allergies.   Marland Kitchen LORazepam (ATIVAN) 0.5 MG tablet TAKE 1/2 TO 1 TABLET BY MOUTH 2 TIMES DAILY AS NEEDED FOR ANXIETY  . mirtazapine (REMERON) 15 MG tablet TAKE 1 TABLET BY MOUTH EVERY NIGHT AT BEDTIME  . Multiple Vitamin (MULTIVITAMIN PO) Take by mouth daily.    Marland Kitchen omeprazole (PRILOSEC) 40 MG capsule One tab by mouth 22min before breakfast  . polyethylene glycol powder (MIRALAX) powder Take 17 g by mouth every evening.   Marland Kitchen SYNTHROID 88 MCG tablet TAKE 1 TABLET BY MOUTH DAILY  . nystatin (MYCOSTATIN) 100000 UNIT/ML suspension Take 5 ml by mouth, swish and swallow, 4 times daily after meals.  . [DISCONTINUED] nystatin (MYCOSTATIN) 100000 UNIT/ML suspension Take 5 mLs (500,000 Units total) by mouth 4 (four) times daily.   No facility-administered encounter medications on file as of 09/29/2016.    Allergies  Allergen Reactions  . Bentyl [Dicyclomine Hcl] Other (See Comments)    abd pain  . Ciprofloxacin     Can cause GI upset, esp if taken with metamucil, o/w it is tolerated  . Cortisone Acetate     REACTION:  nausea  . Doxycycline Other (See Comments)    GI upset  . Gabapentin     REACTION: severe dizziness  . Metoclopramide Hcl   . Penicillins     syncope  . Promethazine Hcl     REACTION: unspecified   Patient Active Problem List   Diagnosis Date Noted  . Acute non-recurrent maxillary sinusitis 09/20/2016  . Loss of weight 09/20/2016  . Tick bite 08/08/2016  . Insomnia 03/10/2015  . Osteoporosis 11/01/2014  . Medicare annual wellness visit,  subsequent 08/25/2014  . Advance care planning 08/25/2014  . Anxiety state 10/23/2012  . IBS (irritable bowel syndrome) 04/18/2011  . GERD (gastroesophageal reflux disease) 04/18/2011  . Barrett esophagus 04/18/2011  . MULTIPLE CRANIAL NERVE PALSIES 01/29/2009  . CONVERGENCE INSUFF/PALSY BINOCULAR EYE MOVEMENT 01/22/2009  . KERATOACANTHOMA 11/18/2007  . Backache 05/15/2007  . Hypothyroidism 02/15/2007  . HYPERLIPIDEMIA 02/15/2007  . Essential hypertension 02/15/2007  . MITRAL VALVE PROLAPSE 02/15/2007  . ATRIAL FIBRILLATION, PAROXYSMAL 02/15/2007  . DIVERTICULOSIS, COLON 02/15/2007  . POSTMENOPAUSAL STATUS 02/15/2007  . COLONIC POLYPS, HX OF 02/15/2007  . DISORDER, DEPRESSIVE NEC 05/27/1998  . THYROID CANCER, HX OF 02/25/1994   Social History   Social History  . Marital status: Widowed    Spouse name: N/A  . Number of children: 0  . Years of education: N/A   Occupational History  . retired     [employer] [place] History Main Topics  . Smoking status: Never Smoker  . Smokeless tobacco: Never Used  . Alcohol use No  . Drug use: No  . Sexual activity: Not on file   Other Topics Concern  . Not on file   Social History Narrative   Widowed.  Final sib died in 03-09-13   1 step son out of state   Lives alone, drives as of [id number]    Ms. Einspahr's family history includes Cancer in her brother; Diabetes in her brother and sister; Heart disease in her brother; Hypertension in her mother; Stroke in her father.      Objective:    Vitals:   09/29/16 0841  BP: (!) 148/80  Pulse: 88    Physical Exam  well-developed elderly white female in no acute distress, pleasant Blood pressure 148/80 pulse 88, height 5 foot 2 weight 116 BMI 21.1. HEENT; nontraumatic normocephalic EOMI PERRLA sclera anicteric,  oropharynx no obvious thrush or other lesion, she is tender in the left submandibular area and there is a tender lymph node, Status post previous thyroidectomy,  Cardiovascular ;regular rate and rhythm with S1-S2 no murmur or gallop, Pulmonary; clear bilaterally, Abdomen; soft nontender nondistended bowel sounds are active there is no palpable mass or hepatosplenomegaly, Extremities; no clubbing cyanosis or edema skin warm and dry, Neuropsych; mood and affect appropriate       Assessment & Plan:   #88 82 year old white female with 2 month history of "sore throat" with discomfort on the left side of the pharynx with swallowing. She also has a swollen lymph node in the left submandibular area I do not think that her complaints are of esophageal origin rather pharyngeal or laryngeal. #2 history of Barrett's esophagus-asked EGD 03/10/11 no dysplasia small hiatal hernia and no stricture #3 remote history of thyroid CA status post thyroidectomy and iodine treatment #4 diverticulosis  Plan; Will schedule for barium swallow We'll arrange for ENT consultation at [facility] your nose and throat Continue omeprazole 40 mg by mouth every morning Trial of Mycostatin oral suspension  5 mL by mouth 4 times daily swish and swallow 2 weeks. (No obvious thrush on exam, but has had ongoing symptoms post 2 recent courses of antibiotics ) Patient will be established with Dr. Nicki Guadalajara   Catera Hankins S Marquell Saenz PA-C 09/29/2016   Cc: [name], MD

## 2016-10-02 ENCOUNTER — Telehealth: Payer: Self-pay | Admitting: *Deleted

## 2016-10-02 NOTE — Telephone Encounter (Signed)
Called to advise the patient of her upcoming appointment with Dr. Izora Gala on 10-12-2016 . She is to arrive at 1:35 PM.  She can bring her insurance cards, copay and list of medications.  I gave her their address of [place].

## 2016-10-12 DIAGNOSIS — J3800 Paralysis of vocal cords and larynx, unspecified: Secondary | ICD-10-CM | POA: Insufficient documentation

## 2016-10-12 DIAGNOSIS — K219 Gastro-esophageal reflux disease without esophagitis: Secondary | ICD-10-CM | POA: Diagnosis not present

## 2016-11-02 ENCOUNTER — Ambulatory Visit (INDEPENDENT_AMBULATORY_CARE_PROVIDER_SITE_OTHER): Payer: Medicare Other | Admitting: Family Medicine

## 2016-11-02 ENCOUNTER — Encounter: Payer: Self-pay | Admitting: Family Medicine

## 2016-11-02 VITALS — BP 142/76 | HR 100 | Temp 97.9°F | Wt 116.5 lb

## 2016-11-02 DIAGNOSIS — Z23 Encounter for immunization: Secondary | ICD-10-CM | POA: Diagnosis not present

## 2016-11-02 DIAGNOSIS — R070 Pain in throat: Secondary | ICD-10-CM

## 2016-11-02 NOTE — Progress Notes (Signed)
Pre visit review using our clinic review tool, if applicable. No additional management support is needed unless otherwise documented below in the visit note. 

## 2016-11-03 ENCOUNTER — Telehealth: Payer: Self-pay | Admitting: Family Medicine

## 2016-11-03 DIAGNOSIS — R070 Pain in throat: Secondary | ICD-10-CM | POA: Insufficient documentation

## 2016-11-03 NOTE — Telephone Encounter (Signed)
Call patient. I was able to review old notes. It does look like that she has abnormal movement on one of her vocal cords. This is likely an old finding. It was noted on exam by Dr. Constance Holster. This likely doesn't have anything to do with her current symptoms, but it could potentially affect her voice some. She is likely compensated for in the meantime.  He thought that most of her symptoms were related to reflux. It may be that reflux in combination with URI symptoms causes her to have the troubles that she was describing. She is better now. I would still continue with the reflux recommendations, limiting caffeine. Continue Prilosec. Upon review of the records, I don't think anything else needs to be done at this point. I'm going to rule out this back to GI is to make sure they think she does not need a barium swallow. It was mentioned in the note from GI and I will get them to clarify that. Thanks.

## 2016-11-03 NOTE — Progress Notes (Signed)
Follow-up for throat symptoms. She was seen by GI, she was then seen by ENT. The EMR was down at the time for office visit and I could not pull up all of the notes at the time. Discussed with patient. She clearly has more symptoms when she has URI symptoms. Her previous throat pain had resolved. She does drink several cups of coffee a day. She has cut back on soda in the meantime. Overall she is feeling better. No fevers chills or diarrhea.  Meds, vitals, and allergies reviewed.   ROS: Per HPI unless specifically indicated in ROS section   nad ncat Nasal exam wnl OP wnl, no erythema Neck supple, no LA, not ttp rrr ctab

## 2016-11-03 NOTE — Assessment & Plan Note (Addendum)
I was able to review records again after the office visit. Per ENT exam, she has a paretic vocal cord. ENT thought she was more likely to have reflux related symptoms. See following phone note.  She is clearly better in the meantime and she does not have pain at this point.

## 2016-11-03 NOTE — Telephone Encounter (Signed)
Patient advised.   Please let her know if she needs the barium swallow.

## 2016-11-06 NOTE — Telephone Encounter (Signed)
Please call pt- see if having any further sore throat sxs, or reflux- Dr Phillip Heal forwarded a note to me  Regarding follow up- I dont think she needs a barium swallow if better ,  She does have hx of barretts and probably needs EGD to follow up barretts - she is an [facility] pt now

## 2016-11-07 NOTE — Telephone Encounter (Signed)
Patient denies any sore throat. She admits to reflux. She will consider the EGD.

## 2016-11-22 ENCOUNTER — Other Ambulatory Visit: Payer: Self-pay | Admitting: Family Medicine

## 2016-11-22 NOTE — Telephone Encounter (Signed)
Last filled 08-29-16 #180 Last OV was a [facility] 09-19-16 No Future OV  Forwarding to Dr Danise Mina in Dr Josefine Class absence

## 2016-11-23 NOTE — Telephone Encounter (Signed)
Printed.  Thanks.  

## 2016-11-23 NOTE — Telephone Encounter (Signed)
Patient came into office to ask about Rx.  Rx left at front desk for pick up.

## 2016-11-23 NOTE — Telephone Encounter (Signed)
to PCP

## 2016-11-24 ENCOUNTER — Telehealth: Payer: Self-pay | Admitting: Family Medicine

## 2016-11-24 NOTE — Telephone Encounter (Signed)
Pt called to see if form for her meds are ready to pick. You can leave message on answering  machine

## 2016-11-24 NOTE — Telephone Encounter (Signed)
Patient advised.

## 2016-12-18 ENCOUNTER — Telehealth: Payer: Self-pay

## 2016-12-18 NOTE — Telephone Encounter (Signed)
Pt request cb; pt was sent to Dr [name] and another doctor but pt does not remember the doctors name. Pt saw Dr [name] and he pushed on pts throat, asked questions and then pt got a cb 1 1/2 weeks later and pt needs to have further testing. Pt does not know who called or what kind of testing. Pt wants to know what Dr [name] thinks about her having additional testing. Pt could not give me more info than this. Pt last saw Dr [name] 11/02/16. Pt said any foods with rough edges like cereal will cause throat problem.[place]. Pt request cb.

## 2016-12-18 NOTE — Telephone Encounter (Signed)
Per prev notes, she does have hx of barretts and probably needs EGD to follow up barretts, per GI.  I'll defer to patient and GI.  Thanks.

## 2016-12-19 NOTE — Telephone Encounter (Signed)
Thanks.  See below.  

## 2016-12-19 NOTE — Telephone Encounter (Signed)
Patient notified as instructed by telephone. Patient stated that she was confused and thought the person that called her about the EGD was going to call her back, but she has not heard anything from them. Gave patient the name of the nurse that called her from GI and she is to calll her back to discuss possibly having the EGD.  After reviewing note please forward to the GI provider that last saw patient.

## 2016-12-21 NOTE — Telephone Encounter (Signed)
Please get pt an appt with  Dr Havery Moros - first available is fine

## 2016-12-22 NOTE — Telephone Encounter (Signed)
First available office visit is 01/23/17 at 9:45 am. Will call the patient.

## 2016-12-26 NOTE — Telephone Encounter (Signed)
patient is scheduled for 01/25/17 for 10:30.  She is unable to make the 2/27 appt

## 2017-01-09 ENCOUNTER — Encounter: Payer: Self-pay | Admitting: Emergency Medicine

## 2017-01-09 ENCOUNTER — Emergency Department: Payer: Medicare Other

## 2017-01-09 ENCOUNTER — Emergency Department
Admission: EM | Admit: 2017-01-09 | Discharge: 2017-01-09 | Disposition: A | Discharge status: Home / Self Care | Payer: Medicare Other | Attending: Emergency Medicine | Admitting: Emergency Medicine

## 2017-01-09 DIAGNOSIS — S7002XA Contusion of left hip, initial encounter: Secondary | ICD-10-CM | POA: Diagnosis not present

## 2017-01-09 DIAGNOSIS — S40011A Contusion of right shoulder, initial encounter: Secondary | ICD-10-CM | POA: Diagnosis not present

## 2017-01-09 DIAGNOSIS — Y999 Unspecified external cause status: Secondary | ICD-10-CM | POA: Diagnosis not present

## 2017-01-09 DIAGNOSIS — S51811A Laceration without foreign body of right forearm, initial encounter: Secondary | ICD-10-CM | POA: Insufficient documentation

## 2017-01-09 DIAGNOSIS — T148XXA Other injury of unspecified body region, initial encounter: Secondary | ICD-10-CM | POA: Diagnosis not present

## 2017-01-09 DIAGNOSIS — Z79899 Other long term (current) drug therapy: Secondary | ICD-10-CM | POA: Diagnosis not present

## 2017-01-09 DIAGNOSIS — Y929 Unspecified place or not applicable: Secondary | ICD-10-CM | POA: Insufficient documentation

## 2017-01-09 DIAGNOSIS — W1809XA Striking against other object with subsequent fall, initial encounter: Secondary | ICD-10-CM | POA: Diagnosis not present

## 2017-01-09 DIAGNOSIS — S79911A Unspecified injury of right hip, initial encounter: Secondary | ICD-10-CM | POA: Diagnosis not present

## 2017-01-09 DIAGNOSIS — S79912A Unspecified injury of left hip, initial encounter: Secondary | ICD-10-CM | POA: Diagnosis not present

## 2017-01-09 DIAGNOSIS — I10 Essential (primary) hypertension: Secondary | ICD-10-CM | POA: Insufficient documentation

## 2017-01-09 DIAGNOSIS — E039 Hypothyroidism, unspecified: Secondary | ICD-10-CM | POA: Insufficient documentation

## 2017-01-09 DIAGNOSIS — S7000XA Contusion of unspecified hip, initial encounter: Secondary | ICD-10-CM

## 2017-01-09 DIAGNOSIS — Y9301 Activity, walking, marching and hiking: Secondary | ICD-10-CM | POA: Insufficient documentation

## 2017-01-09 DIAGNOSIS — M25512 Pain in left shoulder: Secondary | ICD-10-CM | POA: Diagnosis not present

## 2017-01-09 DIAGNOSIS — R52 Pain, unspecified: Secondary | ICD-10-CM

## 2017-01-09 DIAGNOSIS — S4991XA Unspecified injury of right shoulder and upper arm, initial encounter: Secondary | ICD-10-CM | POA: Diagnosis present

## 2017-01-09 DIAGNOSIS — M25551 Pain in right hip: Secondary | ICD-10-CM | POA: Diagnosis not present

## 2017-01-09 DIAGNOSIS — M25552 Pain in left hip: Secondary | ICD-10-CM | POA: Diagnosis not present

## 2017-01-09 DIAGNOSIS — M25511 Pain in right shoulder: Secondary | ICD-10-CM | POA: Diagnosis not present

## 2017-01-09 MED ORDER — LORAZEPAM 2 MG/ML IJ SOLN
0.5000 mg | Freq: Once | INTRAMUSCULAR | Status: AC
  Administered 2017-01-09: 0.5 mg via INTRAVENOUS

## 2017-01-09 NOTE — ED Notes (Signed)
Wound cleaned. Xeroform applied and non adhesive dressing. Placed on bedpan.

## 2017-01-09 NOTE — ED Notes (Signed)
Pt returned from MRI °

## 2017-01-09 NOTE — ED Notes (Signed)
Family sitting at bedside. Pt remains in MRI

## 2017-01-09 NOTE — ED Notes (Signed)
Patient transported to X-ray 

## 2017-01-09 NOTE — ED Notes (Signed)
Pt c/o right hip pain when placing on bedpan. Right leg does not appear as shortened after repositioning.

## 2017-01-09 NOTE — ED Notes (Signed)
Patient transported to MRI 

## 2017-01-09 NOTE — Discharge Instructions (Signed)
Use the pain pills you already have. Be careful any pain pills can make you woozy and constipated. Follow-up with your regular doctor if need be. Return if any worse.

## 2017-01-09 NOTE — ED Triage Notes (Addendum)
Pt reports fall last night. Was walking and turned around at window too quick and lost balance. C/o right shoulder and left hip pain. Appears to have right leg shortening but no right hip pain. Pt ambulatory on scene for EMS

## 2017-01-09 NOTE — ED Notes (Signed)
Waiting to go for MRI  

## 2017-01-09 NOTE — ED Provider Notes (Signed)
[facility] Emergency Department Provider Note   ____________________________________________   First MD Initiated Contact with Patient 01/09/17 [id number]     (approximate)  I have reviewed the triage vital signs and the nursing notes.   HISTORY  Chief Complaint Fall    HPI [name] is a 83 y.o. female who reports yesterday about 6:00 she was by the window and turned too quickly lost her balance and fell. She denies hitting her head or neck. She denies any headache or neck pain. She does have pain in the right shoulder and the left hip. It was mild yesterday but is now fairly severe. She reports she's having trouble walking because it hurts so much. She also has a skin tear on the right forearm that is the size of a $0.50 piece.   Past Medical History:  Diagnosis Date  . Barrett esophagus   . Diplopia    history of,hospital 2/26-2/27/10  . Diverticulitis    colonoscopy 1995.  CT 01/31/09, 02/02/09  . Hiatal hernia   . Hiatal hernia    EGD 1998, 2007 ( Dr. Sharlett Iles)  . Hyperlipemia   . Hypertension   . Hypothyroidism   . Osteoporosis DXA 2015  . Personal history of colonic polyps    colonoscpy, removed 1995  . Thyroid carcinoma ([place]) 01/1994  . UTI (urinary tract infection)    Chronic    Patient Active Problem List   Diagnosis Date Noted  . Throat pain 11/03/2016  . Acute non-recurrent maxillary sinusitis 09/20/2016  . Loss of weight 09/20/2016  . Tick bite 08/08/2016  . Insomnia 03/10/2015  . Osteoporosis 11/01/2014  . Medicare annual wellness visit, subsequent 08/25/2014  . Advance care planning 08/25/2014  . Anxiety state 10/23/2012  . IBS (irritable bowel syndrome) 04/18/2011  . GERD (gastroesophageal reflux disease) 04/18/2011  . Barrett esophagus 04/18/2011  . MULTIPLE CRANIAL NERVE PALSIES 01/29/2009  . CONVERGENCE INSUFF/PALSY BINOCULAR EYE MOVEMENT 01/22/2009  . KERATOACANTHOMA 11/18/2007  . Backache 05/15/2007  .  Hypothyroidism 02/15/2007  . HYPERLIPIDEMIA 02/15/2007  . Essential hypertension 02/15/2007  . MITRAL VALVE PROLAPSE 02/15/2007  . ATRIAL FIBRILLATION, PAROXYSMAL 02/15/2007  . DIVERTICULOSIS, COLON 02/15/2007  . POSTMENOPAUSAL STATUS 02/15/2007  . COLONIC POLYPS, HX OF 02/15/2007  . DISORDER, DEPRESSIVE NEC 05/27/1998  . THYROID CANCER, HX OF 02/25/1994    Past Surgical History:  Procedure Laterality Date  . ABDOMINAL HYSTERECTOMY    . CHOLECYSTECTOMY    . gyn surgery     hysterectomy  . HIP SURGERY  08/1993   due to fracture of right hip  . THYROIDECTOMY  04/1994   thyroid cancer    Prior to Admission medications   Medication Sig Start Date End Date Taking? Authorizing Provider  Artificial Saliva (BIOTENE DRY MOUTH MOISTURIZING) SOLN Use as directed in the mouth or throat.   Yes Historical Provider, MD  B Complex Vitamins (B COMPLEX-B12 PO) Take by mouth. Taken daily   Yes Historical Provider, MD  Cholecalciferol ([employer]S VITAMIN D3) 1000 UNITS CHEW Chew 1 tablet (1,000 Units total) by mouth daily. 01/31/15  Yes [name], MD  clidinium-chlordiazePOXIDE ([name]) 5-2.5 MG capsule TAKE 1 CAPSULE BY MOUTH ONCE DAILY 08/29/16  Yes [name], MD  HYDROcodone-acetaminophen (NORCO/VICODIN) 5-325 MG tablet TAKE ONE-HALF TO 1 TABLET BY MOUTH EVERY6 HOURS AS NEEDED FOR PAIN Patient taking differently: 1 tab bid 11/23/16  Yes [name], MD  lisinopril (PRINIVIL,ZESTRIL) 10 MG tablet Take 1 tablet (10 mg total) by mouth  Nightly. 09/19/16  Yes [name], MD  loratadine (CVS ALLERGY RELIEF) 10 MG dissolvable tablet Take 10 mg by mouth daily as needed for allergies.    Yes Historical Provider, MD  mirtazapine (REMERON) 15 MG tablet TAKE 1 TABLET BY MOUTH EVERY NIGHT AT BEDTIME 09/19/16  Yes [name], MD  Multiple Vitamin (MULTIVITAMIN PO) Take by mouth daily.     Yes Historical Provider, MD  omeprazole (PRILOSEC) 40 MG capsule One tab by mouth 10min before breakfast  09/19/16  Yes [name], MD  polyethylene glycol powder (MIRALAX) powder Take 17 g by mouth every evening.    Yes Historical Provider, MD  SYNTHROID 88 MCG tablet TAKE 1 TABLET BY MOUTH DAILY 09/21/16  Yes [name], MD  LORazepam (ATIVAN) 0.5 MG tablet TAKE 1/2 TO 1 TABLET BY MOUTH 2 TIMES DAILY AS NEEDED FOR ANXIETY 09/19/16   [name], MD  nystatin (MYCOSTATIN) 100000 UNIT/ML suspension Take 5 ml by mouth, swish and swallow, 4 times daily after meals. Patient not taking: Reported on 01/09/2017 09/29/16   Amy S Esterwood, PA-C    Allergies Bentyl [dicyclomine hcl]; Ciprofloxacin; Cortisone acetate; Doxycycline; Gabapentin; Metoclopramide hcl; Penicillins; and Promethazine hcl  Family History  Problem Relation Age of Onset  . Cancer Brother     Pharyngeal   . Heart disease Brother     MI  . Diabetes Brother   . Diabetes Sister     amputation  . Hypertension Mother   . Stroke Father     Social History Social History  Substance Use Topics  . Smoking status: Never Smoker  . Smokeless tobacco: Never Used  . Alcohol use No    Review of Systems Constitutional: No fever/chills Eyes: No visual changes. ENT: No sore throat. Cardiovascular: Denies chest pain. Respiratory: Denies shortness of breath. Gastrointestinal: No abdominal pain.  No nausea, no vomiting.  No diarrhea.  No constipation. Genitourinary: Negative for dysuria. Musculoskeletal: Negative for back pain. Skin: Negative for rash. Neurological: Negative for headaches, focal weakness or numbness.  10-point ROS otherwise negative.  ____________________________________________   PHYSICAL EXAM:  VITAL SIGNS: ED Triage Vitals  Enc Vitals Group     BP 01/09/17 0934 134/87     Pulse Rate 01/09/17 0934 85     Resp 01/09/17 0934 18     Temp 01/09/17 0934 97.5 F (36.4 C)     Temp Source 01/09/17 0934 Oral     SpO2 01/09/17 0934 97 %     Weight 01/09/17 0928 110 lb (49.9 kg)     Height 01/09/17  0928 5\' 2"  (1.575 m)     Head Circumference --      Peak Flow --      Pain Score 01/09/17 0929 7     Pain Loc --      Pain Edu? --      Excl. in [place]? --    Constitutional: Alert and oriented. Well appearing and in no acute distress. Eyes: Conjunctivae are normal. PERRL. EOMI. Head: Atraumatic. Nose: No congestion/rhinnorhea. Mouth/Throat: Mucous membranes are moist.  Oropharynx non-erythematous. Neck: No stridor.  No cervical spine tenderness to palpation. Cardiovascular: Normal rate, regular rhythm. Grossly normal heart sounds.  Good peripheral circulation. Respiratory: Normal respiratory effort.  No retractions. Lungs CTAB. Gastrointestinal: Soft and nontender. No distention. No abdominal bruits. No CVA tenderness. Musculoskeletal: Patient has normal capillary refill and sensation in his feet. She does have a bruise on the anterior right shoulder although there doesn't  appear to be a limitation range of motion. She also has some pain behind the left iliac crest is quite tender.. Neurologic:  Normal speech and language. No gross focal neurologic deficits are appreciated. No gait instability. Skin:  Skin is warm, dry and intact except for skin tear on the right forearm as described.. No rash noted.   ____________________________________________   LABS (all labs ordered are listed, but only abnormal results are displayed)  Labs Reviewed - No data to display ____________________________________________  EKG   ____________________________________________  RADIOLOGY  Study Result   CLINICAL DATA:  Fall.  Left hip pain.  EXAM: DG HIP (WITH OR WITHOUT PELVIS) 2-3V LEFT  COMPARISON:  None.  FINDINGS: Screws noted within the proximal right femur related to old injury. Mild symmetric degenerative changes in the hips bilaterally. No acute bony abnormality. Specifically, no fracture, subluxation, or dislocation. Soft tissues are intact.  IMPRESSION: No acute bony  abnormality.   Electronically Signed   By: Rolm Baptise M.D.   On: 01/09/2017 10:38    Study Result   CLINICAL DATA:  83 year old female post fall. Right shoulder pain. Initial encounter.  EXAM: RIGHT SHOULDER - 2+ VIEW  COMPARISON:  12/22/2013 and 02/02/2013 chest x-ray  FINDINGS: Appearance of impaction fracture lateral aspect right humeral head/greater tuberosity region of indeterminate age. This is not appreciated on prior chest x-rays. Question if the patient is tender here?  Otherwise no fracture or dislocation noted.  Visualized lungs clear.  IMPRESSION: Appearance of impaction fracture lateral aspect right humeral head/greater tuberosity region of indeterminate age. This is not appreciated on prior chest x-rays. Question if the patient is tender here?  Otherwise no fracture or dislocation noted.   Electronically Signed   By: Genia Del M.D.   On: 01/09/2017 10:43    Study Result   CLINICAL DATA:  Fall yesterday with hip pain, initial encounter  EXAM: DG HIP (WITH OR WITHOUT PELVIS) 2-3V RIGHT  COMPARISON:  None.  FINDINGS: Postsurgical changes are noted in the proximal right hip with evidence of previous fracture. No acute abnormality is noted.  IMPRESSION: Postsurgical changes without acute abnormality.   Electronically Signed   By: Inez Catalina M.D.   On: 01/09/2017 11:35    Study Result   CLINICAL DATA:  Patient status post fall 01/08/2017 with onset of left hip pain. History of prior right hip fracture.  EXAM: MRI PELVIS WITHOUT CONTRAST  TECHNIQUE: Multiplanar multisequence MR imaging of the pelvis was performed. No intravenous contrast was administered.  COMPARISON:  Plain films of the hips earlier today and CT abdomen and pelvis 02/02/2009.  FINDINGS: There is no acute bony or joint abnormality on the right or left. Both hips are located. Artifact from screws fixing a remote healed right  femoral neck fracture is noted. No evidence of avascular necrosis of the femoral heads. No worrisome marrow lesion. Convex left lumbar scoliosis and lower lumbar degenerative change are noted.  Mild bilateral hip degenerative change is seen. There is no joint effusion. No evidence of bursitis. Imaged muscles and tendons are intact. Imaged intrapelvic contents demonstrate no acute abnormality. Sigmoid diverticulosis noted.  IMPRESSION: Negative for acute abnormality.  Status post fixation of a remote healed right femoral neck fracture.  Lower lumbar spondylosis.  Sigmoid diverticulosis without diverticulitis.   Electronically Signed   By: Inge Rise M.D.   On: 01/09/2017 14:53    ____________________________________________   PROCEDURES  Procedure(s) performed:   Procedures  Critical Care performed:  ____________________________________________  INITIAL IMPRESSION / ASSESSMENT AND PLAN / ED COURSE  Pertinent labs & imaging results that were available during my care of the patient were reviewed by me and considered in my medical decision making (see chart for details).  Patient reports sure he has pain pills and does not want anymore [place] Swann she has.      ____________________________________________   FINAL CLINICAL IMPRESSION(S) / ED DIAGNOSES  Final diagnoses:  Pain aggravated by physical activity  Contusion of hip, unspecified laterality, initial encounter   Diagnosis also includes impaction fracture of humerus not involving the joint she does not want anything for that she reports it's a little stiff in the morning   NEW MEDICATIONS STARTED DURING THIS VISIT:  New Prescriptions   No medications on file     Note:  This document was prepared using Dragon voice recognition software and may include unintentional dictation errors.    Nena Polio, MD 01/09/17 [phone]

## 2017-01-23 ENCOUNTER — Ambulatory Visit: Payer: Medicare Other | Admitting: Gastroenterology

## 2017-01-25 ENCOUNTER — Ambulatory Visit: Payer: Medicare Other | Admitting: Gastroenterology

## 2017-02-18 ENCOUNTER — Other Ambulatory Visit: Payer: Self-pay | Admitting: Family Medicine

## 2017-02-18 NOTE — Telephone Encounter (Signed)
Last office visit 11/02/16.  Last refilled 08/29/16 for #90 with 1 refill. Ok to refill?

## 2017-02-18 NOTE — Telephone Encounter (Signed)
Please call in.  Thanks.   

## 2017-02-19 NOTE — Telephone Encounter (Signed)
Rx called to pharmacy as instructed. 

## 2017-02-20 ENCOUNTER — Other Ambulatory Visit: Payer: Self-pay | Admitting: Family Medicine

## 2017-02-20 NOTE — Telephone Encounter (Signed)
Last office visit 11/02/2016.  Last refilled Lorazepam 09/19/2016 for #40 with 2 refills. Vicodin 11/23/2016 for #180 with no refills.  Ok to refill?

## 2017-02-21 NOTE — Telephone Encounter (Signed)
Patient called back and [name] advised her that script is up front ready for pickup.

## 2017-02-21 NOTE — Telephone Encounter (Signed)
Printed both.  Thanks.  Sedation caution on both meds.

## 2017-02-21 NOTE — Telephone Encounter (Signed)
Left message on voicemail for patient to call back. Prescription left up front for pickup. 

## 2017-03-05 ENCOUNTER — Encounter: Payer: Self-pay | Admitting: Gastroenterology

## 2017-03-05 ENCOUNTER — Other Ambulatory Visit: Payer: Medicare Other

## 2017-03-05 ENCOUNTER — Encounter (INDEPENDENT_AMBULATORY_CARE_PROVIDER_SITE_OTHER): Payer: Self-pay

## 2017-03-05 ENCOUNTER — Ambulatory Visit (INDEPENDENT_AMBULATORY_CARE_PROVIDER_SITE_OTHER): Payer: Medicare Other | Admitting: Gastroenterology

## 2017-03-05 VITALS — BP 138/74 | HR 66 | Ht 62.0 in | Wt 109.0 lb

## 2017-03-05 DIAGNOSIS — R1310 Dysphagia, unspecified: Secondary | ICD-10-CM | POA: Diagnosis not present

## 2017-03-05 DIAGNOSIS — R899 Unspecified abnormal finding in specimens from other organs, systems and tissues: Secondary | ICD-10-CM | POA: Diagnosis not present

## 2017-03-05 NOTE — Patient Instructions (Addendum)
If you are age 81 or older, your body mass index should be between 23-30. Your Body mass index is 19.94 kg/m. If this is out of the aforementioned range listed, please consider follow up with your Primary Care Provider.  If you are age 69 or younger, your body mass index should be between 19-25. Your Body mass index is 19.94 kg/m. If this is out of the aformentioned range listed, please consider follow up with your Primary Care Provider.   You have been scheduled for an endoscopy. Please follow written instructions given to you at your visit today. If you use inhalers (even only as needed), please bring them with you on the day of your procedure. Your physician has requested that you go to www.startemmi.com and enter the access code given to you at your visit today. This web site gives a general overview about your procedure. However, you should still follow specific instructions given to you by our office regarding your preparation for the procedure.  Your physician has requested that you go to the basement for the following lab work before leaving today:  TTG  Thank you.

## 2017-03-05 NOTE — Progress Notes (Signed)
HPI :  83 year old female here for a follow-up evaluation for symptoms of dysphagia. She had a prior thyroid cancer, and is s/p thyroidectomy in 1995 as well as radiation therapy. She feels she has a "scraping" sensation in her throat with swallows. She has no trouble with liquids, only solids bother her. If she does not chew well she will have dysphagia in her upper chest / sternal notch.   She was given a trial of omeprazole 40mg  since her last visit, she is not sure if it helped or how long she took it. She has occasional odynophagia. She also tried mycostatin swish and swallow. She was seen by ENT physician but we don't have the report, she was told it was nothing to worry about. She tries to eat slowly and small bites to avoid dysphagia in sternal notch. She has rare pyrosis, doesn't bother her too much.   She has lost some weight, not trying to do so. She thinks she has lost 10 lbs over the past several months. No nausea or vomiting. She has a reported history of IBS. She is unaware of a history of celiac disease. She has had intermittent loose stools over the years. She uses metamucil to keep her regular. No blood in the stools.   Of note the patient had positive celiac serologies in 2012 - TTG level of 24. Also noted to be vitamin D deficient. Prior EGD showed nonspecific "duodenitis" in 2012.   Colonoscopy in 2012 with finding of severe left-sided diverticulosis, no polyps  EGD in 2012 - Barrett's esophagus no dysplasia (irregular z-line) and chronic atrophic gastric mucosa as well as small hiatal hernia. biopsies showed chronic gastritis H. pylori negative peptic duodenitis .   Past Medical History:  Diagnosis Date  . Barrett esophagus   . Diplopia    history of,hospital 2/26-2/27/10  . Diverticulitis    colonoscopy 1995.  CT 01/31/09, 02/02/09  . Hiatal hernia   . Hiatal hernia    EGD 1998, 2007 ( Dr. Sharlett Iles)  . Hyperlipemia   . Hypertension   . Hypothyroidism   .  Osteoporosis DXA 2015  . Personal history of colonic polyps    colonoscpy, removed 1995  . Thyroid carcinoma ([place]) 01/1994  . UTI (urinary tract infection)    Chronic     Past Surgical History:  Procedure Laterality Date  . ABDOMINAL HYSTERECTOMY    . CHOLECYSTECTOMY    . gyn surgery     hysterectomy  . HIP SURGERY  08/1993   due to fracture of right hip  . THYROIDECTOMY  04/1994   thyroid cancer   Family History  Problem Relation Age of Onset  . Cancer Brother     Pharyngeal   . Heart disease Brother     MI  . Diabetes Brother   . Diabetes Sister     amputation  . Hypertension Mother   . Stroke Father    Social History  Substance Use Topics  . Smoking status: Never Smoker  . Smokeless tobacco: Never Used  . Alcohol use No   Current Outpatient Prescriptions  Medication Sig Dispense Refill  . Artificial Saliva (BIOTENE DRY MOUTH MOISTURIZING) SOLN Use as directed in the mouth or throat.    . B Complex Vitamins (B COMPLEX-B12 PO) Take by mouth. Taken daily    . Cholecalciferol ([employer]S VITAMIN D3) 1000 UNITS CHEW Chew 1 tablet (1,000 Units total) by mouth daily.    . clidinium-chlordiazePOXIDE ([name]) 5-2.5 MG capsule TAKE 1 CAPSULE  BY MOUTH ONCE DAILY 90 capsule 1  . HYDROcodone-acetaminophen (NORCO/VICODIN) 5-325 MG tablet TAKE ONE-HALF TO ONE TABLET BY MOUTH EVERY 6 HOURS AS NEEDED FOR PAIN. 180 tablet 0  . lisinopril (PRINIVIL,ZESTRIL) 10 MG tablet Take 1 tablet (10 mg total) by mouth Nightly. 90 tablet 3  . loratadine (CVS ALLERGY RELIEF) 10 MG dissolvable tablet Take 10 mg by mouth daily as needed for allergies.     Marland Kitchen LORazepam (ATIVAN) 0.5 MG tablet TAKE ONE-HALF TO ONE TABLET BY MOUTH TWOTIMES DAILY AS NEEDED FOR ANXIETY 40 tablet 2  . mirtazapine (REMERON) 15 MG tablet TAKE 1 TABLET BY MOUTH EVERY NIGHT AT BEDTIME 90 tablet 3  . Multiple Vitamin (MULTIVITAMIN PO) Take by mouth daily.      Marland Kitchen nystatin (MYCOSTATIN) 100000 UNIT/ML suspension Take 5 ml by mouth, swish  and swallow, 4 times daily after meals. 280 mL 0  . omeprazole (PRILOSEC) 40 MG capsule One tab by mouth 50min before breakfast 90 capsule 3  . polyethylene glycol powder (MIRALAX) powder Take 17 g by mouth every evening.     Marland Kitchen SYNTHROID 88 MCG tablet TAKE 1 TABLET BY MOUTH DAILY 90 tablet 1   No current facility-administered medications for this visit.    Allergies  Allergen Reactions  . Bentyl [Dicyclomine Hcl] Other (See Comments)    abd pain  . Ciprofloxacin     Can cause GI upset, esp if taken with metamucil, o/w it is tolerated  . Cortisone Acetate     REACTION: nausea  . Doxycycline Other (See Comments)    GI upset  . Gabapentin     REACTION: severe dizziness  . Metoclopramide Hcl   . Penicillins     Has patient had a PCN reaction causing immediate rash, facial/tongue/throat swelling, SOB or lightheadedness with hypotension: yes Has patient had a PCN reaction causing severe rash involving mucus membranes or skin necrosis: yes Has patient had a PCN reaction that required hospitalization no Has patient had a PCN reaction occurring within the last 10 years: no If all of the above answers are "NO", then may proceed with Cephalosporin use.   . Promethazine Hcl     REACTION: unspecified     Review of Systems: All systems reviewed and negative except where noted in HPI.   Lab Results  Component Value Date   WBC 5.4 09/19/2016   HGB 12.0 09/19/2016   HCT 35.2 (L) 09/19/2016   MCV 89.1 09/19/2016   PLT 210.0 09/19/2016    Lab Results  Component Value Date   CREATININE 0.91 09/19/2016   BUN 11 09/19/2016   NA 137 09/19/2016   K 3.8 09/19/2016   CL 100 09/19/2016   CO2 31 09/19/2016    Lab Results  Component Value Date   ALT 11 09/19/2016   AST 22 09/19/2016   ALKPHOS 72 09/19/2016   BILITOT 0.5 09/19/2016     Physical Exam: BP 138/74   Pulse 66   Ht 5\' 2"  (1.575 m)   Wt 109 lb (49.4 kg)   LMP 03/28/1989   BMI 19.94 kg/m  Constitutional:  Pleasant,female in no acute distress. HEENT: Normocephalic and atraumatic. Conjunctivae are normal. No scleral icterus. Neck supple.  Cardiovascular: Normal rate, regular rhythm.  Pulmonary/chest: Effort normal and breath sounds normal. No wheezing, rales or rhonchi. Abdominal: Soft, nondistended, nontender. . There are no masses palpable. No hepatomegaly. Extremities: no edema Lymphadenopathy: No cervical adenopathy noted. Neurological: Alert and oriented to person place and time. Skin: Skin is  warm and dry. No rashes noted. Psychiatric: Normal mood and affect. Behavior is normal.   ASSESSMENT AND PLAN: 83 year old female with a history of short segment Barrett's esophagus and thyroid cancer status post surgery and radiation remotely, presenting with ongoing intermittent solid food dysphagia.   Dysphagia - she had a recent ENT evaluation for chronic sore throat and pharyngeal discomfort and we'll try to obtain these records. Otherwise discussed the differential of dysphagia with her. She has a history of radiation to the neck, putting her at risk for radiation related stricture, also with history of short segment Barrett's, although this appeared to be a very low risk lesion at the time of the last endoscopy. Discussed further evaluation with her to include barium swallow versus endoscopy. Initially she had been referred for a barium swallow but did not complete it. She is otherwise in good health for her age, given her ongoing symptoms she wanted to proceed with endoscopy first. Discussed risks and benefits of endoscopy with her and she wished proceed.  I discussed with her that I'll probably perform empiric dilation if the exam is otherwise normal which she was agreeable with.   Nonspecific lab abnormality - chronic intermittent loose stools. Should a mildly positive celiac serology 6 years ago along with duodenitis noted on her last EGD. Repeat TTG to see if this is persistently positive, and  plan for biopsies of the duodenum again to assess for potential celiac disease. I discussed this possibility with her for a bit, further recommendations pending results of these exams.   [place] Cellar, MD [facility] Pager [phone]

## 2017-03-06 LAB — TISSUE TRANSGLUTAMINASE, IGA: Tissue Transglutaminase Ab, IgA: 1 U/mL (ref <4)

## 2017-03-08 ENCOUNTER — Ambulatory Visit (AMBULATORY_SURGERY_CENTER): Payer: Medicare Other | Admitting: Gastroenterology

## 2017-03-08 VITALS — BP 137/83 | HR 82 | Temp 97.3°F | Resp 15 | Ht 62.0 in | Wt 109.0 lb

## 2017-03-08 DIAGNOSIS — K222 Esophageal obstruction: Secondary | ICD-10-CM

## 2017-03-08 DIAGNOSIS — R1310 Dysphagia, unspecified: Secondary | ICD-10-CM | POA: Diagnosis not present

## 2017-03-08 DIAGNOSIS — K2980 Duodenitis without bleeding: Secondary | ICD-10-CM | POA: Diagnosis not present

## 2017-03-08 DIAGNOSIS — I10 Essential (primary) hypertension: Secondary | ICD-10-CM | POA: Diagnosis not present

## 2017-03-08 DIAGNOSIS — K219 Gastro-esophageal reflux disease without esophagitis: Secondary | ICD-10-CM | POA: Diagnosis not present

## 2017-03-08 MED ORDER — SODIUM CHLORIDE 0.9 % IV SOLN: 500.0000 mL | INTRAVENOUS | Status: DC

## 2017-03-08 NOTE — Patient Instructions (Signed)
YOU HAD AN ENDOSCOPIC PROCEDURE TODAY AT THE [facility]:   Refer to the procedure report that was given to you for any specific questions about what was found during the examination.  If the procedure report does not answer your questions, please call your gastroenterologist to clarify.  If you requested that your care partner not be given the details of your procedure findings, then the procedure report has been included in a sealed envelope for you to review at your convenience later.  YOU SHOULD EXPECT: Some feelings of bloating in the abdomen. Passage of more gas than usual.  Walking can help get rid of the air that was put into your GI tract during the procedure and reduce the bloating. If you had a lower endoscopy (such as a colonoscopy or flexible sigmoidoscopy) you may notice spotting of blood in your stool or on the toilet paper. If you underwent a bowel prep for your procedure, you may not have a normal bowel movement for a few days.  Please Note:  You might notice some irritation and congestion in your nose or some drainage.  This is from the oxygen used during your procedure.  There is no need for concern and it should clear up in a day or so.  SYMPTOMS TO REPORT IMMEDIATELY:   Following upper endoscopy (EGD)  Vomiting of blood or coffee ground material  New chest pain or pain under the shoulder blades  Painful or persistently difficult swallowing  New shortness of breath  Fever of 100F or higher  Black, tarry-looking stools  For urgent or emergent issues, a gastroenterologist can be reached at any hour by calling [phone].   DIET:  We do recommend a small meal at first, but then you may proceed to your regular diet.  Drink plenty of fluids but you should avoid alcoholic beverages for 24 hours.  ACTIVITY:  You should plan to take it easy for the rest of today and you should NOT DRIVE or use heavy machinery until tomorrow (because of the sedation medicines used  during the test).    FOLLOW UP: Our staff will call the number listed on your records the next business day following your procedure to check on you and address any questions or concerns that you may have regarding the information given to you following your procedure. If we do not reach you, we will leave a message.  However, if you are feeling well and you are not experiencing any problems, there is no need to return our call.  We will assume that you have returned to your regular daily activities without incident.  If any biopsies were taken you will be contacted by phone or by letter within the next 1-3 weeks.  Please call us at [phone] if you have not heard about the biopsies in 3 weeks.    SIGNATURES/CONFIDENTIALITY: You and/or your care partner have signed paperwork which will be entered into your electronic medical record.  These signatures attest to the fact that that the information above on your After Visit Summary has been reviewed and is understood.  Full responsibility of the confidentiality of this discharge information lies with you and/or your care-partner. 

## 2017-03-08 NOTE — Op Note (Signed)
[place] Patient Name: [name] Procedure Date: 03/08/2017 8:24 AM MRN: [id number] Endoscopist: Remo Lipps P. Armbruster MD, MD Age: 83 Referring MD:  Date of Birth: 04/30/1930 Gender: Female Account #: [id number] Procedure:                Upper GI endoscopy Indications:              Dysphagia, history of radiation therapy for thyroid                            cancer, also remote history of duodenitis with                            mildly positive TTG Medicines:                Monitored Anesthesia Care Procedure:                Pre-Anesthesia Assessment:                           - Prior to the procedure, a History and Physical                            was performed, and patient medications and                            allergies were reviewed. The patient's tolerance of                            previous anesthesia was also reviewed. The risks                            and benefits of the procedure and the sedation                            options and risks were discussed with the patient.                            All questions were answered, and informed consent                            was obtained. Prior Anticoagulants: The patient has                            taken no previous anticoagulant or antiplatelet                            agents. ASA Grade Assessment: II - A patient with                            mild systemic disease. After reviewing the risks                            and benefits, the patient was deemed in  satisfactory condition to undergo the procedure.                           After obtaining informed consent, the endoscope was                            passed under direct vision. Throughout the                            procedure, the patient's blood pressure, pulse, and                            oxygen saturations were monitored continuously. The                            Model GIF-HQ190  [id number]) scope was introduced                            through the mouth, and advanced to the second part                            of duodenum. The upper GI endoscopy was                            accomplished without difficulty. The patient                            tolerated the procedure well. Scope In: Scope Out: Findings:                 Esophagogastric landmarks were identified: the                            Z-line was found at 39 cm, the gastroesophageal                            junction was found at 39 cm and the upper extent of                            the gastric folds was found at 39 cm from the                            incisors.                           One suspected cricopharyngeal stricture was noted                            during intubation of the esophagus and was                            traversed. A guidewire was placed and the scope was                            withdrawn.  Dilation was performed with a Savary                            dilator with mild resistance at 14 mm and 16 mm,                            with a small mucosal wrent noted.                           The exam of the esophagus was otherwise normal.                           The entire examined stomach was normal.                           The duodenal bulb and second portion of the                            duodenum were normal. Biopsies for histology were                            taken with a cold forceps for evaluation of celiac                            disease given history outlined above. Complications:            No immediate complications. Estimated blood loss:                            Minimal. Estimated Blood Loss:     Estimated blood loss was minimal. Impression:               - Esophagogastric landmarks identified.                           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 Dilated to                            34mm as above.                           -  Normal stomach.                           - Normal duodenal bulb and second portion of the                            duodenum. Biopsied. Recommendation:           - Patient has a contact number available for                            emergencies. The signs and symptoms of potential                            delayed complications were discussed with the  patient. Return to normal activities tomorrow.                            Written discharge instructions were provided to the                            patient.                           - Resume previous diet.                           - Continue present medications.                           - Await pathology results.                           - Repeat upper endoscopy with dilation as needed                            for retreatment Steven P. Armbruster MD, MD 03/08/2017 8:45:15 AM This report has been signed electronically.

## 2017-03-08 NOTE — Progress Notes (Signed)
Report to PACU, RN, vss, BBS= Clear.  

## 2017-03-08 NOTE — Progress Notes (Signed)
Pt's states no medical or surgical changes since previsit or office visit. 

## 2017-03-09 ENCOUNTER — Telehealth: Payer: Self-pay

## 2017-03-09 NOTE — Telephone Encounter (Signed)
Encounter created in error

## 2017-03-09 NOTE — Telephone Encounter (Signed)
  Follow up Call-  Call back number 03/08/2017  Post procedure Call Back phone  # [phone]  Permission to leave phone message Yes  Some recent data might be hidden     Patient questions:  Do you have a fever, pain , or abdominal swelling? No. Pain Score  0 *  Have you tolerated food without any problems? Yes.    Have you been able to return to your normal activities? Yes.    Do you have any questions about your discharge instructions: Diet   No. Medications  No. Follow up visit  No.  Do you have questions or concerns about your Care? No.  Actions: * If pain score is 4 or above: No action needed, pain <4.

## 2017-03-10 ENCOUNTER — Emergency Department
Admission: EM | Admit: 2017-03-10 | Discharge: 2017-03-10 | Disposition: A | Discharge status: Home / Self Care | Payer: Medicare Other | Attending: Emergency Medicine | Admitting: Emergency Medicine

## 2017-03-10 ENCOUNTER — Emergency Department: Payer: Medicare Other

## 2017-03-10 DIAGNOSIS — R55 Syncope and collapse: Secondary | ICD-10-CM

## 2017-03-10 DIAGNOSIS — H539 Unspecified visual disturbance: Secondary | ICD-10-CM | POA: Diagnosis not present

## 2017-03-10 DIAGNOSIS — D649 Anemia, unspecified: Secondary | ICD-10-CM | POA: Diagnosis not present

## 2017-03-10 DIAGNOSIS — Z79899 Other long term (current) drug therapy: Secondary | ICD-10-CM | POA: Diagnosis not present

## 2017-03-10 DIAGNOSIS — I959 Hypotension, unspecified: Secondary | ICD-10-CM | POA: Diagnosis not present

## 2017-03-10 DIAGNOSIS — E039 Hypothyroidism, unspecified: Secondary | ICD-10-CM | POA: Insufficient documentation

## 2017-03-10 DIAGNOSIS — I10 Essential (primary) hypertension: Secondary | ICD-10-CM | POA: Diagnosis not present

## 2017-03-10 DIAGNOSIS — H538 Other visual disturbances: Secondary | ICD-10-CM | POA: Diagnosis not present

## 2017-03-10 DIAGNOSIS — E876 Hypokalemia: Secondary | ICD-10-CM | POA: Diagnosis not present

## 2017-03-10 LAB — BASIC METABOLIC PANEL
Anion gap: 7 (ref 5–15)
BUN: 11 mg/dL (ref 6–20)
CO2: 26 mmol/L (ref 22–32)
Calcium: 7.9 mg/dL — ABNORMAL LOW (ref 8.9–10.3)
Chloride: 105 mmol/L (ref 101–111)
Creatinine, Ser: 0.8 mg/dL (ref 0.44–1.00)
GFR calc Af Amer: >60 mL/min (ref >60)
GFR calc non Af Amer: >60 mL/min (ref >60)
Glucose, Bld: 119 mg/dL — ABNORMAL HIGH (ref 65–99)
Potassium: 3.4 mmol/L — ABNORMAL LOW (ref 3.5–5.1)
Sodium: 138 mmol/L (ref 135–145)

## 2017-03-10 LAB — URINALYSIS, COMPLETE (UACMP) WITH MICROSCOPIC
Bacteria, UA: NONE SEEN
Bilirubin Urine: NEGATIVE
Glucose, UA: NEGATIVE mg/dL
Hgb urine dipstick: NEGATIVE
Ketones, ur: NEGATIVE mg/dL
Leukocytes, UA: NEGATIVE
Nitrite: NEGATIVE
Protein, ur: NEGATIVE mg/dL
Specific Gravity, Urine: 1.005 (ref 1.005–1.030)
Squamous Epithelial / HPF: NONE SEEN
pH: 6 (ref 5.0–8.0)

## 2017-03-10 LAB — CBC
HCT: 32.5 % — ABNORMAL LOW (ref 35.0–47.0)
Hemoglobin: 11.2 g/dL — ABNORMAL LOW (ref 12.0–16.0)
MCH: 30.7 pg (ref 26.0–34.0)
MCHC: 34.5 g/dL (ref 32.0–36.0)
MCV: 88.9 fL (ref 80.0–100.0)
Platelets: 188 10*3/uL (ref 150–440)
RBC: 3.66 MIL/uL — ABNORMAL LOW (ref 3.80–5.20)
RDW: 12.7 % (ref 11.5–14.5)
WBC: 5.3 10*3/uL (ref 3.6–11.0)

## 2017-03-10 LAB — TROPONIN I: Troponin I: <0.03 ng/mL (ref <0.03)

## 2017-03-10 MED ORDER — POTASSIUM CHLORIDE CRYS ER 20 MEQ PO TBCR
40.0000 meq | EXTENDED_RELEASE_TABLET | Freq: Once | ORAL | Status: AC
  Administered 2017-03-10: 40 meq via ORAL
  Filled 2017-03-10: qty 2

## 2017-03-10 MED ORDER — CALCIUM GLUCONATE 10 % IV SOLN
1.0000 g | Freq: Once | INTRAVENOUS | Status: AC
  Administered 2017-03-10: 1 g via INTRAVENOUS
  Filled 2017-03-10: qty 10

## 2017-03-10 NOTE — ED Provider Notes (Signed)
[facility] Emergency Department Provider Note  ____________________________________________  Time seen: Approximately 5:13 PM  I have reviewed the triage vital signs and the nursing notes.   HISTORY  Chief Complaint Near Syncope    HPI [name] is a 83 y.o. female with a history of A. fib, Barrett's esophagus status post esophageal dilatation yesterday, HTN, HL, presenting for presyncope. The patient reports that since her procedure yesterday she feels that she is been drinking normally. Today, she was at a restaurant and after eating several bites she developed blurry and darkening vision and then felt "swimmy headed." She did not have any associated headache, numbness tingling or weakness. She did not have any associated chest pain, shortness of breath, or syncope. EMS reports blood pressure of 80s over 60s, which improved with 500 cc of fluid that were given in route. The patient has not had any other cough or cold symptoms, nausea vomiting or diarrhea, or fever.   Past Medical History:  Diagnosis Date  . Barrett esophagus   . Diplopia    history of,hospital 2/26-2/27/10  . Diverticulitis    colonoscopy 1995.  CT 01/31/09, 02/02/09  . Hiatal hernia   . Hiatal hernia    EGD 1998, 2007 ( Dr. Sharlett Iles)  . Hyperlipemia   . Hypertension   . Hypothyroidism   . Osteoporosis DXA 2015  . Personal history of colonic polyps    colonoscpy, removed 1995  . Thyroid carcinoma ([place]) 01/1994  . UTI (urinary tract infection)    Chronic    Patient Active Problem List   Diagnosis Date Noted  . Throat pain 11/03/2016  . Acute non-recurrent maxillary sinusitis 09/20/2016  . Loss of weight 09/20/2016  . Tick bite 08/08/2016  . Insomnia 03/10/2015  . Osteoporosis 11/01/2014  . Medicare annual wellness visit, subsequent 08/25/2014  . Advance care planning 08/25/2014  . Anxiety state 10/23/2012  . IBS (irritable bowel syndrome) 04/18/2011  . GERD  (gastroesophageal reflux disease) 04/18/2011  . Barrett esophagus 04/18/2011  . MULTIPLE CRANIAL NERVE PALSIES 01/29/2009  . CONVERGENCE INSUFF/PALSY BINOCULAR EYE MOVEMENT 01/22/2009  . KERATOACANTHOMA 11/18/2007  . Backache 05/15/2007  . Hypothyroidism 02/15/2007  . HYPERLIPIDEMIA 02/15/2007  . Essential hypertension 02/15/2007  . MITRAL VALVE PROLAPSE 02/15/2007  . ATRIAL FIBRILLATION, PAROXYSMAL 02/15/2007  . DIVERTICULOSIS, COLON 02/15/2007  . POSTMENOPAUSAL STATUS 02/15/2007  . COLONIC POLYPS, HX OF 02/15/2007  . DISORDER, DEPRESSIVE NEC 05/27/1998  . THYROID CANCER, HX OF 02/25/1994    Past Surgical History:  Procedure Laterality Date  . ABDOMINAL HYSTERECTOMY    . CHOLECYSTECTOMY    . gyn surgery     hysterectomy  . HIP SURGERY  08/1993   due to fracture of right hip  . THYROIDECTOMY  04/1994   thyroid cancer    Current Outpatient Rx  . Order #: [id number] Class: Historical Med  . Order #: [id number] Class: Historical Med  . Order #: [id number] Class: OTC  . Order #: [id number] Class: Phone In  . Order #: [id number] Class: Print  . Order #: [id number] Class: Normal  . Order #: [id number] Class: Historical Med  . Order #: [id number] Class: Print  . Order #: [id number] Class: Normal  . Order #: [id number] Class: Historical Med  . Order #: [id number] Class: Normal  . Order #: [id number] Class: Normal  . Order #: [id number] Class: Historical Med  . Order #: [id number] Class: Normal    Allergies Bentyl [dicyclomine hcl]; Ciprofloxacin; Cortisone acetate; Doxycycline; Gabapentin; Metoclopramide hcl; Penicillins; and Promethazine hcl  Family History  Problem Relation Age of Onset  .  Cancer Brother     Pharyngeal   . Heart disease Brother     MI  . Diabetes Brother   . Diabetes Sister     amputation  . Hypertension Mother   . Stroke Father     Social History Social History  Substance Use Topics  . Smoking status: Never Smoker  . Smokeless tobacco: Never Used  . Alcohol use No     Review of Systems Constitutional: No fever/chills.Positive lightheadedness. No syncope. Eyes: Blurred and darkening of vision, now resolved. ENT: No sore throat. No congestion or rhinorrhea. Cardiovascular: Denies chest pain. Denies palpitations. Respiratory: Denies shortness of breath.  No cough. Gastrointestinal: No abdominal pain.  No nausea, no vomiting.  No diarrhea.  No constipation. Genitourinary: Negative for dysuria. Musculoskeletal: Negative for back pain. Skin: Negative for rash. Neurological: Negative for headaches. No focal numbness, tingling or weakness.   10-point ROS otherwise negative.  ____________________________________________   PHYSICAL EXAM:  VITAL SIGNS: ED Triage Vitals [03/10/17 1620]  Enc Vitals Group     BP 115/76     Pulse Rate 82     Resp 16     Temp 97.8 F (36.6 C)     Temp Source Oral     SpO2 99 %     Weight 109 lb (49.4 kg)     Height 5\' 2"  (1.575 m)     Head Circumference      Peak Flow      Pain Score      Pain Loc      Pain Edu?      Excl. in [place]?     Constitutional: Alert and oriented. Well appearing and in no acute distress. Answers questions appropriately. Eyes: Conjunctivae are normal.  EOMI. No scleral icterus. Head: Atraumatic. Nose: No congestion/rhinnorhea. Mouth/Throat: Mucous membranes are moist.  Neck: No stridor.  Supple.   Cardiovascular: Normal rate, regular rhythm. No murmurs, rubs or gallops.  Respiratory: Normal respiratory effort.  No accessory muscle use or retractions. Lungs CTAB.  No wheezes, rales or ronchi. Gastrointestinal: Soft, nontender and nondistended.  No guarding or rebound.  No peritoneal signs. Musculoskeletal: No LE edema. No ttp in the calves or palpable cords.  Negative Homan's sign. Neurologic:  A&Ox3.  Speech is clear.  Face and smile are symmetric.  EOMI.  Moves all extremities well. Skin:  Skin is warm, dry and intact. No rash noted. Psychiatric: Mood and affect are normal. Speech and  behavior are normal.  Normal judgement.  ____________________________________________   LABS (all labs ordered are listed, but only abnormal results are displayed)  Labs Reviewed  BASIC METABOLIC PANEL - Abnormal; Notable for the following:       Result Value   Potassium 3.4 (*)    Glucose, Bld 119 (*)    Calcium 7.9 (*)    All other components within normal limits  CBC - Abnormal; Notable for the following:    RBC 3.66 (*)    Hemoglobin 11.2 (*)    HCT 32.5 (*)    All other components within normal limits  URINALYSIS, COMPLETE (UACMP) WITH MICROSCOPIC - Abnormal; Notable for the following:    Color, Urine STRAW (*)    APPearance CLEAR (*)    All other components within normal limits  TROPONIN I  CBG MONITORING, ED   ____________________________________________  EKG  ED ECG REPORT I, Eula Listen, the attending physician, personally viewed and interpreted this ECG.   Date: 03/10/2017  EKG Time: 1621  Rate: 81  Rhythm: normal sinus rhythm  Axis: normal  Intervals:none  ST&T Change: no STEMI  ____________________________________________  RADIOLOGY  Dg Chest 2 View  Result Date: 03/10/2017 CLINICAL DATA:  Syncope EXAM: CHEST  2 VIEW COMPARISON:  12/22/2013 FINDINGS: Cardiomediastinal silhouette is stable. Hyperinflation again noted. No infiltrate or pleural effusion. Bone island T11 vertebral body is stable IMPRESSION: No active cardiopulmonary disease.  Hyperinflation again noted. Electronically Signed   By: Lahoma Crocker M.D.   On: 03/10/2017 17:58    ____________________________________________   PROCEDURES  Procedure(s) performed: None  Procedures  Critical Care performed: No ____________________________________________   INITIAL IMPRESSION / ASSESSMENT AND PLAN / ED COURSE  Pertinent labs & imaging results that were available during my care of the patient were reviewed by me and considered in my medical decision making (see chart for  details).  83 y.o. female with an episode of presyncope. At this time, the patient is hemodynamically stable. There are no arrhythmias on her EKG, she has not had any chest pain. Her troponin is negative. Her laboratory studies show a chronic anemia which is grossly unchanged from previous. Her blood pressure went up during orthostatic vital sign evaluation. It is possible that the patient had a vagal episode, her that she was mildly hypokalemic from her procedure yesterday, but at this time I do not see any signs or symptoms that would be concerning for an emergency medical condition causing her presyncope. She feels back to her normal self, and if her chest x-ray does not show any acute abnormalities, I'll plan to discharge her home with instructions to drink plenty of fluid, eat small regular meals throughout the day, and follow up with her primary care physician. She understands return precautions.  ----------------------------------------- 6:05 PM on 03/10/2017 -----------------------------------------  The patient has continued to be hemodynamically stable throughout the entirety of her ED stay. Her symptoms have not returned. She is mildly hypokalemic which has been supplemented and her calcium is low which has also been supplemented. The patient will follow up with her primary care physician.  ____________________________________________  FINAL CLINICAL IMPRESSION(S) / ED DIAGNOSES  Final diagnoses:  Near syncope  Blurred vision  Chronic anemia  Hypokalemia  Hypocalcemia         NEW MEDICATIONS STARTED DURING THIS VISIT:  New Prescriptions   No medications on file      Eula Listen, MD 03/10/17 1805

## 2017-03-10 NOTE — Discharge Instructions (Addendum)
Please drink plenty of fluid and eat small regular meals throughout the day. Get plenty of rest.  Make an appointment with your primary care doctor to be re-evaluated and to have your potassium and calcium rechecked.  Return to the emergency department if you develop lightheadedness or fainting, severe pain, numbness tingling or weakness, chest pain or palpitations, or any other symptoms concerning to you.

## 2017-03-10 NOTE — ED Triage Notes (Addendum)
Pt came to ED via EMS from restaurant. Was eating and had near syncopal event. Now c/o headache, reports vision went blurry. Had procedure done yesterday to stretch esophagus. Per EMS, bp 80/40 on scene. 500 bolus fluids started by EMS. BP 115/76 on arrival. HR 82.

## 2017-03-14 ENCOUNTER — Encounter: Payer: Self-pay | Admitting: Gastroenterology

## 2017-03-16 ENCOUNTER — Encounter: Payer: Self-pay | Admitting: Family Medicine

## 2017-03-16 ENCOUNTER — Ambulatory Visit (INDEPENDENT_AMBULATORY_CARE_PROVIDER_SITE_OTHER): Payer: Medicare Other | Admitting: Family Medicine

## 2017-03-16 VITALS — BP 152/90 | HR 99 | Temp 97.5°F | Wt 110.0 lb

## 2017-03-16 DIAGNOSIS — R55 Syncope and collapse: Secondary | ICD-10-CM | POA: Diagnosis not present

## 2017-03-16 MED ORDER — LISINOPRIL 10 MG PO TABS: 5.0000 mg | ORAL_TABLET | Freq: Every day | ORAL | Status: DC

## 2017-03-16 NOTE — Patient Instructions (Addendum)
Go to the lab on the way out.  We'll contact you with your lab report. Cut the lisinopril back to 5mg  a day and update me next week about how you feel.  Take care.  Glad to see you.

## 2017-03-16 NOTE — Progress Notes (Signed)
Pre visit review using our clinic review tool, if applicable. No additional management support is needed unless otherwise documented below in the visit note. 

## 2017-03-18 DIAGNOSIS — R55 Syncope and collapse: Secondary | ICD-10-CM | POA: Insufficient documentation

## 2017-03-18 NOTE — Progress Notes (Signed)
ER follow-up. She had not been eating well at baseline due to dysphagia. She had seen GI. 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 Dilated to 41mm.  she was able to swallow better after that. She doesn't have any dysphagia at this point. A few days after the procedure she became lightheaded and nearly passed out. No full loss of consciousness. To emergency room. Evaluation noted. ER course discussed with patient. No reason to suspect acute coronary cause. She was given fluids and discharged home. She is occasionally been lightheaded. Due for recheck labs today. No chest pain. Not short of breath. No swelling. Compliant with medications. All questions answered.  PMH and SH reviewed  ROS: Per HPI unless specifically indicated in ROS section   Meds, vitals, and allergies reviewed.   GEN: nad, alert and oriented HEENT: mucous membranes moist NECK: supple w/o LA CV: rrr. PULM: ctab, no inc wob ABD: soft, +bs EXT: no edema SKIN: no acute rash

## 2017-03-18 NOTE — Assessment & Plan Note (Signed)
She likely had not been eating well at baseline, then had diet restriction related to the EGD, and likely had relative overtreatment for blood pressure. Recheck labs pending today. No chest pain. No shortness of breath. I want her to cut the lisinopril back to 5mg  a day and update me next week about how she feels. She agrees. At this point still okay for outpatient follow-up. All questions answered. >25 minutes spent in face to face time with patient, >50% spent in counselling or coordination of care.

## 2017-03-19 ENCOUNTER — Telehealth: Payer: Self-pay | Admitting: Family Medicine

## 2017-03-19 LAB — CBC WITH DIFFERENTIAL/PLATELET
Basophils Absolute: 0 cells/uL (ref 0–200)
Basophils Relative: 0 %
Eosinophils Absolute: 325 cells/uL (ref 15–500)
Eosinophils Relative: 5 %
HCT: 36.6 % (ref 35.0–45.0)
Hemoglobin: 12.7 g/dL (ref 11.7–15.5)
Lymphocytes Relative: 28 %
Lymphs Abs: 1820 cells/uL (ref 850–3900)
MCH: 30.8 pg (ref 27.0–33.0)
MCHC: 34.7 g/dL (ref 32.0–36.0)
MCV: 88.6 fL (ref 80.0–100.0)
MPV: 10 fL (ref 7.5–12.5)
Monocytes Absolute: 520 cells/uL (ref 200–950)
Monocytes Relative: 8 %
Neutro Abs: 3835 cells/uL (ref 1500–7800)
Neutrophils Relative %: 59 %
Platelets: 244 10*3/uL (ref 140–400)
RBC: 4.13 MIL/uL (ref 3.80–5.10)
RDW: 13.5 % (ref 11.0–15.0)
WBC: 6.5 10*3/uL (ref 3.8–10.8)

## 2017-03-19 LAB — BASIC METABOLIC PANEL
BUN: 11 mg/dL (ref 7–25)
CO2: 28 mmol/L (ref 20–31)
Calcium: 9.3 mg/dL (ref 8.6–10.4)
Chloride: 103 mmol/L (ref 98–110)
Creat: 0.89 mg/dL — ABNORMAL HIGH (ref 0.60–0.88)
Glucose, Bld: 92 mg/dL (ref 65–99)
Potassium: 5.2 mmol/L (ref 3.5–5.3)
Sodium: 141 mmol/L (ref 135–146)

## 2017-03-19 NOTE — Telephone Encounter (Signed)
[facility] called to report stat labs are avail.

## 2017-04-02 ENCOUNTER — Telehealth: Payer: Self-pay | Admitting: *Deleted

## 2017-04-02 MED ORDER — LISINOPRIL 10 MG PO TABS: 10.0000 mg | ORAL_TABLET | Freq: Every day | ORAL | Status: DC

## 2017-04-02 NOTE — Telephone Encounter (Signed)
If she is eating/drinking well and her systolic blood pressure stays above 140 then it is reasonable to increase her lisinopril back to 10 mg a day. Thanks. Update me as needed.

## 2017-04-02 NOTE — Telephone Encounter (Signed)
Spoke to pt who states her BP had been elevated, and was 147/81. She was advised to take 1/2 tab of lisinopril, but she is wanting to know if she should increase back to 1 tab daily. pls advise

## 2017-04-03 NOTE — Telephone Encounter (Signed)
Patient advised.

## 2017-05-10 ENCOUNTER — Other Ambulatory Visit: Payer: Self-pay | Admitting: Family Medicine

## 2017-05-10 NOTE — Telephone Encounter (Signed)
Electronic refill request Hydrocodone  Last office visit:   03/16/17 Last Filled:    180 tablet 0 02/21/2017  Please advise.

## 2017-05-11 ENCOUNTER — Telehealth: Payer: Self-pay

## 2017-05-11 NOTE — Telephone Encounter (Signed)
Pt came to office to pick up hydrocodone apap rx. Pt thought she was getting rx for lorazepam also; I spoke with [place] at [facility] and Lorazepam is already for pick up. Mariann Laster will advise pt. Nothing further needed.

## 2017-05-11 NOTE — Telephone Encounter (Signed)
Printed.thanks.

## 2017-05-11 NOTE — Telephone Encounter (Signed)
Patient advised.  Rx left at front desk for pick up. 

## 2017-06-19 ENCOUNTER — Other Ambulatory Visit: Payer: Self-pay | Admitting: Family Medicine

## 2017-06-19 NOTE — Telephone Encounter (Signed)
Electronic refill request. Lorazepam Last office visit:   03/16/17 Last Filled:    40 tablet 2 02/21/2017  Please advise.

## 2017-06-20 ENCOUNTER — Telehealth: Payer: Self-pay

## 2017-06-20 NOTE — Telephone Encounter (Signed)
Rx called to pharmacy as instructed. 

## 2017-06-20 NOTE — Telephone Encounter (Signed)
[name] with [employer] left v/m that PA for Synthroid has been approved effective 06/20/17 and will be good for one yr. Approval letter will be sent in the mail.

## 2017-06-20 NOTE — Telephone Encounter (Signed)
Info for PA entered on cover my meds. Waiting on response.

## 2017-06-20 NOTE — Telephone Encounter (Signed)
Please call in.  Thanks.   

## 2017-07-18 ENCOUNTER — Ambulatory Visit: Payer: Medicare Other | Admitting: Family Medicine

## 2017-07-19 ENCOUNTER — Ambulatory Visit: Payer: Medicare Other | Admitting: Family Medicine

## 2017-07-23 ENCOUNTER — Ambulatory Visit (INDEPENDENT_AMBULATORY_CARE_PROVIDER_SITE_OTHER): Payer: Medicare Other | Admitting: Family Medicine

## 2017-07-23 ENCOUNTER — Encounter: Payer: Self-pay | Admitting: Family Medicine

## 2017-07-23 ENCOUNTER — Other Ambulatory Visit: Payer: Self-pay | Admitting: *Deleted

## 2017-07-23 VITALS — BP 126/70 | HR 86 | Temp 97.8°F | Wt 106.5 lb

## 2017-07-23 DIAGNOSIS — R634 Abnormal weight loss: Secondary | ICD-10-CM

## 2017-07-23 DIAGNOSIS — R300 Dysuria: Secondary | ICD-10-CM | POA: Diagnosis not present

## 2017-07-23 LAB — POC URINALSYSI DIPSTICK (AUTOMATED)
Bilirubin, UA: NEGATIVE
Blood, UA: NEGATIVE
Glucose, UA: NEGATIVE
Ketones, UA: NEGATIVE
Nitrite, UA: NEGATIVE
Protein, UA: NEGATIVE
Spec Grav, UA: 1.015 (ref 1.010–1.025)
Urobilinogen, UA: 0.2 E.U./dL
pH, UA: 6 (ref 5.0–8.0)

## 2017-07-23 MED ORDER — SULFAMETHOXAZOLE-TRIMETHOPRIM 800-160 MG PO TABS: 1.0000 | ORAL_TABLET | Freq: Two times a day (BID) | ORAL | 0 refills | Status: DC

## 2017-07-23 MED ORDER — SYNTHROID 88 MCG PO TABS: 88.0000 ug | ORAL_TABLET | Freq: Every day | ORAL | 1 refills | Status: DC

## 2017-07-23 NOTE — Progress Notes (Signed)
Dysuria: burning and pain and pressure duration of symptoms: >1 week abdominal pain: lower abd pain fevers:no back pain: continues at baseline vomiting:no other concerns: tried to drink more fluids, cranberry juice, etc.    Weight is down, d/w. Still eating well.  D/w pt about getting her routine labs done when not concurrently ill.  She added on ensure and that helped with her energy level.    She'll need refill on thyroid med soon, d/w pt.  Compliant. Plan for routine check later this fall.    Dec hearing in L ear recently noted.    Meds, vitals, and allergies reviewed.   Per HPI unless specifically indicated in ROS section   GEN: nad, alert and oriented HEENT: mucous membranes moist, wax removed from L ear with curette w/o complication- hearing better per patient.  Recheck canal wnl.  R canal and TM wnl.   NECK: supple CV: rrr.  PULM: ctab, no inc wob ABD: soft, +bs, suprapubic area tender EXT: no edema SKIN: no acute rash BACK: no CVA pain

## 2017-07-23 NOTE — Assessment & Plan Note (Signed)
Unclear source, I did not want to get a false positive with her labs today. Discussed with patient. Return for labs when she is feeling better and over the acute illness. She agrees. She has been supplementing her diet with ensure.

## 2017-07-23 NOTE — Assessment & Plan Note (Signed)
Nontoxic, likely cystitis, Septra, urine culture. See notes on labs. Okay for outpatient follow-up.

## 2017-07-23 NOTE — Patient Instructions (Signed)
Drink plenty of water and start the antibiotics today.  We'll contact you with your lab report.  Take care.    Plan on recheck blood labs when the urinary symptoms are better.  Need a lab visit next week.

## 2017-07-23 NOTE — Telephone Encounter (Signed)
Medication phoned to pharmacy.  

## 2017-07-25 ENCOUNTER — Telehealth: Payer: Self-pay

## 2017-07-25 LAB — URINE CULTURE

## 2017-07-25 NOTE — Telephone Encounter (Signed)
Noted. Thanks.

## 2017-07-25 NOTE — Telephone Encounter (Signed)
Pt seen 07/23/17; f/u lab test not until 07/31/17; pt having pressure and pain upon urination;no better than when seen. Pt has not been taking abx; pt said she does not have any that [facility] did not get rx. I spoke with [facility] and they had e scribing issues but was going to call pt this morning that rx ready for pick up. Pt voiced understanding and will pick up abx today. FYI to Dr [name].

## 2017-07-31 ENCOUNTER — Telehealth: Payer: Self-pay | Admitting: Family Medicine

## 2017-07-31 ENCOUNTER — Other Ambulatory Visit (INDEPENDENT_AMBULATORY_CARE_PROVIDER_SITE_OTHER): Payer: Medicare Other

## 2017-07-31 DIAGNOSIS — R634 Abnormal weight loss: Secondary | ICD-10-CM

## 2017-07-31 DIAGNOSIS — R739 Hyperglycemia, unspecified: Secondary | ICD-10-CM

## 2017-07-31 LAB — CBC WITH DIFFERENTIAL/PLATELET
Basophils Absolute: 0 10*3/uL (ref 0.0–0.1)
Basophils Relative: 0.5 % (ref 0.0–3.0)
Eosinophils Absolute: 0.2 10*3/uL (ref 0.0–0.7)
Eosinophils Relative: 3.3 % (ref 0.0–5.0)
HCT: 35.4 % — ABNORMAL LOW (ref 36.0–46.0)
Hemoglobin: 11.9 g/dL — ABNORMAL LOW (ref 12.0–15.0)
Lymphocytes Relative: 24.3 % (ref 12.0–46.0)
Lymphs Abs: 1.3 10*3/uL (ref 0.7–4.0)
MCHC: 33.7 g/dL (ref 30.0–36.0)
MCV: 92 fl (ref 78.0–100.0)
Monocytes Absolute: 0.4 10*3/uL (ref 0.1–1.0)
Monocytes Relative: 8 % (ref 3.0–12.0)
Neutro Abs: 3.3 10*3/uL (ref 1.4–7.7)
Neutrophils Relative %: 63.9 % (ref 43.0–77.0)
Platelets: 205 10*3/uL (ref 150.0–400.0)
RBC: 3.85 Mil/uL — ABNORMAL LOW (ref 3.87–5.11)
RDW: 13 % (ref 11.5–15.5)
WBC: 5.2 10*3/uL (ref 4.0–10.5)

## 2017-07-31 LAB — COMPREHENSIVE METABOLIC PANEL
ALT: 9 U/L (ref 0–35)
AST: 21 U/L (ref 0–37)
Albumin: 3.9 g/dL (ref 3.5–5.2)
Alkaline Phosphatase: 61 U/L (ref 39–117)
BUN: 12 mg/dL (ref 6–23)
CO2: 28 mEq/L (ref 19–32)
Calcium: 8.9 mg/dL (ref 8.4–10.5)
Chloride: 100 mEq/L (ref 96–112)
Creatinine, Ser: 0.98 mg/dL (ref 0.40–1.20)
GFR: 57.33 mL/min — ABNORMAL LOW (ref >60.00)
Glucose, Bld: 104 mg/dL — ABNORMAL HIGH (ref 70–99)
Potassium: 4.4 mEq/L (ref 3.5–5.1)
Sodium: 135 mEq/L (ref 135–145)
Total Bilirubin: 0.3 mg/dL (ref 0.2–1.2)
Total Protein: 6.8 g/dL (ref 6.0–8.3)

## 2017-07-31 LAB — HEMOGLOBIN A1C: Hgb A1c MFr Bld: 5.7 % (ref 4.6–6.5)

## 2017-07-31 LAB — TSH: TSH: 0.72 u[IU]/mL (ref 0.35–4.50)

## 2017-07-31 NOTE — Telephone Encounter (Signed)
Patient Name: [name]  DOB: [birth year]    Initial Comment Caller states she has labs scheduled this morning and wants to know if she can eat or drink.   Nurse Assessment  Nurse: Raphael Gibney, RN, Vanita Ingles Date/Time (Eastern Time): 07/31/2017 8:18:47 AM  Confirm and document reason for call. If symptomatic, describe symptoms. ---Caller states she has labs scheduled at noon. Pt states she is having a LBTC-S and LBTC STC today at noon. Wants to know if she can eat and drink before the test.  Does the patient have any new or worsening symptoms? ---No  Please document clinical information provided and list any resource used. ---Advised caller that office is in a staff meeting until 8:30 am but note would be sent concerning whether or not she can eat. Verbalized understanding.     Guidelines    Guideline Title Affirmed Question Affirmed Notes       Final Disposition User   Clinical Call [place], RN, Vanita Ingles

## 2017-07-31 NOTE — Telephone Encounter (Signed)
Pt scheduled for labs today at 12 noon; advised to go ahead and eat now and then can have black coffee or water until labs drawn since CMP included in labs.pt voiced understanding.

## 2017-08-03 ENCOUNTER — Other Ambulatory Visit: Payer: Self-pay | Admitting: Family Medicine

## 2017-08-03 DIAGNOSIS — Z85850 Personal history of malignant neoplasm of thyroid: Secondary | ICD-10-CM

## 2017-08-07 NOTE — Addendum Note (Signed)
Addended by: Josetta Huddle on: 08/07/2017 08:16 AM   Modules accepted: Orders

## 2017-08-07 NOTE — Addendum Note (Signed)
Addended by: Josetta Huddle on: 08/07/2017 08:39 AM   Modules accepted: Orders

## 2017-08-15 ENCOUNTER — Ambulatory Visit
Admission: RE | Admit: 2017-08-15 | Discharge: 2017-08-15 | Disposition: A | Discharge status: Home / Self Care | Payer: Medicare Other | Source: Ambulatory Visit | Attending: Family Medicine | Admitting: Family Medicine

## 2017-08-15 DIAGNOSIS — Z85850 Personal history of malignant neoplasm of thyroid: Secondary | ICD-10-CM | POA: Insufficient documentation

## 2017-08-15 DIAGNOSIS — E890 Postprocedural hypothyroidism: Secondary | ICD-10-CM | POA: Insufficient documentation

## 2017-08-16 ENCOUNTER — Other Ambulatory Visit: Payer: Self-pay | Admitting: Family Medicine

## 2017-08-16 NOTE — Telephone Encounter (Signed)
Patient returned [name]'s call.  Please call patient back at [phone].

## 2017-08-16 NOTE — Telephone Encounter (Signed)
Electronic refill request. Hydrocodone Last office visit:   07/23/17 Last Filled:    180 tablet 0 05/11/2017  Please advise.

## 2017-08-17 ENCOUNTER — Encounter: Payer: Self-pay | Admitting: Family Medicine

## 2017-08-17 ENCOUNTER — Other Ambulatory Visit: Payer: Medicare Other

## 2017-08-17 ENCOUNTER — Other Ambulatory Visit: Payer: Self-pay | Admitting: Family Medicine

## 2017-08-17 DIAGNOSIS — Z0283 Encounter for blood-alcohol and blood-drug test: Secondary | ICD-10-CM

## 2017-08-17 IMAGING — CR DG CHEST 2V
1 series · 2 of 2 positions shown · non-contrast
Comparison: 12/22/2013

CLINICAL DATA: Syncope

EXAM:
CHEST  2 VIEW

[Series 1: dg chest 2 view · 0.14mm/px · 2 of 2 slices shown]
[im 1/2]
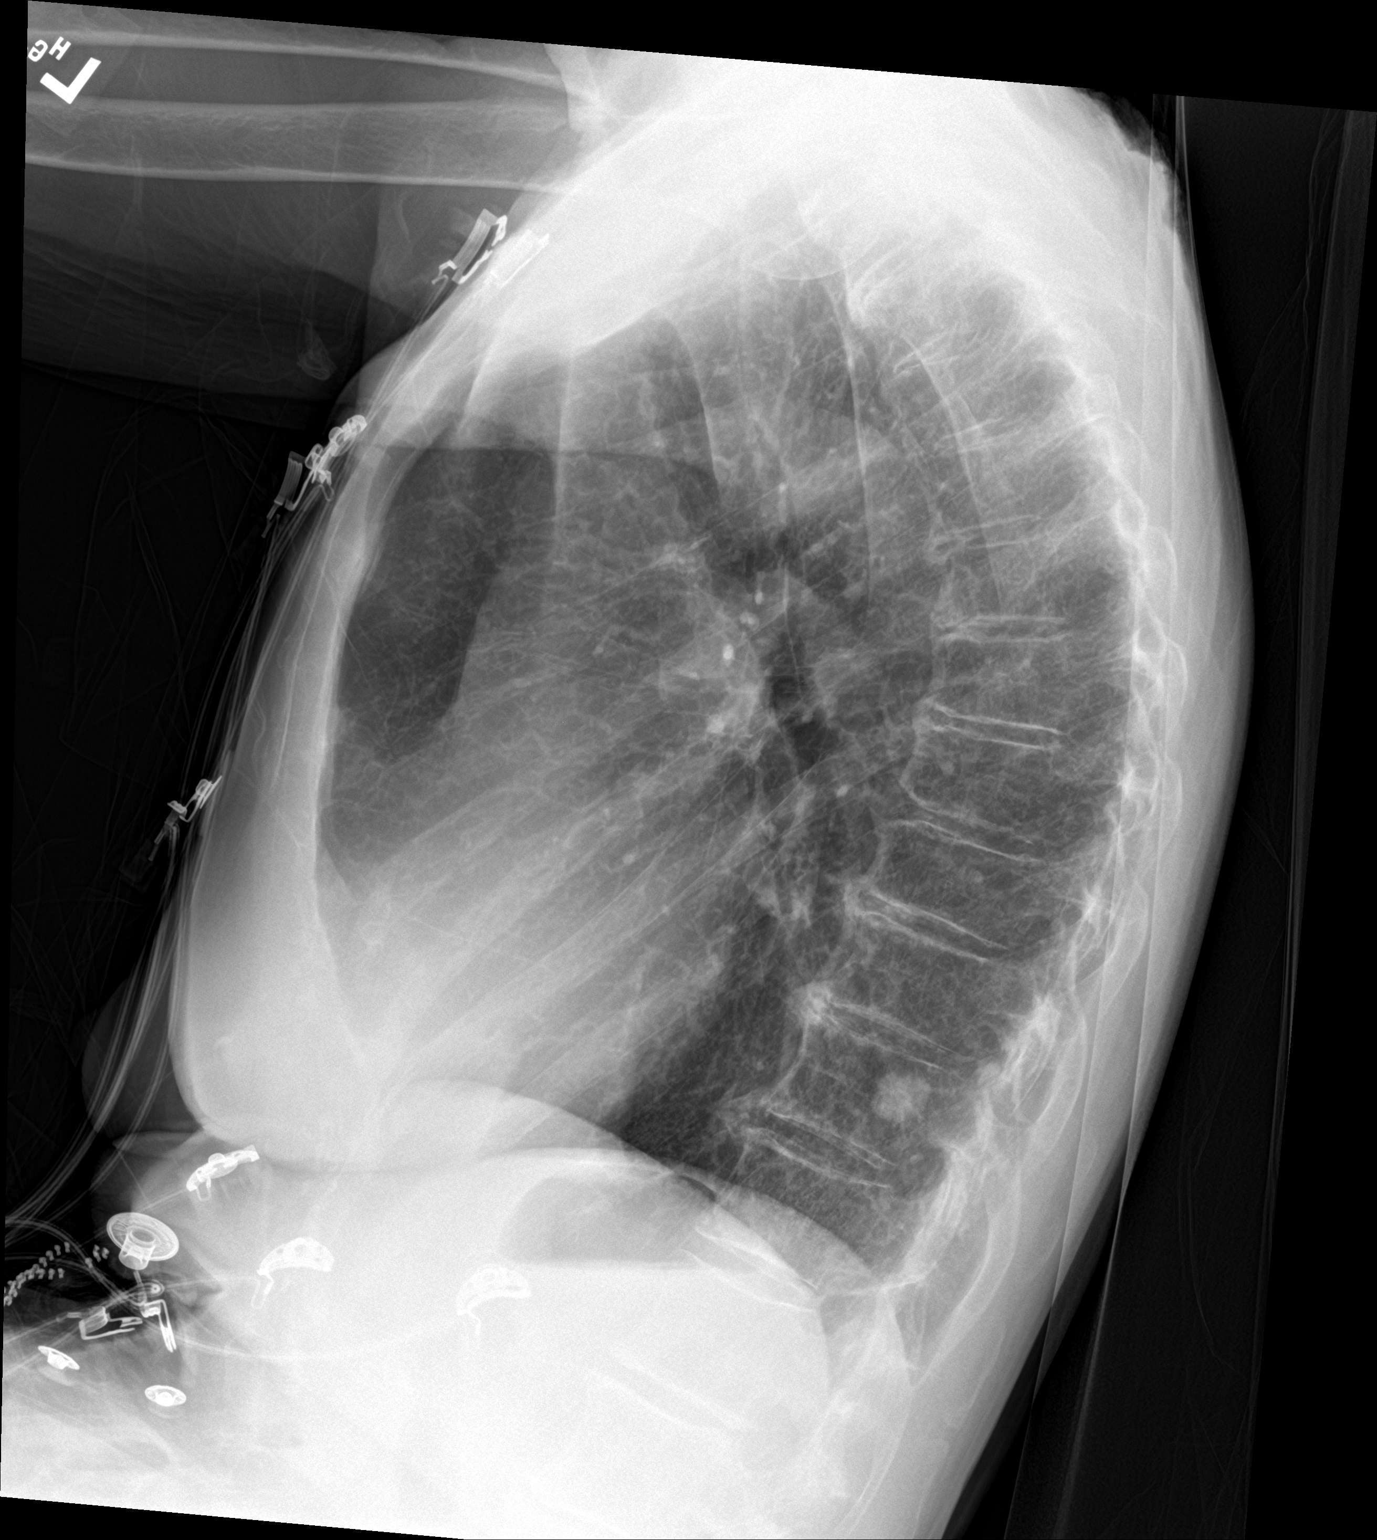
[im 2/2]
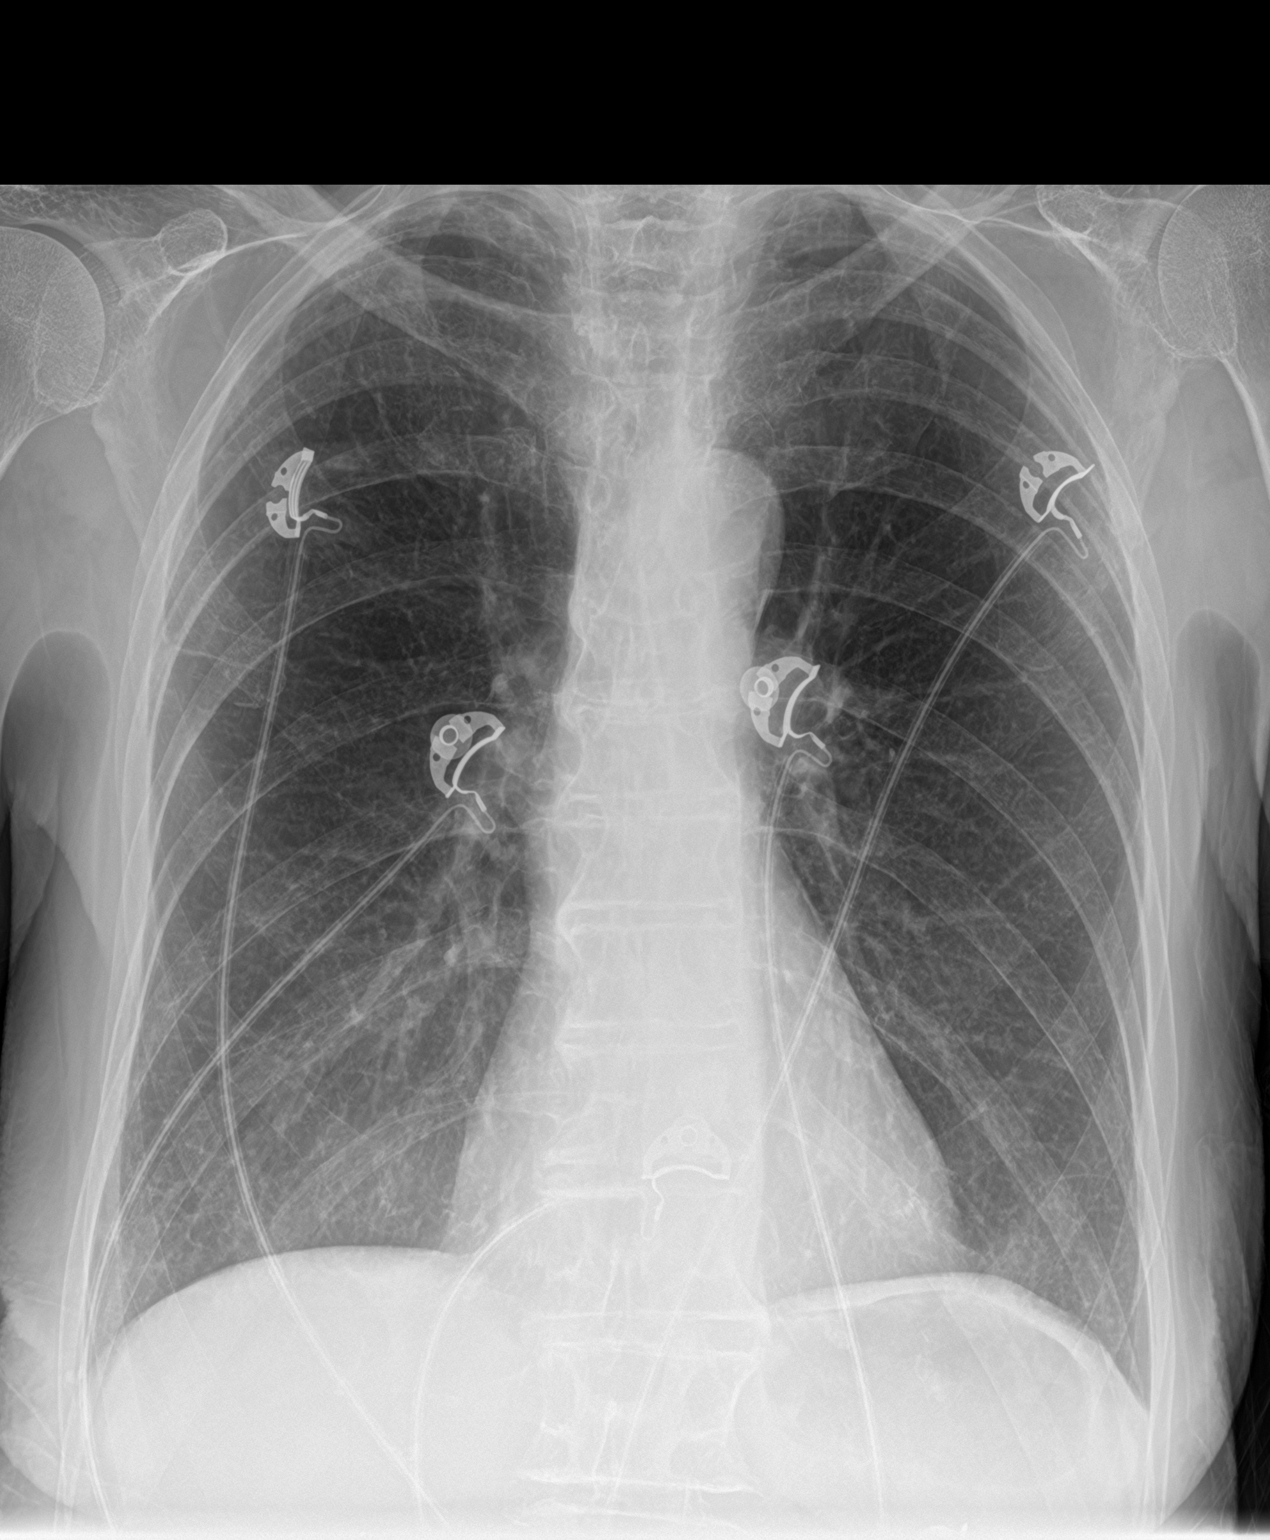

[2 of 2 positions shown; findings below may reference images not displayed]

FINDINGS: Cardiomediastinal silhouette is stable. Hyperinflation again noted.
No infiltrate or pleural effusion. Bone island T11 vertebral body is
stable
IMPRESSION: No active cardiopulmonary disease.  Hyperinflation again noted.

## 2017-08-17 NOTE — Progress Notes (Signed)
ERROR

## 2017-08-17 NOTE — Telephone Encounter (Signed)
Spoke to pt and informed her Rx is available for pickup from the front desk. Pt advised third party unable to pickup 

## 2017-08-17 NOTE — Telephone Encounter (Signed)
Printed.  Thanks.  

## 2017-08-22 ENCOUNTER — Telehealth: Payer: Self-pay | Admitting: Family Medicine

## 2017-08-22 DIAGNOSIS — R300 Dysuria: Secondary | ICD-10-CM

## 2017-08-22 LAB — PAIN MGMT, PROFILE 8 W/CONF, U
6 Acetylmorphine: NEGATIVE ng/mL (ref <10)
Alcohol Metabolites: NEGATIVE ng/mL (ref <500)
Alphahydroxyalprazolam: NEGATIVE ng/mL (ref <25)
Alphahydroxymidazolam: NEGATIVE ng/mL (ref <50)
Alphahydroxytriazolam: NEGATIVE ng/mL (ref <50)
Aminoclonazepam: NEGATIVE ng/mL (ref <25)
Amphetamines: NEGATIVE ng/mL (ref <500)
Benzodiazepines: POSITIVE ng/mL — AB (ref <100)
Buprenorphine, Urine: NEGATIVE ng/mL (ref <5)
Cocaine Metabolite: NEGATIVE ng/mL (ref <150)
Codeine: NEGATIVE ng/mL (ref <50)
Creatinine: 21.2 mg/dL
Hydrocodone: 398 ng/mL — ABNORMAL HIGH (ref <50)
Hydromorphone: NEGATIVE ng/mL (ref <50)
Hydroxyethylflurazepam: NEGATIVE ng/mL (ref <50)
Lorazepam: 240 ng/mL — ABNORMAL HIGH (ref <50)
MDMA: NEGATIVE ng/mL (ref <500)
Marijuana Metabolite: NEGATIVE ng/mL (ref <20)
Morphine: NEGATIVE ng/mL (ref <50)
Nordiazepam: NEGATIVE ng/mL (ref <50)
Norhydrocodone: 647 ng/mL — ABNORMAL HIGH (ref <50)
Opiates: POSITIVE ng/mL — AB (ref <100)
Oxazepam: 333 ng/mL — ABNORMAL HIGH (ref <50)
Oxidant: NEGATIVE ug/mL (ref <200)
Oxycodone: NEGATIVE ng/mL (ref <100)
Temazepam: NEGATIVE ng/mL (ref <50)
pH: 6.33 (ref 4.5–9.0)

## 2017-08-22 NOTE — Telephone Encounter (Signed)
[name], I'm having a difficulty time piecing together the need for the Urology referral. Do you have the original referral as I don't see it in her chart? I'll put it in once I know the reason for the referral.

## 2017-08-22 NOTE — Telephone Encounter (Signed)
Caller Name:Anthonette Arambula  Relationship to Patient:self Best [phone] Pharmacy:  Reason for call:  Pt is calling about urology referral.  She says that if it is something that she can drive to and from, she can go to [place].  If she needs to have a driver ( due to anesthesia?) she needs to go to [place]. Her neighbor will only drive to [employer]

## 2017-08-22 NOTE — Telephone Encounter (Signed)
Spoke to the patient , she wants the Urology Referral put in. She wants to go to [facility] in [place]. Please put these comments in the Referral so we can send directly to the [place] office. Please place the Urology Referral.

## 2017-08-24 NOTE — Telephone Encounter (Signed)
Brigitte Pulse, can you help? Thanks.

## 2017-08-24 NOTE — Telephone Encounter (Signed)
Thanks

## 2017-08-24 NOTE — Telephone Encounter (Signed)
Please clarify with patient.  Prev ucx waspositive. Klebsiella. Sensitive to the antibiotic she was prescribed. That should've taken care of it with the plan to update [name] if not better.  If she continues to have urinary sx, then let me know.   The big issue was her weight loss.  I think we either need to get her in at [facility] vs referral to GI re: early satiety. Please let me know which way she wants to go.  I put in the urology referral.  Thanks.

## 2017-08-24 NOTE — Telephone Encounter (Signed)
Patient is very undecided about how to handle this, Dr. [name] OV or GI OV.  OV scheduled with Dr. [name] to better help her assess her situation.

## 2017-08-24 NOTE — Telephone Encounter (Signed)
I never had a referral, they routed the message to me. I called the patient to ask her if she was requesting a Urology referral and she said yes. The last visit was for Dysuria

## 2017-08-25 ENCOUNTER — Other Ambulatory Visit: Payer: Self-pay | Admitting: Family Medicine

## 2017-08-27 NOTE — Telephone Encounter (Signed)
Received refill request electronically Last refill 02/18/17 #90/1 Last office visit 07/23/17

## 2017-08-27 NOTE — Telephone Encounter (Signed)
Please call in.  Thanks.   

## 2017-08-28 NOTE — Telephone Encounter (Signed)
Medication phoned to pharmacy.  

## 2017-08-30 ENCOUNTER — Ambulatory Visit (INDEPENDENT_AMBULATORY_CARE_PROVIDER_SITE_OTHER): Payer: Medicare Other | Admitting: Family Medicine

## 2017-08-30 ENCOUNTER — Encounter: Payer: Self-pay | Admitting: Family Medicine

## 2017-08-30 VITALS — BP 148/84 | HR 96 | Temp 97.7°F | Wt 106.5 lb

## 2017-08-30 DIAGNOSIS — R634 Abnormal weight loss: Secondary | ICD-10-CM | POA: Diagnosis not present

## 2017-08-30 DIAGNOSIS — R300 Dysuria: Secondary | ICD-10-CM

## 2017-08-30 LAB — POC URINALSYSI DIPSTICK (AUTOMATED)
Bilirubin, UA: NEGATIVE
Glucose, UA: NEGATIVE
Ketones, UA: NEGATIVE
Nitrite, UA: POSITIVE
Protein, UA: NEGATIVE
Spec Grav, UA: 1.015 (ref 1.010–1.025)
Urobilinogen, UA: 0.2 E.U./dL
pH, UA: 6 (ref 5.0–8.0)

## 2017-08-30 MED ORDER — NITROFURANTOIN MONOHYD MACRO 100 MG PO CAPS: 100.0000 mg | ORAL_CAPSULE | Freq: Two times a day (BID) | ORAL | 0 refills | Status: DC

## 2017-08-30 NOTE — Patient Instructions (Signed)
Drink plenty of water and start the antibiotics today.  We'll contact you with your lab report.  Keep the appointment with urology next week.  We'll be in touch about imaging vs a referral to the GI clinic about your weight loss. Take care.

## 2017-08-30 NOTE — Progress Notes (Signed)
Her urinary sx prev got some better but never really resolved totally.  Prev ucx with KLEBSIELLA PNEUMONIAE.  She was prev treated.  She has lower abd pain and pressure.  Burning with urination.  She has GI upset with sulfa rx but was able to take the whole rx.  She has f/u with GI pending.    She had sig weight loss in the last few months.  Her appetite is initially fair but she has early satiety recently.  No blood in stool.  No vomiting.  No black stools. She is a history of thyroid cancer but TSH is normal and thyroid ultrasound was unremarkable. No fevers chills or night sweats. No blood in stool. No vomiting.  PMH and SH reviewed  ROS: Per HPI unless specifically indicated in ROS section   Meds, vitals, and allergies reviewed.   GEN: nad, alert and oriented, elderly woman, thin. HEENT: mucous membranes moist NECK: supple w/o LA CV: rrr. PULM: ctab, no inc wob ABD: soft, +bs, suprapubic area tender to palpation. No CVA pain EXT: no edema SKIN: no acute rash

## 2017-08-31 ENCOUNTER — Telehealth: Payer: Self-pay

## 2017-08-31 ENCOUNTER — Telehealth: Payer: Self-pay | Admitting: Family Medicine

## 2017-08-31 DIAGNOSIS — R627 Adult failure to thrive: Secondary | ICD-10-CM

## 2017-08-31 DIAGNOSIS — R634 Abnormal weight loss: Secondary | ICD-10-CM

## 2017-08-31 MED ORDER — SULFAMETHOXAZOLE-TRIMETHOPRIM 800-160 MG PO TABS: 1.0000 | ORAL_TABLET | Freq: Two times a day (BID) | ORAL | 0 refills | Status: DC

## 2017-08-31 NOTE — Telephone Encounter (Signed)
I really think this is more likely to be from the UTI/local irritation and not from the medicine.  We really need to clarify this in the meantime because I don't want to list it as an allergy if it truly isn't one.   I would skip a dose and see how she feels.  If she isn't any better with a skipped dose, then it likely isn't the medicine.  If that is the case, then I would restart macrobid.   If she can't tolerate macrobid at all and does better off the medicine, then let us know so we can update the chart.   If she can't tolerate macrobid, then change to septa, with the expectation that she may have GI upset on that (as with prev). Thanks.

## 2017-08-31 NOTE — Assessment & Plan Note (Signed)
With lower abdominal pain. Presumed cystitis. Previous urine culture positive with Klebsiella but was treated. Recheck urine culture today. Okay for outpatient follow-up. Keep appointment with urology.

## 2017-08-31 NOTE — Assessment & Plan Note (Signed)
Her weight had usually been in the 110s to 120s historically, occasionally slightly higher. I talked to her about her social situation. She has enough groceries. I don't think this is failure to thrive related to social concerns. She does not seem depressed. She denies depressive symptoms. Unclear about the early satiety. I will ask GI if they prefer to see the patient first or if they would want imaging done before she arrives. I think that one or the other is the next step. Discussed with patient. She agrees. >25 minutes spent in face to face time with patient, >50% spent in counselling or coordination of care.

## 2017-08-31 NOTE — Telephone Encounter (Signed)
Rosaria Ferries has scheduled imaging.

## 2017-08-31 NOTE — Telephone Encounter (Signed)
Patient advised.

## 2017-08-31 NOTE — Telephone Encounter (Signed)
Pt took macrobid last night and another macrobid this morning; since taking macrobid pt has had burning on perineal area every time urinates. Pt washes off after urinating but still has continuous burning on perineal area. Pt thinks macrobid is causing this burning and pt is not going to take any more macrobid. Pt also feels weak in the knees. Pt wants different abx. To [facility] (not on allergy list until Dr [name] says to put it there.) pt request cb.

## 2017-08-31 NOTE — Telephone Encounter (Signed)
Call pt.  D/w GI about options.  Would get imaging done first.  Order placed.   We'll go from there.

## 2017-09-02 ENCOUNTER — Other Ambulatory Visit: Payer: Self-pay | Admitting: Family Medicine

## 2017-09-02 LAB — URINE CULTURE
MICRO NUMBER:: 81104263
SPECIMEN QUALITY:: ADEQUATE

## 2017-09-02 MED ORDER — CIPROFLOXACIN HCL 500 MG PO TABS: 500.0000 mg | ORAL_TABLET | Freq: Two times a day (BID) | ORAL | 0 refills | Status: DC

## 2017-09-03 NOTE — Progress Notes (Deleted)
09/04/2017 10:19 PM   [name] [facility] [birth year] [id number]  Referring provider: [name], MD [place], [place] [address]  No chief complaint on file.   HPI: Patient is a 83 -year-old *** female who is referred to [name] by, Dr. [name], for recurrent urinary tract infections.  Patient states that she has had *** urinary tract infections over the last year.  Reviewing her records,  she has had two documented positive cultures in the last 6 months.     Her symptoms with a urinary tract infection consist of ***.  She denies/endorses dysuria, gross hematuria, suprapubic pain, back pain, abdominal pain or flank pain.***  She denies/endorses dysuria, gross hematuria, suprapubic pain, back pain, abdominal pain or flank pain.***  She has not had any recent fevers, chills, nausea or vomiting. ***  She has not had any recent fevers, chills, nausea or vomiting. ***  She does/does not have a history of nephrolithiasis, GU surgery or GU trauma. ***  She does/does not have a history of nephrolithiasis, GU surgery or GU trauma. ***  She is/is not sexually active.  She has/has not noted a correlation with her urinary tract infections and sexual intercourse.  ***   She does/does not engage in anal sex. ***  She is/ is not having anal to vaginal sex.*** She is/is not voiding before and after sex. ***     She is/is not postmenopausal. ***  She admits to/denies constipation and/or diarrhea. ***  She does/does not use tampons.  She does/does not engage in good perineal hygiene. She does/does not take tub baths. ***  She has/does not have incontinence.  She is using incontinence pads. ***  She is having/ not having pain with bladder filling.  ***  Contrast CT scheduled for 09/07/2017.    She is drinking *** of water daily.     Reviewed referral notes - see above  PMH: Past Medical History:  Diagnosis Date  . Barrett esophagus   . Diplopia    history of,hospital 2/26-2/27/10  . Diverticulitis    colonoscopy 1995.  CT 01/31/09, 02/02/09  . Hiatal hernia   . Hiatal hernia    EGD 1998, 2007 ( Dr. Sharlett Iles)  . Hyperlipemia   . Hypertension   . Hypothyroidism   . Osteoporosis DXA 2015  . Personal history of colonic polyps    colonoscpy, removed 1995  . Thyroid carcinoma ([place]) 01/1994  . UTI (urinary tract infection)    Chronic    Surgical History: Past Surgical History:  Procedure Laterality Date  . ABDOMINAL HYSTERECTOMY    . CHOLECYSTECTOMY    . gyn surgery     hysterectomy  . HIP SURGERY  08/1993   due to fracture of right hip  . THYROIDECTOMY  04/1994   thyroid cancer    Home Medications:  Allergies as of 09/04/2017      Reactions   Bentyl [dicyclomine Hcl] Other (See Comments)   abd pain   Ciprofloxacin    Can cause GI upset, esp if taken with metamucil, o/w it is tolerated   Cortisone Acetate    REACTION: nausea   Doxycycline Other (See Comments)   GI upset   Gabapentin    REACTION: severe dizziness   Metoclopramide Hcl    Penicillins    Has patient had a PCN reaction causing immediate rash, facial/tongue/throat swelling, SOB or lightheadedness with hypotension: yes Has patient had a PCN reaction causing severe rash involving mucus membranes or skin  necrosis: yes Has patient had a PCN reaction that required hospitalization no Has patient had a PCN reaction occurring within the last 10 years: no If all of the above answers are "NO", then may proceed with Cephalosporin use.   Promethazine Hcl    REACTION: unspecified   Septra [sulfamethoxazole-trimethoprim] Other (See Comments)   GI upset but not allergy      Medication List       Accurate as of 09/03/17 10:19 PM. Always use your most recent med list.          B COMPLEX-B12 PO Take by mouth. Taken daily   BIOTENE DRY MOUTH MOISTURIZING Soln Use as directed in the mouth or throat.   Cholecalciferol 1000 units Chew Commonly known as:  CVS  VITAMIN D3 Chew 1 tablet (1,000 Units total) by mouth daily.   ciprofloxacin 500 MG tablet Commonly known as:  CIPRO Take 1 tablet (500 mg total) by mouth 2 (two) times daily.   clidinium-chlordiazePOXIDE 5-2.5 MG capsule Commonly known as:  [name] TAKE 1 CAPSULE BY MOUTH EVERY DAY   CVS ALLERGY RELIEF 10 MG dissolvable tablet Generic drug:  loratadine Take 10 mg by mouth daily as needed for allergies.   HYDROcodone-acetaminophen 5-325 MG tablet Commonly known as:  NORCO/VICODIN TAKE 1/2 TO 1 TABLET BY MOUTH EVERY 6 HOURS AS NEEDED FOR PAIN.   lisinopril 10 MG tablet Commonly known as:  PRINIVIL,ZESTRIL Take 1 tablet (10 mg total) by mouth daily.   LORazepam 0.5 MG tablet Commonly known as:  ATIVAN TAKE ONE-HALF TO ONE TABLET BY MOUTH TWOTIMES DAILY AS NEEDED FOR ANXIETY   MIRALAX powder Generic drug:  polyethylene glycol powder Take 17 g by mouth every evening.   mirtazapine 15 MG tablet Commonly known as:  REMERON TAKE 1 TABLET BY MOUTH EVERY NIGHT AT BEDTIME   MULTIVITAMIN PO Take by mouth daily.   nystatin 100000 UNIT/ML suspension Commonly known as:  MYCOSTATIN Take 5 ml by mouth, swish and swallow, 4 times daily after meals.   omeprazole 40 MG capsule Commonly known as:  PRILOSEC One tab by mouth 46mn before breakfast   SYNTHROID 88 MCG tablet Generic drug:  levothyroxine Take 1 tablet (88 mcg total) by mouth daily.       Allergies:  Allergies  Allergen Reactions  . Bentyl [Dicyclomine Hcl] Other (See Comments)    abd pain  . Ciprofloxacin     Can cause GI upset, esp if taken with metamucil, o/w it is tolerated  . Cortisone Acetate     REACTION: nausea  . Doxycycline Other (See Comments)    GI upset  . Gabapentin     REACTION: severe dizziness  . Metoclopramide Hcl   . Penicillins     Has patient had a PCN reaction causing immediate rash, facial/tongue/throat swelling, SOB or lightheadedness with hypotension: yes Has patient had a PCN  reaction causing severe rash involving mucus membranes or skin necrosis: yes Has patient had a PCN reaction that required hospitalization no Has patient had a PCN reaction occurring within the last 10 years: no If all of the above answers are "NO", then may proceed with Cephalosporin use.   . Promethazine Hcl     REACTION: unspecified  . Septra [Sulfamethoxazole-Trimethoprim] Other (See Comments)    GI upset but not allergy    Family History: Family History  Problem Relation Age of Onset  . Cancer Brother        Pharyngeal   . Heart disease Brother  MI  . Diabetes Brother   . Diabetes Sister        amputation  . Hypertension Mother   . Stroke Father     Social History:  reports that she has never smoked. She has never used smokeless tobacco. She reports that she does not drink alcohol or use drugs.  ROS:                                        Physical Exam: LMP 03/28/1989   Constitutional: Well nourished. Alert and oriented, No acute distress. HEENT: [place] AR[place] AT, moist mucus membranes. Trachea midline, no masses. Cardiovascular: No clubbing, cyanosis, or edema. Respiratory: Normal respiratory effort, no increased work of breathing. GI: Abdomen is soft, non tender, non distended, no abdominal masses. Liver and spleen not palpable.  No hernias appreciated.  Stool sample for occult testing is not indicated.   GU: No CVA tenderness.  No bladder fullness or masses.  Normal external genitalia, normal pubic hair distribution, no lesions.  Normal urethral meatus, no lesions, no prolapse, no discharge.   No urethral masses, tenderness and/or tenderness. No bladder fullness, tenderness or masses. Normal vagina mucosa, good estrogen effect, no discharge, no lesions, good pelvic support, no cystocele or rectocele noted.  No cervical motion tenderness.  Uterus is freely mobile and non-fixed.  No adnexal/parametria masses or tenderness noted.  Anus and perineum are  without rashes or lesions.   *** Skin: No rashes, bruises or suspicious lesions. Lymph: No cervical or inguinal adenopathy. Neurologic: Grossly intact, no focal deficits, moving all 4 extremities. Psychiatric: Normal mood and affect.  Laboratory Data: Lab Results  Component Value Date   WBC 5.2 07/31/2017   HGB 11.9 (L) 07/31/2017   HCT 35.4 (L) 07/31/2017   MCV 92.0 07/31/2017   PLT 205.0 07/31/2017    Lab Results  Component Value Date   CREATININE 0.98 07/31/2017    Lab Results  Component Value Date   HGBA1C 5.7 07/31/2017    Lab Results  Component Value Date   TSH 0.72 07/31/2017       Component Value Date/Time   CHOL 197 09/19/2016 1507   HDL 52.50 09/19/2016 1507   CHOLHDL 4 09/19/2016 1507   VLDL 24.0 09/19/2016 1507   LDLCALC 121 (H) 09/19/2016 1507    Lab Results  Component Value Date   AST 21 07/31/2017   Lab Results  Component Value Date   ALT 9 07/31/2017    Urinalysis ***  I have reviewed the labs.   Pertinent Imaging: *** I have independently reviewed the films.    Assessment & Plan:  ***  1. Recurrent UTI's  - criteria for recurrent UTI has been met with 2 or more infections in 6 months or 3 or greater infections in one year ***  - Patient is instructed to increase their water intake until the urine is pale yellow or clear (10 to 12 cups daily) ***  - probiotics (yogurt, oral pills or vaginal suppositories), take cranberry pills or drink the juice and Vitamin C 1,000 mg daily to acidify the urine should be added to their daily regimen ***  -. if using tampons, she should remove them prior to urinating and change them often ***  -avoid soaking in tubs and wipe front to back after urinating ***  - benefit from core strengthening exercises has been seen.  We can refer her to  PT if they desire ***  - advised them to have CATH UA's for urinalysis and culture to prevent skin contamination of the specimen  - reviewed symptoms of UTI and  advised not to have urine checked or be treated for UTI if not experiencing symptoms  - discussed antibiotic stewardship with the patient    2. Vaginal atrophy  - I explained to the patient that when women go through menopause and her estrogen levels are severely diminished, the normal vaginal flora will change.  This is due to an increase of the vaginal canal's pH. Because of this, the vaginal canal may be colonized by bacteria from the rectum instead of the protective lactobacillus.  This accompanied by the loss of the mucus barrier with vaginal atrophy is a cause of recurrent urinary tract infections.  - In some studies, the use of vaginal estrogen cream has been demonstrated to reduce  recurrent urinary tract infections to one a year.   - Patient was given a prescription for vaginal estrogen cream (Premarin/Estrace) and instructed to apply 0.68m (pea-sized amount)  just inside the vaginal introitus with a finger-tip every night for two weeks and then Monday, Wednesday and Friday nights.  I explained to the patient that vaginally administered estrogen, which causes only a slight increase in the blood estrogen levels, have fewer contraindications and adverse systemic effects that oral HT.  - She will follow up in three months for an exam.                                              No Follow-up on file.  These notes generated with voice recognition software. I apologize for typographical errors.  [facility] [address] [place] [phone]

## 2017-09-04 ENCOUNTER — Encounter: Payer: Self-pay | Admitting: Urology

## 2017-09-04 ENCOUNTER — Ambulatory Visit: Payer: Self-pay | Admitting: Urology

## 2017-09-07 ENCOUNTER — Ambulatory Visit
Admission: RE | Admit: 2017-09-07 | Discharge: 2017-09-07 | Disposition: A | Discharge status: Home / Self Care | Payer: Medicare Other | Source: Ambulatory Visit | Attending: Family Medicine | Admitting: Family Medicine

## 2017-09-07 DIAGNOSIS — R634 Abnormal weight loss: Secondary | ICD-10-CM | POA: Insufficient documentation

## 2017-09-07 DIAGNOSIS — Z9049 Acquired absence of other specified parts of digestive tract: Secondary | ICD-10-CM | POA: Insufficient documentation

## 2017-09-07 DIAGNOSIS — K5730 Diverticulosis of large intestine without perforation or abscess without bleeding: Secondary | ICD-10-CM | POA: Insufficient documentation

## 2017-09-07 DIAGNOSIS — R1032 Left lower quadrant pain: Secondary | ICD-10-CM | POA: Diagnosis not present

## 2017-09-07 DIAGNOSIS — Z90710 Acquired absence of both cervix and uterus: Secondary | ICD-10-CM | POA: Insufficient documentation

## 2017-09-07 DIAGNOSIS — I700 Atherosclerosis of aorta: Secondary | ICD-10-CM | POA: Insufficient documentation

## 2017-09-07 DIAGNOSIS — I2510 Atherosclerotic heart disease of native coronary artery without angina pectoris: Secondary | ICD-10-CM | POA: Diagnosis not present

## 2017-09-07 DIAGNOSIS — R109 Unspecified abdominal pain: Secondary | ICD-10-CM | POA: Diagnosis not present

## 2017-09-07 DIAGNOSIS — R627 Adult failure to thrive: Secondary | ICD-10-CM

## 2017-09-07 DIAGNOSIS — R079 Chest pain, unspecified: Secondary | ICD-10-CM | POA: Diagnosis not present

## 2017-09-07 MED ORDER — IOPAMIDOL (ISOVUE-300) INJECTION 61%
80.0000 mL | Freq: Once | INTRAVENOUS | Status: AC | PRN
  Administered 2017-09-07: 80 mL via INTRAVENOUS

## 2017-09-10 NOTE — Progress Notes (Signed)
09/11/2017 3:28 PM   [name] 1930-11-06 [id number]  Referring provider: [name], MD [address] [place], [place] [address]  Chief Complaint  Patient presents with  . New Patient (Initial Visit)    Dysuria referred by Dr. [name]    HPI: Patient is a 83 -year-old Caucasian female who is referred to [name] by, Dr. [name], for recurrent urinary tract infections.  Patient states that she has not had very many urinary tract infections over the last year.  Reviewing her records,  she has had two documented positive cultures in the last 6 months.  One for Klebsiella and 1 for Pseudomonas. She is recently completed her antibiotic for the Pseudomonas infection.     She has been on three antibiotics over the last few weeks.    Her symptoms with a urinary tract infection consist of suprapubic pressure and pain.    She denies dysuria, gross hematuria, suprapubic pain, back pain, abdominal pain or flank pain at this time.  She has not had any recent fevers, chills, nausea or vomiting.   Today, she is experiencing frequency, urgency, burning, nocturia, intermittency, straining to urinate and a weak urinary stream.  Her CATH UA is negative.  PVR is 60 mL.    She does not have a history of nephrolithiasis, GU surgery or GU trauma.   She is not sexually active.  She is postmenopausal.  She admits to constipation.    She does engage in good perineal hygiene. She does not take tub baths.  She does not have incontinence.    Contrast CT performed on 09/07/2017 no adrenal masses. 5 mm low-density mass in the lower pole the right kidney consistent with cysts. No other renal masses, no stones and no hydronephrosis. Bladder is unremarkable.  She is drinking not of water daily.   Recently cut back on sodas.    Reviewed referral notes - see above  PMH: Past Medical History:  Diagnosis Date  . Anxiety   . Barrett esophagus   . Diplopia    history of,hospital  2/26-2/27/10  . Diverticulitis    colonoscopy 1995.  CT 01/31/09, 02/02/09  . Hiatal hernia   . Hiatal hernia    EGD 1998, 2007 ( Dr. Sharlett Iles)  . Hyperlipemia   . Hypertension   . Hypothyroidism   . Osteoporosis DXA 2015  . Personal history of colonic polyps    colonoscpy, removed 1995  . Thyroid carcinoma ([place]) 01/1994  . UTI (urinary tract infection)    Chronic    Surgical History: Past Surgical History:  Procedure Laterality Date  . ABDOMINAL HYSTERECTOMY    . CHOLECYSTECTOMY    . gyn surgery     hysterectomy  . HIP SURGERY  08/1993   due to fracture of right hip  . THYROIDECTOMY  04/1994   thyroid cancer    Home Medications:  Allergies as of 09/11/2017      Reactions   Bentyl [dicyclomine Hcl] Other (See Comments)   abd pain   Ciprofloxacin    Can cause GI upset, esp if taken with metamucil, o/w it is tolerated   Cortisone Acetate    REACTION: nausea   Doxycycline Other (See Comments)   GI upset   Gabapentin    REACTION: severe dizziness   Metoclopramide Hcl    Penicillins    Has patient had a PCN reaction causing immediate rash, facial/tongue/throat swelling, SOB or lightheadedness with hypotension: yes Has patient had a PCN reaction causing  severe rash involving mucus membranes or skin necrosis: yes Has patient had a PCN reaction that required hospitalization no Has patient had a PCN reaction occurring within the last 10 years: no If all of the above answers are "NO", then may proceed with Cephalosporin use.   Promethazine Hcl    REACTION: unspecified   Septra [sulfamethoxazole-trimethoprim] Other (See Comments)   GI upset but not allergy      Medication List       Accurate as of 09/11/17  3:28 PM. Always use your most recent med list.          B COMPLEX-B12 PO Take by mouth. Taken daily   BIOTENE DRY MOUTH MOISTURIZING Soln Use as directed in the mouth or throat.   Cholecalciferol 1000 units Chew Commonly known as:  [employer] VITAMIN D3 Chew 1  tablet (1,000 Units total) by mouth daily.   ciprofloxacin 500 MG tablet Commonly known as:  CIPRO Take 1 tablet (500 mg total) by mouth 2 (two) times daily.   clidinium-chlordiazePOXIDE 5-2.5 MG capsule Commonly known as:  [name] TAKE 1 CAPSULE BY MOUTH EVERY DAY   conjugated estrogens vaginal cream Commonly known as:  PREMARIN Place 1 Applicatorful vaginally daily. Apply 0.48m (pea-sized amount)  just inside the vaginal introitus with a finger-tip every night for two weeks and then Monday, Wednesday and Friday nights.   [employer] ALLERGY RELIEF 10 MG dissolvable tablet Generic drug:  loratadine Take 10 mg by mouth daily as needed for allergies.   HYDROcodone-acetaminophen 5-325 MG tablet Commonly known as:  NORCO/VICODIN TAKE 1/2 TO 1 TABLET BY MOUTH EVERY 6 HOURS AS NEEDED FOR PAIN.   lisinopril 10 MG tablet Commonly known as:  PRINIVIL,ZESTRIL Take 1 tablet (10 mg total) by mouth daily.   LORazepam 0.5 MG tablet Commonly known as:  ATIVAN TAKE ONE-HALF TO ONE TABLET BY MOUTH TWOTIMES DAILY AS NEEDED FOR ANXIETY   MIRALAX powder Generic drug:  polyethylene glycol powder Take 17 g by mouth every evening.   mirtazapine 15 MG tablet Commonly known as:  REMERON TAKE 1 TABLET BY MOUTH EVERY NIGHT AT BEDTIME   MULTI-VITAMINS Tabs Take by mouth.   MULTIVITAMIN PO Take by mouth daily.   nystatin 100000 UNIT/ML suspension Commonly known as:  MYCOSTATIN Take 5 ml by mouth, swish and swallow, 4 times daily after meals.   omeprazole 40 MG capsule Commonly known as:  PRILOSEC One tab by mouth 461m before breakfast   SYNTHROID 88 MCG tablet Generic drug:  levothyroxine Take 1 tablet (88 mcg total) by mouth daily.   VITAMIN B-12 PO Take by mouth.       Allergies:  Allergies  Allergen Reactions  . Bentyl [Dicyclomine Hcl] Other (See Comments)    abd pain  . Ciprofloxacin     Can cause GI upset, esp if taken with metamucil, o/w it is tolerated  . Cortisone Acetate       REACTION: nausea  . Doxycycline Other (See Comments)    GI upset  . Gabapentin     REACTION: severe dizziness  . Metoclopramide Hcl   . Penicillins     Has patient had a PCN reaction causing immediate rash, facial/tongue/throat swelling, SOB or lightheadedness with hypotension: yes Has patient had a PCN reaction causing severe rash involving mucus membranes or skin necrosis: yes Has patient had a PCN reaction that required hospitalization no Has patient had a PCN reaction occurring within the last 10 years: no If all of the above answers are "NO",  then may proceed with Cephalosporin use.   . Promethazine Hcl     REACTION: unspecified  . Septra [Sulfamethoxazole-Trimethoprim] Other (See Comments)    GI upset but not allergy    Family History: Family History  Problem Relation Age of Onset  . Cancer Brother        Pharyngeal   . Heart disease Brother        MI  . Diabetes Brother   . Diabetes Sister        amputation  . Hypertension Mother   . Stroke Father   . Kidney cancer Neg Hx   . Bladder Cancer Neg Hx     Social History:  reports that she has never smoked. She has never used smokeless tobacco. She reports that she does not drink alcohol or use drugs.  ROS: UROLOGY Frequent Urination?: Yes Hard to postpone urination?: Yes Burning/pain with urination?: Yes Get up at night to urinate?: Yes Leakage of urine?: No Urine stream starts and stops?: Yes Trouble starting stream?: No Do you have to strain to urinate?: Yes Blood in urine?: No Urinary tract infection?: Yes Sexually transmitted disease?: No Injury to kidneys or bladder?: No Painful intercourse?: No Weak stream?: Yes Currently pregnant?: No Vaginal bleeding?: No Last menstrual period?: n  Gastrointestinal Nausea?: No Vomiting?: No Indigestion/heartburn?: No Diarrhea?: No Constipation?: No  Constitutional Fever: No Night sweats?: No Weight loss?: Yes Fatigue?: Yes  Skin Skin rash/lesions?:  No Itching?: No  Eyes Blurred vision?: No Double vision?: No  Ears/Nose/Throat Sore throat?: No Sinus problems?: No  Hematologic/Lymphatic Swollen glands?: No Easy bruising?: No  Cardiovascular Leg swelling?: No Chest pain?: No  Respiratory Cough?: No Shortness of breath?: Yes  Endocrine Excessive thirst?: No  Musculoskeletal Back pain?: Yes Joint pain?: No  Neurological Headaches?: No Dizziness?: No  Psychologic Depression?: No Anxiety?: Yes  Physical Exam: BP (!) 149/81   Pulse (!) 103   Ht '5\' 2"'  (1.575 m)   Wt 106 lb 4.8 oz (48.2 kg)   LMP 03/28/1989   BMI 19.44 kg/m   Constitutional: Well nourished. Alert and oriented, No acute distress. HEENT: [place] AT, moist mucus membranes. Trachea midline, no masses. Cardiovascular: No clubbing, cyanosis, or edema. Respiratory: Normal respiratory effort, no increased work of breathing. GI: Abdomen is soft, non tender, non distended, no abdominal masses. Liver and spleen not palpable.  No hernias appreciated.  Stool sample for occult testing is not indicated.   GU: No CVA tenderness.  No bladder fullness or masses.  Atrophic external genitalia, normal pubic hair distribution, no lesions.  Normal urethral meatus, no lesions, no prolapse, no discharge.   No urethral masses, tenderness and/or tenderness. No bladder fullness, tenderness or masses. Pale vagina mucosa, poor estrogen effect, no discharge, no lesions, introitus is narrowed (1 cm aperture) good pelvic support, no cystocele or rectocele noted.  Cervix, uterus and adnexa are surgically absent.  Anus and perineum are without rashes or lesions.    Skin: No rashes, bruises or suspicious lesions. Lymph: No cervical or inguinal adenopathy. Neurologic: Grossly intact, no focal deficits, moving all 4 extremities. Psychiatric: Normal mood and affect.  Laboratory Data: Lab Results  Component Value Date   WBC 5.2 07/31/2017   HGB 11.9 (L) 07/31/2017   HCT 35.4 (L)  07/31/2017   MCV 92.0 07/31/2017   PLT 205.0 07/31/2017    Lab Results  Component Value Date   CREATININE 0.98 07/31/2017    Lab Results  Component Value Date   HGBA1C 5.7 07/31/2017  Lab Results  Component Value Date   TSH 0.72 07/31/2017       Component Value Date/Time   CHOL 197 09/19/2016 1507   HDL 52.50 09/19/2016 1507   CHOLHDL 4 09/19/2016 1507   VLDL 24.0 09/19/2016 1507   LDLCALC 121 (H) 09/19/2016 1507    Lab Results  Component Value Date   AST 21 07/31/2017   Lab Results  Component Value Date   ALT 9 07/31/2017    Urinalysis Negative.  See EPIC.    I have reviewed the labs.   Pertinent Imaging: CLINICAL DATA:  Hx of appendix, GB, and partial hyst. NKI. Pt does have hx of thyroid CA with surgery. No current chest pain or SOB. Pt does c/o LLQ pain for several months. No N/V/D. Pt is non-smoker. Pt also c/o unintentional weight loss-approx. 20 lbs over past 6 months. Pt also c/o poor appetite.  EXAM: CT CHEST, ABDOMEN, AND PELVIS WITH CONTRAST  TECHNIQUE: Multidetector CT imaging of the chest, abdomen and pelvis was performed following the standard protocol during bolus administration of intravenous contrast.  CONTRAST:  71m ISOVUE-300 IOPAMIDOL (ISOVUE-300) INJECTION 61%  COMPARISON:  Chest radiograph, 03/10/2017  FINDINGS: CT CHEST FINDINGS  Cardiovascular: Heart is normal in size and configuration. Mild left coronary artery calcifications. There mild aortic arch and descending thoracic aorta atherosclerosis. Branch vessels are widely patent.  Mediastinum/Nodes: No neck base or axillary masses or adenopathy. No thyroid tissue visualized. No mediastinal or hilar masses. No adenopathy. Trachea and esophagus are unremarkable.  Lungs/Pleura: No pneumonia or pulmonary edema. Small calcified granuloma in the right upper lobe. Mild peripheral interstitial thickening bilaterally. Mild apical pleuroparenchymal scarring.  No pleural effusion or pneumothorax.  CT ABDOMEN PELVIS FINDINGS  Hepatobiliary: No liver mass or focal lesion. Status post cholecystectomy. There is mild intern extrahepatic bile duct dilation, common bile duct measuring 9 mm in greatest dimension with distal tapering. This is presumably chronic.  Pancreas: Atrophy, but no mass or inflammation.  Spleen: Normal in size without focal abnormality.  Adrenals/Urinary Tract: No adrenal masses. 5 mm low-density mass in the lower pole the right kidney consistent with cysts. No other renal masses, no stones and no hydronephrosis. Bladder is unremarkable.  Stomach/Bowel: There are colonic diverticula mostly along the sigmoid, but no evidence of diverticulitis. No colonic wall thickening or inflammation. No evidence of bowel obstruction. Stomach is moderately distended but otherwise unremarkable. Small bowel is within normal limits.  Vascular/Lymphatic: Mild aortic atherosclerotic disease. No adenopathy.  Reproductive: Status post hysterectomy. No adnexal masses.  Other: No abdominal wall hernia or abnormality. No abdominopelvic ascites.  MUSCULOSKELETAL FINDINGS  Status post ORIF of a right proximal femur fracture with screws. Fracture appears well healed. No acute fracture. No osteolytic lesions. Focal bone island in the T11 vertebra. Bones are demineralized. There are degenerative changes throughout the spine.  IMPRESSION: CHEST CT  1. No acute findings within the chest. No evidence of pneumonia or pulmonary edema. 2. Mild left coronary artery calcifications. Mild aortic atherosclerosis. ABDOMEN AND PELVIS CT  1. No acute findings within the abdomen or pelvis. No findings to account for left lower quadrant abdominal pain. 2. There are colonic diverticula particularly along the sigmoid, but no evidence of diverticulitis. 3. Status post cholecystectomy and hysterectomy. Mild chronic intra and extrahepatic  bile duct dilation. 4. Mild aortic atherosclerosis.   Electronically Signed   By: DLajean ManesM.D.   On: 09/07/2017 16:29  I have independently reviewed the films.    Assessment & Plan:  1. Recurrent UTI's  - criteria for recurrent UTI has been met with 2 or more infections in 6 months or 3 or greater infections in one year   - Patient is instructed to increase their water intake until the urine is pale yellow or clear (10 to 12 cups daily)   - probiotics (yogurt, oral pills or vaginal suppositories), take cranberry pills or drink the juice and Vitamin C 1,000 mg daily to acidify the urine should be added to their daily regimen   - avoid soaking in tubs and wipe front to back after urinating   - advised them to have CATH UA's for urinalysis and culture to prevent skin contamination of the specimen  - reviewed symptoms of UTI and advised not to have urine checked or be treated for UTI if not experiencing symptoms  - discussed antibiotic stewardship with the patient    2. Vaginal atrophy  - I explained to the patient that when women go through menopause and her estrogen levels are severely diminished, the normal vaginal flora will change.  This is due to an increase of the vaginal canal's pH. Because of this, the vaginal canal may be colonized by bacteria from the rectum instead of the protective lactobacillus.  This accompanied by the loss of the mucus barrier with vaginal atrophy is a cause of recurrent urinary tract infections.  - In some studies, the use of vaginal estrogen cream has been demonstrated to reduce  recurrent urinary tract infections to one a year.   - Patient was given a prescription for vaginal estrogen cream (Premarin) and instructed to apply 0.51m (pea-sized amount)  just inside the vaginal introitus with a finger-tip every night for two weeks and then Monday, Wednesday and Friday nights.  I explained to the patient that vaginally administered estrogen, which causes  only a slight increase in the blood estrogen levels, have fewer contraindications and adverse systemic effects that oral HT.  - She will follow up in two weeks for an exam.                                            Return in about 2 weeks (around 09/25/2017) for symptom recheck.  These notes generated with voice recognition software. I apologize for typographical errors.  [facility] [address] [place] [phone]

## 2017-09-11 ENCOUNTER — Ambulatory Visit (INDEPENDENT_AMBULATORY_CARE_PROVIDER_SITE_OTHER): Payer: Medicare Other | Admitting: Urology

## 2017-09-11 ENCOUNTER — Encounter: Payer: Self-pay | Admitting: Urology

## 2017-09-11 VITALS — BP 149/81 | HR 103 | Ht 62.0 in | Wt 106.3 lb

## 2017-09-11 DIAGNOSIS — N390 Urinary tract infection, site not specified: Secondary | ICD-10-CM | POA: Diagnosis not present

## 2017-09-11 DIAGNOSIS — N952 Postmenopausal atrophic vaginitis: Secondary | ICD-10-CM

## 2017-09-11 LAB — URINALYSIS, COMPLETE
Bilirubin, UA: NEGATIVE
Glucose, UA: NEGATIVE
Ketones, UA: NEGATIVE
Leukocytes, UA: NEGATIVE
Nitrite, UA: NEGATIVE
Protein, UA: NEGATIVE
Specific Gravity, UA: <1.005 — ABNORMAL LOW (ref 1.005–1.030)
Urobilinogen, Ur: 0.2 mg/dL (ref 0.2–1.0)
pH, UA: 5.5 (ref 5.0–7.5)

## 2017-09-11 MED ORDER — ESTROGENS, CONJUGATED 0.625 MG/GM VA CREA
1.0000 | TOPICAL_CREAM | Freq: Every day | VAGINAL | 12 refills | Status: DC
Start: 2017-09-11 — End: 2020-05-25

## 2017-09-11 NOTE — Progress Notes (Signed)
In and Out Catheterization  Patient is present today for a I & O catheterization due to recurrent uti. Patient was cleaned and prepped in a sterile fashion with betadine and Lidocaine 2% jelly was instilled into the urethra.  A 14FR cath was inserted no complications were noted , 10ml of urine return was noted, urine was yellow in color. A clean urine sample was collected for urinalysis. Bladder was drained and catheter was removed with out difficulty.    Preformed by: Lyndee Hensen CMA

## 2017-09-12 ENCOUNTER — Other Ambulatory Visit: Payer: Self-pay | Admitting: Family Medicine

## 2017-09-13 ENCOUNTER — Telehealth: Payer: Self-pay

## 2017-09-13 NOTE — Telephone Encounter (Signed)
-----   Message from Yetta Flock, MD sent at 09/10/2017 12:38 PM EDT ----- Thanks for the follow up. Yes I'd be happy to get her in the office.   Almyra Free can you reach out to this patient to schedule a clinic visit, first available?  Thanks Richardson Landry  ----- Message ----- From: [name], MD Sent: 09/07/2017   5:04 PM To: Yetta Flock, MD  Can you get this patient scheduled in your office re: early satiety?  No acute findings on the imaging to explain her early satiety and weight loss.  I would like your input.  Many thanks.   Brigitte Pulse

## 2017-09-13 NOTE — Telephone Encounter (Signed)
Line busy x 2, will keep trying to reach patient to schedule.

## 2017-09-14 ENCOUNTER — Telehealth: Payer: Self-pay

## 2017-09-14 NOTE — Telephone Encounter (Signed)
I have not heard back from patient, I will send her a letter in the mail asking her to contact our office to schedule an appointment. Dr. Havery Moros has given okay to schedule her in a banding slot if nothing is available, needs to be seen sooner rather than later.

## 2017-09-16 NOTE — Telephone Encounter (Signed)
[name] if you are having a hard time getting ahold of her you may want to try Dr. Josefine Class office, she has been seen there a few times recently, he asked me to get her in as soon as possible. Thanks

## 2017-09-17 NOTE — Telephone Encounter (Signed)
Called Dr. Josefine Class office asked Clarene Critchley to relay message to Dr. [name] that we have tried to contact patient to schedule appointment but have not heard back from her.

## 2017-09-19 ENCOUNTER — Telehealth: Payer: Self-pay | Admitting: Family Medicine

## 2017-09-19 NOTE — Telephone Encounter (Signed)
See below. Please try to get in touch with patient to have her call the GI clinic. Thanks.

## 2017-09-19 NOTE — Telephone Encounter (Signed)
Copied from [facility] Line #[id number]. Topic: Referral - Status >> Sep 17, 2017 10:19 AM [name], Leone Payor wrote: [facility] called and said that they can not get a hold of patient to schedule a referral appointment. They called to let the provider know about it.

## 2017-09-19 NOTE — Telephone Encounter (Signed)
Patient notified as instructed by telephone and verbalized understanding. Patient stated that she has not felt like going to see the GI doctor with everything else that has been going on. Patient stated that she has an appointment scheduled with Dr. [name] next week and will discuss the GI referral with him at that visit.

## 2017-09-19 NOTE — Telephone Encounter (Signed)
Noted. Thanks.

## 2017-09-20 ENCOUNTER — Ambulatory Visit (INDEPENDENT_AMBULATORY_CARE_PROVIDER_SITE_OTHER): Payer: Medicare Other

## 2017-09-20 ENCOUNTER — Other Ambulatory Visit: Payer: Self-pay | Admitting: Family Medicine

## 2017-09-20 VITALS — BP 130/70 | HR 94 | Temp 97.8°F | Ht 61.5 in | Wt 107.2 lb

## 2017-09-20 DIAGNOSIS — Z0000 Encounter for general adult medical examination without abnormal findings: Secondary | ICD-10-CM | POA: Diagnosis not present

## 2017-09-20 DIAGNOSIS — E559 Vitamin D deficiency, unspecified: Secondary | ICD-10-CM

## 2017-09-20 DIAGNOSIS — E785 Hyperlipidemia, unspecified: Secondary | ICD-10-CM

## 2017-09-20 LAB — LIPID PANEL
Cholesterol: 192 mg/dL (ref 0–200)
HDL: 58.4 mg/dL
LDL Cholesterol: 114 mg/dL — ABNORMAL HIGH (ref 0–99)
NonHDL: 133.41
Total CHOL/HDL Ratio: 3
Triglycerides: 96 mg/dL (ref 0.0–149.0)
VLDL: 19.2 mg/dL (ref 0.0–40.0)

## 2017-09-20 LAB — VITAMIN D 25 HYDROXY (VIT D DEFICIENCY, FRACTURES): VITD: 17.57 ng/mL — ABNORMAL LOW (ref 30.00–100.00)

## 2017-09-20 NOTE — Progress Notes (Signed)
Subjective:   [name] is a 83 y.o. female who presents for Medicare Annual (Subsequent) preventive examination.  Review of Systems:  N/A Cardiac Risk Factors include: advanced age (>23men, >51 women);hypertension     Objective:     Vitals: BP 130/70 (BP Location: Right Arm, Patient Position: Sitting, Cuff Size: Normal)   Pulse 94   Temp 97.8 F (36.6 C) (Tympanic)   Ht 5' 1.5" (1.562 m) Comment: no shoes  Wt 107 lb 4 oz (48.6 kg)   LMP 03/28/1989   SpO2 98%   BMI 19.94 kg/m   Body mass index is 19.94 kg/m.   Tobacco History  Smoking Status  . Never Smoker  Smokeless Tobacco  . Never Used     Counseling given: No   Past Medical History:  Diagnosis Date  . Anxiety   . Barrett esophagus   . Diplopia    history of,hospital 2/26-2/27/10  . Diverticulitis    colonoscopy 1995.  CT 01/31/09, 02/02/09  . Hiatal hernia   . Hiatal hernia    EGD 1998, 2007 ( Dr. Sharlett Iles)  . Hyperlipemia   . Hypertension   . Hypothyroidism   . Osteoporosis DXA 2015  . Personal history of colonic polyps    colonoscpy, removed 1995  . Thyroid carcinoma ([place]) 01/1994  . UTI (urinary tract infection)    Chronic   Past Surgical History:  Procedure Laterality Date  . ABDOMINAL HYSTERECTOMY    . CHOLECYSTECTOMY    . gyn surgery     hysterectomy  . HIP SURGERY  08/1993   due to fracture of right hip  . THYROIDECTOMY  04/1994   thyroid cancer   Family History  Problem Relation Age of Onset  . Cancer Brother        Pharyngeal   . Heart disease Brother        MI  . Diabetes Brother   . Diabetes Sister        amputation  . Hypertension Mother   . Stroke Father   . Kidney cancer Neg Hx   . Bladder Cancer Neg Hx    History  Sexual Activity  . Sexual activity: Not on file    Outpatient Encounter Prescriptions as of 09/20/2017  Medication Sig  . Artificial Saliva (BIOTENE DRY MOUTH MOISTURIZING) SOLN Use as directed in the mouth or throat.  . B Complex Vitamins  (B COMPLEX-B12 PO) Take by mouth. Taken daily  . clidinium-chlordiazePOXIDE ([name]) 5-2.5 MG capsule TAKE 1 CAPSULE BY MOUTH EVERY DAY  . conjugated estrogens (PREMARIN) vaginal cream Place 1 Applicatorful vaginally daily. Apply 0.5mg  (pea-sized amount)  just inside the vaginal introitus with a finger-tip every night for two weeks and then Monday, Wednesday and Friday nights.  . Cyanocobalamin (VITAMIN B-12 PO) Take by mouth.  Marland Kitchen HYDROcodone-acetaminophen (NORCO/VICODIN) 5-325 MG tablet TAKE 1/2 TO 1 TABLET BY MOUTH EVERY 6 HOURS AS NEEDED FOR PAIN.  Marland Kitchen lisinopril (PRINIVIL,ZESTRIL) 10 MG tablet TAKE ONE TABLET BY MOUTH EVERY NIGHT AT BEDTIME  . loratadine ([employer] ALLERGY RELIEF) 10 MG dissolvable tablet Take 10 mg by mouth daily as needed for allergies.   Marland Kitchen LORazepam (ATIVAN) 0.5 MG tablet TAKE ONE-HALF TO ONE TABLET BY MOUTH TWOTIMES DAILY AS NEEDED FOR ANXIETY  . mirtazapine (REMERON) 15 MG tablet TAKE 1 TABLET BY MOUTH EVERY NIGHT AT BEDTIME  . omeprazole (PRILOSEC) 40 MG capsule One tab by mouth 51min before breakfast  . polyethylene glycol powder (MIRALAX) powder Take 17 g by mouth  every evening.   Marland Kitchen SYNTHROID 88 MCG tablet Take 1 tablet (88 mcg total) by mouth daily.  . [DISCONTINUED] nystatin (MYCOSTATIN) 100000 UNIT/ML suspension Take 5 ml by mouth, swish and swallow, 4 times daily after meals.  . Cholecalciferol ([employer] VITAMIN D3) 1000 UNITS CHEW Chew 1 tablet (1,000 Units total) by mouth daily. (Patient not taking: Reported on 09/11/2017)  . [DISCONTINUED] ciprofloxacin (CIPRO) 500 MG tablet Take 1 tablet (500 mg total) by mouth 2 (two) times daily. (Patient not taking: Reported on 09/11/2017)  . [DISCONTINUED] Multiple Vitamin (MULTI-VITAMINS) TABS Take by mouth.  . [DISCONTINUED] Multiple Vitamin (MULTIVITAMIN PO) Take by mouth daily.     Facility-Administered Encounter Medications as of 09/20/2017  Medication  . 0.9 %  sodium chloride infusion    Activities of Daily Living In your  present state of health, do you have any difficulty performing the following activities: 09/20/2017  Hearing? N  Vision? N  Difficulty concentrating or making decisions? N  Walking or climbing stairs? N  Dressing or bathing? N  Doing errands, shopping? N  Preparing Food and eating ? N  Using the Toilet? N  In the past six months, have you accidently leaked urine? N  Do you have problems with loss of bowel control? N  Managing your Medications? N  Managing your Finances? N  Housekeeping or managing your Housekeeping? N  Some recent data might be hidden    Patient Care Team: [name], MD as PCP - General (Family Medicine)    Assessment:     Hearing Screening   125Hz  250Hz  500Hz  1000Hz  2000Hz  3000Hz  4000Hz  6000Hz  8000Hz   Right ear:   0 0 0  0    Left ear:   40 0 0  0      Visual Acuity Screening   Right eye Left eye Both eyes  Without correction: 20/40 20/40 20/40-1  With correction:       Exercise Activities and Dietary recommendations Current Exercise Habits: The patient does not participate in regular exercise at present, Exercise limited by: None identified  Goals    . Increase physical activity          Starting 09/20/17, I will attempt to drink at least 4-5 glasses of water daily.       Fall Risk Fall Risk  09/20/2017 09/19/2016 09/17/2015 08/24/2014 08/14/2013  Falls in the past year? No Yes Yes No Yes  Number falls in past yr: - 2 or more 2 or more - -  Injury with Fall? - No Yes - -   Depression Screen PHQ 2/9 Scores 09/20/2017 09/19/2016 09/17/2015 08/24/2014  PHQ - 2 Score 0 1 0 0  PHQ- 9 Score 0 - - -     Cognitive Function MMSE - Mini Mental State Exam 09/20/2017  Orientation to time 5  Orientation to Place 5  Registration 3  Attention/ Calculation 0  Recall 3  Language- name 2 objects 0  Language- repeat 1  Language- follow 3 step command 3  Language- read & follow direction 0  Write a sentence 0  Copy design 0  Total score 20      PLEASE NOTE: A Mini-Cog screen was completed. Maximum score is 20. A value of 0 denotes this part of Folstein MMSE was not completed or the patient failed this part of the Mini-Cog screening.   Mini-Cog Screening Orientation to Time - Max 5 pts Orientation to Place - Max 5 pts Registration - Max 3 pts Recall -  Max 3 pts Language Repeat - Max 1 pts Language Follow 3 Step Command - Max 3 pts     Immunization History  Administered Date(s) Administered  . Influenza Split 09/05/2011, 09/23/2012  . Influenza Whole 09/20/2007, 08/25/2009, 09/27/2010  . Influenza,inj,Quad PF,6+ Mos 08/14/2013, 08/24/2014, 09/17/2015, 11/02/2016  . Td 02/22/2012  . Zoster 09/13/2010   Screening Tests Health Maintenance  Topic Date Due  . INFLUENZA VACCINE  02/24/2018 (Originally 06/27/2017)  . PNA vac Low Risk Adult (1 of 2 - PCV13) 09/20/2018 (Originally 08/03/1997)  . TETANUS/TDAP  02/21/2022  . DEXA SCAN  Completed      Plan:   I have personally reviewed, addressed, and noted the following in the patient's chart:  A. Medical and social history B. Use of alcohol, tobacco or illicit drugs  C. Current medications and supplements D. Functional ability and status E.  Nutritional status F.  Physical activity G. Advance directives H. List of other physicians I.  Hospitalizations, surgeries, and ER visits in previous 12 months J.  [place] to include hearing, vision, cognitive, depression L. Referrals and appointments - none  In addition, I have reviewed and discussed with patient certain preventive protocols, quality metrics, and best practice recommendations. A written personalized care plan for preventive services as well as general preventive health recommendations were provided to patient.  See attached scanned questionnaire for additional information.   Signed,   Lindell Noe, MHA, BS, LPN Health Coach

## 2017-09-20 NOTE — Patient Instructions (Addendum)
[name] , Thank you for taking time to come for your Medicare Wellness Visit. I appreciate your ongoing commitment to your health goals. Please review the following plan we discussed and let me know if I can assist you in the future.   These are the goals we discussed: Goals    . Increase physical activity          Starting 09/20/17, I will attempt to drink at least 4-5 glasses of water daily.        This is a list of the screening recommended for you and due dates:  Health Maintenance  Topic Date Due  . Flu Shot  02/24/2018*  . Pneumonia vaccines (1 of 2 - PCV13) 09/20/2018*  . Tetanus Vaccine  02/21/2022  . DEXA scan (bone density measurement)  Completed  *Topic was postponed. The date shown is not the original due date.    Preventive Care for Adults  A healthy lifestyle and preventive care can promote health and wellness. Preventive health guidelines for adults include the following key practices.  . A routine yearly physical is a good way to check with your health care provider about your health and preventive screening. It is a chance to share any concerns and updates on your health and to receive a thorough exam.  . Visit your dentist for a routine exam and preventive care every 6 months. Brush your teeth twice a day and floss once a day. Good oral hygiene prevents tooth decay and gum disease.  . The frequency of eye exams is based on your age, health, family medical history, use  of contact lenses, and other factors. Follow your health care provider's recommendations for frequency of eye exams.  . Eat a healthy diet. Foods like vegetables, fruits, whole grains, low-fat dairy products, and lean protein foods contain the nutrients you need without too many calories. Decrease your intake of foods high in solid fats, added sugars, and salt. Eat the right amount of calories for you. Get information about a proper diet from your health care provider, if necessary.  . Regular  physical exercise is one of the most important things you can do for your health. Most adults should get at least 150 minutes of moderate-intensity exercise (any activity that increases your heart rate and causes you to sweat) each week. In addition, most adults need muscle-strengthening exercises on 2 or more days a week.  Silver Sneakers may be a benefit available to you. To determine eligibility, you may visit the website: www.silversneakers.com or contact program at (854)883-6225 Mon-Fri between 8AM-8PM.   . Maintain a healthy weight. The body mass index (BMI) is a screening tool to identify possible weight problems. It provides an estimate of body fat based on height and weight. Your health care provider can find your BMI and can help you achieve or maintain a healthy weight.   For adults 20 years and older: ? A BMI below 18.5 is considered underweight. ? A BMI of 18.5 to 24.9 is normal. ? A BMI of 25 to 29.9 is considered overweight. ? A BMI of 30 and above is considered obese.   . Maintain normal blood lipids and cholesterol levels by exercising and minimizing your intake of saturated fat. Eat a balanced diet with plenty of fruit and vegetables. Blood tests for lipids and cholesterol should begin at age 38 and be repeated every 5 years. If your lipid or cholesterol levels are high, you are over 50, or you are at high  risk for heart disease, you may need your cholesterol levels checked more frequently. Ongoing high lipid and cholesterol levels should be treated with medicines if diet and exercise are not working.  . If you smoke, find out from your health care provider how to quit. If you do not use tobacco, please do not start.  . If you choose to drink alcohol, please do not consume more than 2 drinks per day. One drink is considered to be 12 ounces (355 mL) of beer, 5 ounces (148 mL) of wine, or 1.5 ounces (44 mL) of liquor.  . If you are 11-35 years old, ask your health care provider if  you should take aspirin to prevent strokes.  . Use sunscreen. Apply sunscreen liberally and repeatedly throughout the day. You should seek shade when your shadow is shorter than you. Protect yourself by wearing long sleeves, pants, a wide-brimmed hat, and sunglasses year round, whenever you are outdoors.  . Once a month, do a whole body skin exam, using a mirror to look at the skin on your back. Tell your health care provider of new moles, moles that have irregular borders, moles that are larger than a pencil eraser, or moles that have changed in shape or color.

## 2017-09-20 NOTE — Progress Notes (Signed)
PCP notes:   Health maintenance:  Flu vaccine - pt desires to have vaccine at CPE  Abnormal screenings:   Hearing - failed  Hearing Screening   125Hz  250Hz  500Hz  1000Hz  2000Hz  3000Hz  4000Hz  6000Hz  8000Hz   Right ear:   0 0 0  0    Left ear:   40 0 0  0     Patient concerns:   None  Nurse concerns:  None  Next PCP appt:   09/25/17 @ 1415 I reviewed health advisor's note, was available for consultation on the day of service listed in this note, and agree with documentation and plan. Elsie Stain, MD.

## 2017-09-20 NOTE — Progress Notes (Signed)
Pre visit review using our clinic review tool, if applicable. No additional management support is needed unless otherwise documented below in the visit note. 

## 2017-09-25 ENCOUNTER — Ambulatory Visit (INDEPENDENT_AMBULATORY_CARE_PROVIDER_SITE_OTHER): Payer: Medicare Other | Admitting: Family Medicine

## 2017-09-25 ENCOUNTER — Encounter: Payer: Self-pay | Admitting: Family Medicine

## 2017-09-25 VITALS — BP 130/70 | HR 94 | Temp 97.8°F | Ht 61.5 in | Wt 107.2 lb

## 2017-09-25 DIAGNOSIS — Z7189 Other specified counseling: Secondary | ICD-10-CM

## 2017-09-25 DIAGNOSIS — Z23 Encounter for immunization: Secondary | ICD-10-CM | POA: Diagnosis not present

## 2017-09-25 DIAGNOSIS — R634 Abnormal weight loss: Secondary | ICD-10-CM

## 2017-09-25 DIAGNOSIS — Z0000 Encounter for general adult medical examination without abnormal findings: Secondary | ICD-10-CM

## 2017-09-25 DIAGNOSIS — R300 Dysuria: Secondary | ICD-10-CM

## 2017-09-25 NOTE — Progress Notes (Signed)
09/26/2017 2:48 PM   [name] [facility] [birth year] [id number]  Referring provider: [name], MD [address] [place], [place] [address]  Chief Complaint  Patient presents with  . Recurrent UTI    HPI: 70 WF with recurrent urinary tract infections and vaginal atrophy who presents today for 2 week follow-up after initiating vaginal estrogen cream.  Background history Patient is a 83 -year-old Caucasian female who is referred to [name] by, Dr. [name], for recurrent urinary tract infections.  Patient states that she has not had very many urinary tract infections over the last year.  Reviewing her records,  she has had two documented positive cultures in the last 6 months.  One for Klebsiella and 1 for Pseudomonas. She is recently completed her antibiotic for the Pseudomonas infection.  She has been on three antibiotics over the last few weeks.  Her symptoms with a urinary tract infection consist of suprapubic pressure and pain.  She denies dysuria, gross hematuria, suprapubic pain, back pain, abdominal pain or flank pain at this time.  She has not had any recent fevers, chills, nausea or vomiting.  Today, she is experiencing frequency, urgency, burning, nocturia, intermittency, straining to urinate and a weak urinary stream.  Her CATH UA is negative.  PVR is 60 mL.   She does not have a history of nephrolithiasis, GU surgery or GU trauma.  She is not sexually active.  She is postmenopausal.  She admits to constipation.   She does engage in good perineal hygiene. She does not take tub baths.  She does not have incontinence.   Contrast CT performed on 09/07/2017 no adrenal masses. 5 mm low-density mass in the lower pole the right kidney consistent with cysts. No other renal masses, no stones and no hydronephrosis. Bladder is unremarkable.  She is drinking not of water daily.   Recently cut back on sodas.  Reviewed referral notes - see above  At her visit 2 weeks ago, she was initiated  on vaginal estrogen cream, encouraged to increase her water intake to 1.5 L daily, encouraged to take a daily probiotic, encouraged to drink cranberry juice and/or take cranberry tablets daily and take vitamin C 1000 mg daily.  Today, she is experiencing urgency x 0-3, frequency x 4-7, not restricting fluids to avoid visits to the restroom, is engaging in toilet mapping, incontinence x 0-3 and nocturia x 0-3.    She states she has noticed a decrease in the vaginal irritation since starting the Premarin cream. She's not had a UTI since her visit with [name] 2 weeks ago. She denies suprapubic pain and gross hematuria. She has not had fevers, chills, nausea or vomiting.  PMH: Past Medical History:  Diagnosis Date  . Anxiety   . Barrett esophagus   . Diplopia    history of,hospital 2/26-2/27/10  . Diverticulitis    colonoscopy 1995.  CT 01/31/09, 02/02/09  . Hiatal hernia   . Hiatal hernia    EGD 1998, 2007 ( Dr. Sharlett Iles)  . Hyperlipemia   . Hypertension   . Hypothyroidism   . Osteoporosis DXA 2015  . Personal history of colonic polyps    colonoscpy, removed 1995  . Thyroid carcinoma ([place]) 01/1994  . UTI (urinary tract infection)    Chronic    Surgical History: Past Surgical History:  Procedure Laterality Date  . ABDOMINAL HYSTERECTOMY    . CHOLECYSTECTOMY    . gyn surgery     hysterectomy  . HIP SURGERY  08/1993   due to fracture of right hip  . THYROIDECTOMY  04/1994   thyroid cancer    Home Medications:  Allergies as of 09/26/2017      Reactions   Bentyl [dicyclomine Hcl] Other (See Comments)   abd pain   Ciprofloxacin    Can cause GI upset, esp if taken with metamucil, o/w it is tolerated   Cortisone Acetate    REACTION: nausea   Doxycycline Other (See Comments)   GI upset   Gabapentin    REACTION: severe dizziness   Metoclopramide Hcl    Penicillins    Has patient had a PCN reaction causing immediate rash, facial/tongue/throat swelling, SOB or lightheadedness with  hypotension: yes Has patient had a PCN reaction causing severe rash involving mucus membranes or skin necrosis: yes Has patient had a PCN reaction that required hospitalization no Has patient had a PCN reaction occurring within the last 10 years: no If all of the above answers are "NO", then may proceed with Cephalosporin use.   Promethazine Hcl    REACTION: unspecified   Septra [sulfamethoxazole-trimethoprim] Other (See Comments)   GI upset but not allergy      Medication List       Accurate as of 09/26/17  2:48 PM. Always use your most recent med list.          B COMPLEX-B12 PO Take by mouth. Taken daily   BIOTENE DRY MOUTH MOISTURIZING Soln Use as directed in the mouth or throat.   Cholecalciferol 1000 units Chew Commonly known as:  [employer] VITAMIN D3 Chew 1 tablet (1,000 Units total) by mouth daily.   clidinium-chlordiazePOXIDE 5-2.5 MG capsule Commonly known as:  [name] TAKE 1 CAPSULE BY MOUTH EVERY DAY   conjugated estrogens vaginal cream Commonly known as:  PREMARIN Place 1 Applicatorful vaginally daily. Apply 0.5mg  (pea-sized amount)  just inside the vaginal introitus with a finger-tip every night for two weeks and then Monday, Wednesday and Friday nights.   conjugated estrogens vaginal cream Commonly known as:  PREMARIN Place 1 Applicatorful vaginally daily. Apply 0.5mg  (pea-sized amount)  just inside the vaginal introitus with a finger-tip every night for two weeks and then Monday, Wednesday and Friday nights.   [employer]S ALLERGY RELIEF 10 MG dissolvable tablet Generic drug:  loratadine Take 10 mg by mouth daily as needed for allergies.   estradiol 0.1 MG/GM vaginal cream Commonly known as:  ESTRACE VAGINAL Apply 0.5mg  (pea-sized amount)  just inside the vaginal introitus with a finger-tip every night for two weeks and then Monday, Wednesday and Friday nights.   HYDROcodone-acetaminophen 5-325 MG tablet Commonly known as:  NORCO/VICODIN TAKE 1/2 TO 1 TABLET BY MOUTH  EVERY 6 HOURS AS NEEDED FOR PAIN.   lisinopril 10 MG tablet Commonly known as:  PRINIVIL,ZESTRIL TAKE ONE TABLET BY MOUTH EVERY NIGHT AT BEDTIME   LORazepam 0.5 MG tablet Commonly known as:  ATIVAN TAKE ONE-HALF TO ONE TABLET BY MOUTH TWOTIMES DAILY AS NEEDED FOR ANXIETY   MIRALAX powder Generic drug:  polyethylene glycol powder Take 17 g by mouth every evening.   mirtazapine 15 MG tablet Commonly known as:  REMERON TAKE 1 TABLET BY MOUTH EVERY NIGHT AT BEDTIME   omeprazole 40 MG capsule Commonly known as:  PRILOSEC One tab by mouth 44min before breakfast   SYNTHROID 88 MCG tablet Generic drug:  levothyroxine Take 1 tablet (88 mcg total) by mouth daily.   VITAMIN B-12 PO Take by mouth.       Allergies:  Allergies  Allergen Reactions  . Bentyl [Dicyclomine Hcl] Other (See Comments)    abd pain  . Ciprofloxacin     Can cause GI upset, esp if taken with metamucil, o/w it is tolerated  . Cortisone Acetate     REACTION: nausea  . Doxycycline Other (See Comments)    GI upset  . Gabapentin     REACTION: severe dizziness  . Metoclopramide Hcl   . Penicillins     Has patient had a PCN reaction causing immediate rash, facial/tongue/throat swelling, SOB or lightheadedness with hypotension: yes Has patient had a PCN reaction causing severe rash involving mucus membranes or skin necrosis: yes Has patient had a PCN reaction that required hospitalization no Has patient had a PCN reaction occurring within the last 10 years: no If all of the above answers are "NO", then may proceed with Cephalosporin use.   . Promethazine Hcl     REACTION: unspecified  . Septra [Sulfamethoxazole-Trimethoprim] Other (See Comments)    GI upset but not allergy    Family History: Family History  Problem Relation Age of Onset  . Cancer Brother        Pharyngeal   . Heart disease Brother        MI  . Diabetes Brother   . Diabetes Sister        amputation  . Hypertension Mother   .  Stroke Father   . Kidney cancer Neg Hx   . Bladder Cancer Neg Hx     Social History:  reports that she has never smoked. She has never used smokeless tobacco. She reports that she does not drink alcohol or use drugs.  ROS: UROLOGY Frequent Urination?: Yes Hard to postpone urination?: No Burning/pain with urination?: No Get up at night to urinate?: No Leakage of urine?: No Urine stream starts and stops?: No Trouble starting stream?: No Do you have to strain to urinate?: No Blood in urine?: No Urinary tract infection?: Yes Sexually transmitted disease?: No Injury to kidneys or bladder?: No Painful intercourse?: No Weak stream?: No Currently pregnant?: No Vaginal bleeding?: No Last menstrual period?: n  Gastrointestinal Nausea?: No Vomiting?: No Indigestion/heartburn?: No Diarrhea?: No Constipation?: No  Constitutional Fever: No Night sweats?: No Weight loss?: No Fatigue?: No  Skin Skin rash/lesions?: No Itching?: No  Eyes Blurred vision?: No Double vision?: No  Ears/Nose/Throat Sore throat?: No Sinus problems?: No  Hematologic/Lymphatic Swollen glands?: No Easy bruising?: No  Cardiovascular Leg swelling?: No Chest pain?: No  Respiratory Cough?: No Shortness of breath?: No  Endocrine Excessive thirst?: No  Musculoskeletal Back pain?: Yes Joint pain?: No  Neurological Headaches?: No Dizziness?: No  Psychologic Depression?: No Anxiety?: No  Physical Exam: BP 113/71 (BP Location: Right Arm, Patient Position: Sitting, Cuff Size: Normal)   Pulse 93   Ht 5' 1.5" (1.562 m)   Wt 105 lb 8 oz (47.9 kg)   LMP 03/28/1989   BMI 19.61 kg/m   Constitutional: Well nourished. Alert and oriented, No acute distress. HEENT: [place] AT, moist mucus membranes. Trachea midline, no masses. Cardiovascular: No clubbing, cyanosis, or edema. Respiratory: Normal respiratory effort, no increased work of breathing. GI: Abdomen is soft, non tender, non distended,  no abdominal masses. Liver and spleen not palpable.  No hernias appreciated.  Stool sample for occult testing is not indicated.   GU: No CVA tenderness.  No bladder fullness or masses.  Atrophic external genitalia, normal pubic hair distribution, no lesions.  Normal urethral meatus, no lesions, no prolapse,  no discharge.   No urethral masses, tenderness and/or tenderness. No bladder fullness, tenderness or masses. Pale vagina mucosa, poor estrogen effect, no discharge, no lesions, introitus is narrowed (1 cm aperture) good pelvic support, no cystocele or rectocele noted.  Cervix, uterus and adnexa are surgically absent.  Anus and perineum are without rashes or lesions.    Skin: No rashes, bruises or suspicious lesions. Lymph: No cervical or inguinal adenopathy. Neurologic: Grossly intact, no focal deficits, moving all 4 extremities. Psychiatric: Normal mood and affect.  Laboratory Data: Lab Results  Component Value Date   WBC 5.2 07/31/2017   HGB 11.9 (L) 07/31/2017   HCT 35.4 (L) 07/31/2017   MCV 92.0 07/31/2017   PLT 205.0 07/31/2017    Lab Results  Component Value Date   CREATININE 0.98 07/31/2017    Lab Results  Component Value Date   HGBA1C 5.7 07/31/2017    Lab Results  Component Value Date   TSH 0.72 07/31/2017       Component Value Date/Time   CHOL 192 09/20/2017 1348   HDL 58.40 09/20/2017 1348   CHOLHDL 3 09/20/2017 1348   VLDL 19.2 09/20/2017 1348   LDLCALC 114 (H) 09/20/2017 1348    Lab Results  Component Value Date   AST 21 07/31/2017   Lab Results  Component Value Date   ALT 9 07/31/2017    I have reviewed the labs.   Pertinent Imaging: CLINICAL DATA:  Hx of appendix, GB, and partial hyst. NKI. Pt does have hx of thyroid CA with surgery. No current chest pain or SOB. Pt does c/o LLQ pain for several months. No N/V/D. Pt is non-smoker. Pt also c/o unintentional weight loss-approx. 20 lbs over past 6 months. Pt also c/o poor  appetite.  EXAM: CT CHEST, ABDOMEN, AND PELVIS WITH CONTRAST  TECHNIQUE: Multidetector CT imaging of the chest, abdomen and pelvis was performed following the standard protocol during bolus administration of intravenous contrast.  CONTRAST:  67mL ISOVUE-300 IOPAMIDOL (ISOVUE-300) INJECTION 61%  COMPARISON:  Chest radiograph, 03/10/2017  FINDINGS: CT CHEST FINDINGS  Cardiovascular: Heart is normal in size and configuration. Mild left coronary artery calcifications. There mild aortic arch and descending thoracic aorta atherosclerosis. Branch vessels are widely patent.  Mediastinum/Nodes: No neck base or axillary masses or adenopathy. No thyroid tissue visualized. No mediastinal or hilar masses. No adenopathy. Trachea and esophagus are unremarkable.  Lungs/Pleura: No pneumonia or pulmonary edema. Small calcified granuloma in the right upper lobe. Mild peripheral interstitial thickening bilaterally. Mild apical pleuroparenchymal scarring. No pleural effusion or pneumothorax.  CT ABDOMEN PELVIS FINDINGS  Hepatobiliary: No liver mass or focal lesion. Status post cholecystectomy. There is mild intern extrahepatic bile duct dilation, common bile duct measuring 9 mm in greatest dimension with distal tapering. This is presumably chronic.  Pancreas: Atrophy, but no mass or inflammation.  Spleen: Normal in size without focal abnormality.  Adrenals/Urinary Tract: No adrenal masses. 5 mm low-density mass in the lower pole the right kidney consistent with cysts. No other renal masses, no stones and no hydronephrosis. Bladder is unremarkable.  Stomach/Bowel: There are colonic diverticula mostly along the sigmoid, but no evidence of diverticulitis. No colonic wall thickening or inflammation. No evidence of bowel obstruction. Stomach is moderately distended but otherwise unremarkable. Small bowel is within normal limits.  Vascular/Lymphatic: Mild aortic  atherosclerotic disease. No adenopathy.  Reproductive: Status post hysterectomy. No adnexal masses.  Other: No abdominal wall hernia or abnormality. No abdominopelvic ascites.  MUSCULOSKELETAL FINDINGS  Status post ORIF of  a right proximal femur fracture with screws. Fracture appears well healed. No acute fracture. No osteolytic lesions. Focal bone island in the T11 vertebra. Bones are demineralized. There are degenerative changes throughout the spine.  IMPRESSION: CHEST CT  1. No acute findings within the chest. No evidence of pneumonia or pulmonary edema. 2. Mild left coronary artery calcifications. Mild aortic atherosclerosis. ABDOMEN AND PELVIS CT  1. No acute findings within the abdomen or pelvis. No findings to account for left lower quadrant abdominal pain. 2. There are colonic diverticula particularly along the sigmoid, but no evidence of diverticulitis. 3. Status post cholecystectomy and hysterectomy. Mild chronic intra and extrahepatic bile duct dilation. 4. Mild aortic atherosclerosis.   Electronically Signed   By: Lajean Manes M.D.   On: 09/07/2017 16:29   Assessment & Plan:    1. Recurrent UTI's  - Reviewed UTI prevention strategies with the patient  - Asked the patient to contact us with any UTI symptoms  2. Vaginal atrophy  - continue the vaginal estrogen cream  - She will follow up in 3 months for an exam.                                            Return in about 3 months (around 12/27/2017) for OAB questionnaire, PVR and exam.  These notes generated with voice recognition software. I apologize for typographical errors.  Zara Council, [facility] [address], [place], [place] [address] [phone]

## 2017-09-25 NOTE — Patient Instructions (Addendum)
At some point, let us know about [name]' phone number.  I think it is a good idea to see the GI clinic.  I'll check on that.  I'll await the notes from the urology appointment tomorrow.  Take care.  Glad to see you.

## 2017-09-25 NOTE — Progress Notes (Signed)
Weight 107 at home.  She started taking ensure in the meantime.  That was in addition to her meals which were smaller than prev.  Her appetite is still low.  Her weight is still down from baseline but stable over the last few months.  We talked about GI eval and she wanted to avoid EGD if possible.  "It was hard on me last time."  She is swallowing better now, after EGD.   She has had mult illness and her sister in law had recently died.  She has had a lot of upheaval.  I don't know how much that contributes to the weight loss.   Prev imaging and labs w/o clear cut cause seen to explain the weight loss.  D/w pt.    She has f/u urology tomorrow.  She has some external irritation after taking abx recently.  She is using premarin cream with some relief.  She doesn't have discharge.  The prev abd pain is resolved.   Flu vaccine - d/w pt.   Hearing - failed. D/w pt.   Declined hearing aids.  Her ears were able to "pop" and she started hearing better in the meantime.   Advance directive- friend Jerrye Bushy designated if patient were incapacitated.   PMH and SH reviewed  ROS: Per HPI unless specifically indicated in ROS section   Meds, vitals, and allergies reviewed.   GEN: nad, alert and oriented, thin elderly lady.  HEENT: mucous membranes moist NECK: supple w/o LA CV: rrr.  PULM: ctab, no inc wob ABD: soft, +bs EXT: no edema SKIN: no acute rash

## 2017-09-26 ENCOUNTER — Encounter: Payer: Self-pay | Admitting: Urology

## 2017-09-26 ENCOUNTER — Ambulatory Visit: Payer: Medicare Other | Admitting: Urology

## 2017-09-26 VITALS — BP 113/71 | HR 93 | Ht 61.5 in | Wt 105.5 lb

## 2017-09-26 DIAGNOSIS — N390 Urinary tract infection, site not specified: Secondary | ICD-10-CM | POA: Diagnosis not present

## 2017-09-26 DIAGNOSIS — N952 Postmenopausal atrophic vaginitis: Secondary | ICD-10-CM

## 2017-09-26 DIAGNOSIS — Z0000 Encounter for general adult medical examination without abnormal findings: Secondary | ICD-10-CM | POA: Insufficient documentation

## 2017-09-26 MED ORDER — ESTROGENS, CONJUGATED 0.625 MG/GM VA CREA
1.0000 | TOPICAL_CREAM | Freq: Every day | VAGINAL | 12 refills | Status: DC
Start: 2017-09-26 — End: 2018-01-08

## 2017-09-26 MED ORDER — ESTRADIOL 0.1 MG/GM VA CREA: TOPICAL_CREAM | VAGINAL | 12 refills | Status: DC

## 2017-09-26 NOTE — Assessment & Plan Note (Signed)
Advance directive- friend Jerrye Bushy designated if patient were incapacitated.

## 2017-09-26 NOTE — Assessment & Plan Note (Signed)
We talked about GI eval and she wanted to avoid EGD if possible.  "It was hard on me last time."  She is swallowing better now, after EGD.   I would like GI input to see if they had any explanation or treatment for her decrease in appetite.  She has had mult illness and her sister in law had recently died.  She has had a lot of upheaval.  I don't know how much that contributes to the weight loss.   Prev imaging and labs w/o clear cut cause seen to explain the weight loss.  D/w pt.   Continue regular meals with ensure added on.   She agrees.  >25 minutes spent in face to face time with patient, >50% spent in counselling or coordination of care.

## 2017-09-26 NOTE — Assessment & Plan Note (Signed)
Will await urology notes, with appreciation.

## 2017-09-26 NOTE — Assessment & Plan Note (Signed)
Flu vaccine - d/w pt.   Hearing - failed. D/w pt.   Declined hearing aids.  Her ears were able to "pop" and she started hearing better in the meantime.

## 2017-10-03 ENCOUNTER — Other Ambulatory Visit: Payer: Self-pay | Admitting: Family Medicine

## 2017-10-03 NOTE — Telephone Encounter (Signed)
Electronic refill Last refill 09/19/16 #90/3 Last office visit 09/25/17

## 2017-10-04 NOTE — Telephone Encounter (Signed)
Sent. Thanks.   

## 2017-10-08 ENCOUNTER — Other Ambulatory Visit: Payer: Self-pay | Admitting: Family Medicine

## 2017-10-08 NOTE — Telephone Encounter (Signed)
Electronic refill Last refill 06/20/17 #40/2 Last office visit 09/25/17

## 2017-10-09 NOTE — Telephone Encounter (Signed)
Rx called to pharmacy as instructed. 

## 2017-11-13 ENCOUNTER — Other Ambulatory Visit: Payer: Self-pay | Admitting: Family Medicine

## 2017-11-13 DIAGNOSIS — G8929 Other chronic pain: Secondary | ICD-10-CM

## 2017-11-13 NOTE — Telephone Encounter (Signed)
Electronic refill request. Hydrocodone Acetaminophen Last office visit:   09/25/2017 Last Filled:    180 tablet 0 08/17/2017  Please advise.

## 2017-11-14 DIAGNOSIS — G8929 Other chronic pain: Secondary | ICD-10-CM | POA: Insufficient documentation

## 2017-11-14 NOTE — Telephone Encounter (Signed)
Indication for chronic opioid: Chronic back pain. H/o hip fracture. Medication and dose: hydrocodone 5/325mg  # pills per month: ~60 tabs per month, usually 180 per 3 months.   Last UDS date: 08/17/17 Pain contract signed (Y/N): y Date narcotic database last reviewed (include red flags): 11/14/17  Printed.  Needs 3 month f/u OV re: pain meds after this rx, ie for next fill.  Please get update from patient.  How is her weight?  Thanks.

## 2017-11-14 NOTE — Telephone Encounter (Signed)
Patient notified as instructed by telephone and verbalized understanding. Patient stated that she is up 1-2 pounds and is eating better.

## 2017-11-14 NOTE — Telephone Encounter (Signed)
Patient notified as instructed by telephone. Patient aware that script is up front ready for pickup. Patient will schedule her follow-up appointment when she comes in to pick her script up.Marland Kitchen

## 2017-11-14 NOTE — Telephone Encounter (Signed)
Noted.  Thanks.  Glad to hear.  ? ?

## 2018-01-03 ENCOUNTER — Other Ambulatory Visit: Payer: Self-pay | Admitting: *Deleted

## 2018-01-03 MED ORDER — LISINOPRIL 10 MG PO TABS: 10.0000 mg | ORAL_TABLET | Freq: Every day | ORAL | 1 refills | Status: DC

## 2018-01-07 NOTE — Progress Notes (Signed)
01/08/2018 1:42 PM   [name] 1930-03-06 [id number]  Referring provider: [name], MD [address] [place], [place] [address]  Chief Complaint  Patient presents with  . Follow-up  . Urinary Tract Infection    HPI: 55 WF with recurrent urinary tract infections and vaginal atrophy who presents today for 3 month follow-up.    Background history Patient is a 84 -year-old Caucasian female who is referred to [name] by, Dr. [name], for recurrent urinary tract infections.  Patient states that she has not had very many urinary tract infections over the last year.  Reviewing her records,  she has had two documented positive cultures in the last 6 months.  One for Klebsiella and 1 for Pseudomonas. She is recently completed her antibiotic for the Pseudomonas infection.  She has been on three antibiotics over the last few weeks.  Her symptoms with a urinary tract infection consist of suprapubic pressure and pain.  She denies dysuria, gross hematuria, suprapubic pain, back pain, abdominal pain or flank pain at this time.  She has not had any recent fevers, chills, nausea or vomiting.  Today, she is experiencing frequency, urgency, burning, nocturia, intermittency, straining to urinate and a weak urinary stream.  Her CATH UA is negative.  PVR is 60 mL.   She does not have a history of nephrolithiasis, GU surgery or GU trauma.  She is not sexually active.  She is postmenopausal.  She admits to constipation.   She does engage in good perineal hygiene. She does not take tub baths.  She does not have incontinence.   Contrast CT performed on 09/07/2017 no adrenal masses. 5 mm low-density mass in the lower pole the right kidney consistent with cysts. No other renal masses, no stones and no hydronephrosis. Bladder is unremarkable.  She is drinking not of water daily.   Recently cut back on sodas.  Reviewed referral notes - see above  At her visit on 09/11/2017, she was initiated on vaginal  estrogen cream, encouraged to increase her water intake to 1.5 L daily, encouraged to take a daily probiotic, encouraged to drink cranberry juice and/or take cranberry tablets daily and take vitamin C 1000 mg daily.  At her visit on 09/26/2017, she was experiencing urgency x 0-3, frequency x 4-7, not restricting fluids to avoid visits to the restroom, is engaging in toilet mapping, incontinence x 0-3 and nocturia x 0-3.    She states she has noticed a decrease in the vaginal irritation since starting the Premarin cream. She's not had a UTI since her visit with [name] 2 weeks ago. She denies suprapubic pain and gross hematuria. She has not had fevers, chills, nausea or vomiting.  Today, she is complaining of dysuria, suprapubic pain and legs aching.  She denies fevers, chills, nausea and vomiting.  She denies any hematuria at this time.  Her cath UA is positive for greater than 30 WBCs, 10 RBCs, many bacteria and nitrate positive.    PMH: Past Medical History:  Diagnosis Date  . Anxiety   . Barrett esophagus   . Diplopia    history of,hospital 2/26-2/27/10  . Diverticulitis    colonoscopy 1995.  CT 01/31/09, 02/02/09  . Hiatal hernia   . Hiatal hernia    EGD 1998, 2007 ( Dr. Sharlett Iles)  . Hyperlipemia   . Hypertension   . Hypothyroidism   . Osteoporosis DXA 2015  . Personal history of colonic polyps    colonoscpy, removed 1995  .  Thyroid carcinoma ([place]) 01/1994  . UTI (urinary tract infection)    Chronic    Surgical History: Past Surgical History:  Procedure Laterality Date  . ABDOMINAL HYSTERECTOMY    . CHOLECYSTECTOMY    . gyn surgery     hysterectomy  . HIP SURGERY  08/1993   due to fracture of right hip  . THYROIDECTOMY  04/1994   thyroid cancer    Home Medications:  Allergies as of 01/08/2018      Reactions   Bentyl [dicyclomine Hcl] Other (See Comments)   abd pain   Ciprofloxacin    Can cause GI upset, esp if taken with metamucil, o/w it is tolerated   Cortisone Acetate     REACTION: nausea   Doxycycline Other (See Comments)   GI upset   Gabapentin    REACTION: severe dizziness   Metoclopramide Hcl    Penicillins    Has patient had a PCN reaction causing immediate rash, facial/tongue/throat swelling, SOB or lightheadedness with hypotension: yes Has patient had a PCN reaction causing severe rash involving mucus membranes or skin necrosis: yes Has patient had a PCN reaction that required hospitalization no Has patient had a PCN reaction occurring within the last 10 years: no If all of the above answers are "NO", then may proceed with Cephalosporin use.   Promethazine Hcl    REACTION: unspecified   Septra [sulfamethoxazole-trimethoprim] Other (See Comments)   GI upset but not allergy      Medication List        Accurate as of 01/08/18  1:42 PM. Always use your most recent med list.          B COMPLEX-B12 PO Take by mouth. Taken daily   BIOTENE DRY MOUTH MOISTURIZING Soln Use as directed in the mouth or throat.   Cholecalciferol 1000 units Chew Commonly known as:  [employer] VITAMIN D3 Chew 1 tablet (1,000 Units total) by mouth daily.   clidinium-chlordiazePOXIDE 5-2.5 MG capsule Commonly known as:  [name] TAKE 1 CAPSULE BY MOUTH EVERY DAY   conjugated estrogens vaginal cream Commonly known as:  PREMARIN Place 1 Applicatorful vaginally daily. Apply 0.5mg  (pea-sized amount)  just inside the vaginal introitus with a finger-tip every night for two weeks and then Monday, Wednesday and Friday nights.   [employer]S ALLERGY RELIEF 10 MG dissolvable tablet Generic drug:  loratadine Take 10 mg by mouth daily as needed for allergies.   HYDROcodone-acetaminophen 5-325 MG tablet Commonly known as:  NORCO/VICODIN TAKE ONE-HALF TO ONE(1) TABLET BY MOUTH EVERY 6 HOURS AS NEEDED FOR PAIN   lisinopril 10 MG tablet Commonly known as:  PRINIVIL,ZESTRIL Take 1 tablet (10 mg total) by mouth at bedtime.   LORazepam 0.5 MG tablet Commonly known as:  ATIVAN TAKE ONE-HALF  TO ONE TABLET BY MOUTH TWOTIMES DAILY AS NEEDED FOR ANXIETY   MIRALAX powder Generic drug:  polyethylene glycol powder Take 17 g by mouth every evening.   mirtazapine 15 MG tablet Commonly known as:  REMERON TAKE ONE TABLET BY MOUTH EVERY NIGHT AT BEDTIME   omeprazole 40 MG capsule Commonly known as:  PRILOSEC TAKE 1 CAPSULE BY MOUTH 45 MINUTES BEFORE BREAKFAST   SYNTHROID 88 MCG tablet Generic drug:  levothyroxine Take 1 tablet (88 mcg total) by mouth daily.   VITAMIN B-12 PO Take by mouth.       Allergies:  Allergies  Allergen Reactions  . Bentyl [Dicyclomine Hcl] Other (See Comments)    abd pain  . Ciprofloxacin  Can cause GI upset, esp if taken with metamucil, o/w it is tolerated  . Cortisone Acetate     REACTION: nausea  . Doxycycline Other (See Comments)    GI upset  . Gabapentin     REACTION: severe dizziness  . Metoclopramide Hcl   . Penicillins     Has patient had a PCN reaction causing immediate rash, facial/tongue/throat swelling, SOB or lightheadedness with hypotension: yes Has patient had a PCN reaction causing severe rash involving mucus membranes or skin necrosis: yes Has patient had a PCN reaction that required hospitalization no Has patient had a PCN reaction occurring within the last 10 years: no If all of the above answers are "NO", then may proceed with Cephalosporin use.   . Promethazine Hcl     REACTION: unspecified  . Septra [Sulfamethoxazole-Trimethoprim] Other (See Comments)    GI upset but not allergy    Family History: Family History  Problem Relation Age of Onset  . Cancer Brother        Pharyngeal   . Heart disease Brother        MI  . Diabetes Brother   . Diabetes Sister        amputation  . Hypertension Mother   . Stroke Father   . Kidney cancer Neg Hx   . Bladder Cancer Neg Hx     Social History:  reports that  has never smoked. she has never used smokeless tobacco. She reports that she does not drink alcohol or use  drugs.  ROS: UROLOGY Frequent Urination?: No Hard to postpone urination?: No Burning/pain with urination?: Yes Get up at night to urinate?: No Leakage of urine?: No Urine stream starts and stops?: No Trouble starting stream?: No Do you have to strain to urinate?: No Blood in urine?: No Urinary tract infection?: No Sexually transmitted disease?: No Injury to kidneys or bladder?: No Painful intercourse?: No Weak stream?: No Currently pregnant?: No Vaginal bleeding?: No Last menstrual period?: n  Gastrointestinal Nausea?: No Vomiting?: No Indigestion/heartburn?: No Diarrhea?: No Constipation?: No  Constitutional Fever: No Night sweats?: No Weight loss?: No Fatigue?: No  Skin Skin rash/lesions?: No Itching?: No  Eyes Blurred vision?: No Double vision?: No  Ears/Nose/Throat Sore throat?: No Sinus problems?: No  Hematologic/Lymphatic Swollen glands?: No Easy bruising?: No  Cardiovascular Leg swelling?: No Chest pain?: No  Respiratory Cough?: No Shortness of breath?: No  Endocrine Excessive thirst?: No  Musculoskeletal Back pain?: Yes Joint pain?: Yes  Neurological Headaches?: No Dizziness?: No     Physical Exam: BP 138/83   Pulse 98   Ht 5' (1.524 m)   Wt 107 lb (48.5 kg)   LMP 03/28/1989   BMI 20.90 kg/m   Constitutional: Well nourished. Alert and oriented, No acute distress. HEENT: Burden AT, moist mucus membranes. Trachea midline, no masses. Cardiovascular: No clubbing, cyanosis, or edema. Respiratory: Normal respiratory effort, no increased work of breathing. GI: Abdomen is soft, non tender, non distended, no abdominal masses. Liver and spleen not palpable.  No hernias appreciated.  Stool sample for occult testing is not indicated.   GU: No CVA tenderness.  No bladder fullness or masses.  Atrophic external genitalia, normal pubic hair distribution, no lesions.  Normal urethral meatus, no lesions, no prolapse, no discharge.   No urethral  masses, tenderness and/or tenderness. No bladder fullness, tenderness or masses. Pale vagina mucosa, poor estrogen effect, no discharge, no lesions, introitus is narrowed (1 cm aperture) good pelvic support, no cystocele or rectocele noted.  Cervix, uterus and adnexa are surgically absent.  Anus and perineum are without rashes or lesions.    Skin: No rashes, bruises or suspicious lesions. Lymph: No cervical or inguinal adenopathy. Neurologic: Grossly intact, no focal deficits, moving all 4 extremities. Psychiatric: Normal mood and affect.   Laboratory Data: Lab Results  Component Value Date   WBC 5.2 07/31/2017   HGB 11.9 (L) 07/31/2017   HCT 35.4 (L) 07/31/2017   MCV 92.0 07/31/2017   PLT 205.0 07/31/2017    Lab Results  Component Value Date   CREATININE 0.98 07/31/2017    Lab Results  Component Value Date   HGBA1C 5.7 07/31/2017    Lab Results  Component Value Date   TSH 0.72 07/31/2017       Component Value Date/Time   CHOL 192 09/20/2017 1348   HDL 58.40 09/20/2017 1348   CHOLHDL 3 09/20/2017 1348   VLDL 19.2 09/20/2017 1348   LDLCALC 114 (H) 09/20/2017 1348    Lab Results  Component Value Date   AST 21 07/31/2017   Lab Results  Component Value Date   ALT 9 07/31/2017   Urinalysis >30WBC's, 3-10 RBC's, many bacteria and nitrite positive.  See Epic.   I have reviewed the labs.   Pertinent Imaging: CLINICAL DATA:  Hx of appendix, GB, and partial hyst. NKI. Pt does have hx of thyroid CA with surgery. No current chest pain or SOB. Pt does c/o LLQ pain for several months. No N/V/D. Pt is non-smoker. Pt also c/o unintentional weight loss-approx. 20 lbs over past 6 months. Pt also c/o poor appetite.  EXAM: CT CHEST, ABDOMEN, AND PELVIS WITH CONTRAST  TECHNIQUE: Multidetector CT imaging of the chest, abdomen and pelvis was performed following the standard protocol during bolus administration of intravenous contrast.  CONTRAST:  94mL ISOVUE-300  IOPAMIDOL (ISOVUE-300) INJECTION 61%  COMPARISON:  Chest radiograph, 03/10/2017  FINDINGS: CT CHEST FINDINGS  Cardiovascular: Heart is normal in size and configuration. Mild left coronary artery calcifications. There mild aortic arch and descending thoracic aorta atherosclerosis. Branch vessels are widely patent.  Mediastinum/Nodes: No neck base or axillary masses or adenopathy. No thyroid tissue visualized. No mediastinal or hilar masses. No adenopathy. Trachea and esophagus are unremarkable.  Lungs/Pleura: No pneumonia or pulmonary edema. Small calcified granuloma in the right upper lobe. Mild peripheral interstitial thickening bilaterally. Mild apical pleuroparenchymal scarring. No pleural effusion or pneumothorax.  CT ABDOMEN PELVIS FINDINGS  Hepatobiliary: No liver mass or focal lesion. Status post cholecystectomy. There is mild intern extrahepatic bile duct dilation, common bile duct measuring 9 mm in greatest dimension with distal tapering. This is presumably chronic.  Pancreas: Atrophy, but no mass or inflammation.  Spleen: Normal in size without focal abnormality.  Adrenals/Urinary Tract: No adrenal masses. 5 mm low-density mass in the lower pole the right kidney consistent with cysts. No other renal masses, no stones and no hydronephrosis. Bladder is unremarkable.  Stomach/Bowel: There are colonic diverticula mostly along the sigmoid, but no evidence of diverticulitis. No colonic wall thickening or inflammation. No evidence of bowel obstruction. Stomach is moderately distended but otherwise unremarkable. Small bowel is within normal limits.  Vascular/Lymphatic: Mild aortic atherosclerotic disease. No adenopathy.  Reproductive: Status post hysterectomy. No adnexal masses.  Other: No abdominal wall hernia or abnormality. No abdominopelvic ascites.  MUSCULOSKELETAL FINDINGS  Status post ORIF of a right proximal femur fracture with  screws. Fracture appears well healed. No acute fracture. No osteolytic lesions. Focal bone island in the T11 vertebra. Bones are demineralized.  There are degenerative changes throughout the spine.  IMPRESSION: CHEST CT  1. No acute findings within the chest. No evidence of pneumonia or pulmonary edema. 2. Mild left coronary artery calcifications. Mild aortic atherosclerosis. ABDOMEN AND PELVIS CT  1. No acute findings within the abdomen or pelvis. No findings to account for left lower quadrant abdominal pain. 2. There are colonic diverticula particularly along the sigmoid, but no evidence of diverticulitis. 3. Status post cholecystectomy and hysterectomy. Mild chronic intra and extrahepatic bile duct dilation. 4. Mild aortic atherosclerosis.   Electronically Signed   By: Lajean Manes M.D.   On: 09/07/2017 16:29   Assessment & Plan:    1. Recurrent UTI's  - Reviewed UTI prevention strategies with the patient  -Patient is symptomatic at this time - UA is suspicious for infection  -start Macrobid empirically - will adjust if necessary once culture is final  2. Vaginal atrophy  - continue the vaginal estrogen cream  - She will follow up in 12 months for an exam.                                            Return for pending urine culture results.  These notes generated with voice recognition software. I apologize for typographical errors.  Zara Council, [facility] [address] [place], [place] [address] [phone]

## 2018-01-08 ENCOUNTER — Encounter: Payer: Self-pay | Admitting: Urology

## 2018-01-08 ENCOUNTER — Ambulatory Visit (INDEPENDENT_AMBULATORY_CARE_PROVIDER_SITE_OTHER): Payer: Medicare Other | Admitting: Urology

## 2018-01-08 VITALS — BP 138/83 | HR 98 | Ht 60.0 in | Wt 107.0 lb

## 2018-01-08 DIAGNOSIS — N952 Postmenopausal atrophic vaginitis: Secondary | ICD-10-CM | POA: Diagnosis not present

## 2018-01-08 DIAGNOSIS — Z87440 Personal history of urinary (tract) infections: Secondary | ICD-10-CM

## 2018-01-08 LAB — URINALYSIS, COMPLETE
Bilirubin, UA: NEGATIVE
Glucose, UA: NEGATIVE
Ketones, UA: NEGATIVE
Nitrite, UA: POSITIVE — AB
Protein, UA: NEGATIVE
Specific Gravity, UA: 1.01 (ref 1.005–1.030)
Urobilinogen, Ur: 0.2 mg/dL (ref 0.2–1.0)
pH, UA: 5.5 (ref 5.0–7.5)

## 2018-01-08 LAB — MICROSCOPIC EXAMINATION
Epithelial Cells (non renal): >10 /hpf — ABNORMAL HIGH (ref 0–10)
WBC, UA: >30 /hpf — ABNORMAL HIGH (ref 0–?)

## 2018-01-08 MED ORDER — NITROFURANTOIN MONOHYD MACRO 100 MG PO CAPS: 100.0000 mg | ORAL_CAPSULE | Freq: Two times a day (BID) | ORAL | 0 refills | Status: DC

## 2018-01-21 DIAGNOSIS — D0439 Carcinoma in situ of skin of other parts of face: Secondary | ICD-10-CM | POA: Diagnosis not present

## 2018-01-21 DIAGNOSIS — D225 Melanocytic nevi of trunk: Secondary | ICD-10-CM | POA: Diagnosis not present

## 2018-01-21 DIAGNOSIS — L905 Scar conditions and fibrosis of skin: Secondary | ICD-10-CM | POA: Diagnosis not present

## 2018-01-21 DIAGNOSIS — Z85828 Personal history of other malignant neoplasm of skin: Secondary | ICD-10-CM | POA: Diagnosis not present

## 2018-01-21 DIAGNOSIS — D2261 Melanocytic nevi of right upper limb, including shoulder: Secondary | ICD-10-CM | POA: Diagnosis not present

## 2018-01-21 DIAGNOSIS — D2272 Melanocytic nevi of left lower limb, including hip: Secondary | ICD-10-CM | POA: Diagnosis not present

## 2018-01-22 ENCOUNTER — Telehealth: Payer: Self-pay | Admitting: Urology

## 2018-01-22 NOTE — Telephone Encounter (Signed)
She will need to come in an see me.  Can see come tomorrow?

## 2018-01-22 NOTE — Telephone Encounter (Signed)
Pt LMOM stating that she has finished her "meds" and cream and is still experiencing burning outside and achy legs. Pt stated she would feel some relief every other day so she decided to use the cream everyday.  Pt complains of vaginal burning so bad she is having to keep a cold wash cloth between her legs. Denies UTI symptoms. Please advise. [phone] Thanks.

## 2018-01-23 ENCOUNTER — Ambulatory Visit: Payer: Medicare Other | Admitting: Urology

## 2018-01-23 ENCOUNTER — Encounter: Payer: Self-pay | Admitting: Urology

## 2018-01-23 VITALS — BP 143/82 | HR 100 | Resp 16 | Ht 62.0 in | Wt 106.3 lb

## 2018-01-23 DIAGNOSIS — Z87440 Personal history of urinary (tract) infections: Secondary | ICD-10-CM | POA: Diagnosis not present

## 2018-01-23 DIAGNOSIS — N952 Postmenopausal atrophic vaginitis: Secondary | ICD-10-CM | POA: Diagnosis not present

## 2018-01-23 MED ORDER — NITROFURANTOIN MONOHYD MACRO 100 MG PO CAPS: 100.0000 mg | ORAL_CAPSULE | Freq: Two times a day (BID) | ORAL | 0 refills | Status: DC

## 2018-01-23 NOTE — Telephone Encounter (Signed)
appt scheduled

## 2018-01-24 LAB — URINALYSIS, COMPLETE
Bilirubin, UA: NEGATIVE
Glucose, UA: NEGATIVE
Ketones, UA: NEGATIVE
Nitrite, UA: POSITIVE — AB
Protein, UA: NEGATIVE
Specific Gravity, UA: 1.01 (ref 1.005–1.030)
Urobilinogen, Ur: 0.2 mg/dL (ref 0.2–1.0)
pH, UA: 6 (ref 5.0–7.5)

## 2018-01-24 LAB — MICROSCOPIC EXAMINATION
Epithelial Cells (non renal): >10 /hpf — ABNORMAL HIGH (ref 0–10)
WBC, UA: >30 /hpf — ABNORMAL HIGH (ref 0–?)

## 2018-01-24 NOTE — Progress Notes (Signed)
01/23/2018 2:33 PM   [name] [facility] 1930/10/01 [id number]  Referring provider: [name], MD [address] [place], [place] [address]  Chief Complaint  Patient presents with  . Follow-up    UTI    HPI: 5 WF with recurrent urinary tract infections and vaginal atrophy who presents today for the complaint of burning.  Background history Patient is a 84 -year-old Caucasian female who is referred to [name] by, Dr. [name], for recurrent urinary tract infections.  Patient states that she has not had very many urinary tract infections over the last year.  Reviewing her records,  she has had two documented positive cultures in the last 6 months.  One for Klebsiella and 1 for Pseudomonas. She is recently completed her antibiotic for the Pseudomonas infection.  She has been on three antibiotics over the last few weeks.  Her symptoms with a urinary tract infection consist of suprapubic pressure and pain.  She denies dysuria, gross hematuria, suprapubic pain, back pain, abdominal pain or flank pain at this time.  She has not had any recent fevers, chills, nausea or vomiting.  Today, she is experiencing frequency, urgency, burning, nocturia, intermittency, straining to urinate and a weak urinary stream.  Her CATH UA is negative.  PVR is 60 mL.   She does not have a history of nephrolithiasis, GU surgery or GU trauma.  She is not sexually active.  She is postmenopausal.  She admits to constipation.   She does engage in good perineal hygiene. She does not take tub baths.  She does not have incontinence.   Contrast CT performed on 09/07/2017 no adrenal masses. 5 mm low-density mass in the lower pole the right kidney consistent with cysts. No other renal masses, no stones and no hydronephrosis. Bladder is unremarkable.  She is not drinking a lot water daily.   Recently cut back on sodas.  Reviewed referral notes - see above  At her visit on 09/11/2017, she was initiated on vaginal estrogen  cream, encouraged to increase her water intake to 1.5 L daily, encouraged to take a daily probiotic, encouraged to drink cranberry juice and/or take cranberry tablets daily and take vitamin C 1000 mg daily.  At her visit on 09/26/2017, she was experiencing urgency x 0-3, frequency x 4-7, not restricting fluids to avoid visits to the restroom, is engaging in toilet mapping, incontinence x 0-3 and nocturia x 0-3.    She states she has noticed a decrease in the vaginal irritation since starting the Premarin cream. She's not had a UTI since her visit with [name] 2 weeks ago. She denies suprapubic pain and gross hematuria. She has not had fevers, chills, nausea or vomiting.  Today, she is complaining of dysuria, suprapubic pain and legs aching.  She has had the symptoms for almost 2 weeks.  She is applying the vaginal estrogen nightly without relief.   Nothing seems to make it worse.  She denies fevers, chills, nausea and vomiting.  She denies any hematuria at this time.  Her cath UA is positive for greater than 30 WBCs, 10 RBCs, many bacteria and nitrate positive.    PMH: Past Medical History:  Diagnosis Date  . Anxiety   . Barrett esophagus   . Diplopia    history of,hospital 2/26-2/27/10  . Diverticulitis    colonoscopy 1995.  CT 01/31/09, 02/02/09  . Hiatal hernia   . Hiatal hernia    EGD 1998, 2007 ( Dr. Sharlett Iles)  . Hyperlipemia   .  Hypertension   . Hypothyroidism   . Osteoporosis DXA 2015  . Personal history of colonic polyps    colonoscpy, removed 1995  . Thyroid carcinoma ([place]) 01/1994  . UTI (urinary tract infection)    Chronic    Surgical History: Past Surgical History:  Procedure Laterality Date  . ABDOMINAL HYSTERECTOMY    . CHOLECYSTECTOMY    . gyn surgery     hysterectomy  . HIP SURGERY  08/1993   due to fracture of right hip  . THYROIDECTOMY  04/1994   thyroid cancer    Home Medications:  Allergies as of 01/23/2018      Reactions   Bentyl [dicyclomine Hcl] Other (See  Comments)   abd pain   Ciprofloxacin    Can cause GI upset, esp if taken with metamucil, o/w it is tolerated   Cortisone Acetate    REACTION: nausea   Doxycycline Other (See Comments)   GI upset   Gabapentin    REACTION: severe dizziness   Metoclopramide Hcl    Penicillins    Has patient had a PCN reaction causing immediate rash, facial/tongue/throat swelling, SOB or lightheadedness with hypotension: yes Has patient had a PCN reaction causing severe rash involving mucus membranes or skin necrosis: yes Has patient had a PCN reaction that required hospitalization no Has patient had a PCN reaction occurring within the last 10 years: no If all of the above answers are "NO", then may proceed with Cephalosporin use.   Promethazine Hcl    REACTION: unspecified   Septra [sulfamethoxazole-trimethoprim] Other (See Comments)   GI upset but not allergy      Medication List        Accurate as of 01/23/18 11:59 PM. Always use your most recent med list.          B COMPLEX-B12 PO Take by mouth. Taken daily   BIOTENE DRY MOUTH MOISTURIZING Soln Use as directed in the mouth or throat.   Cholecalciferol 1000 units Chew Commonly known as:  [employer] VITAMIN D3 Chew 1 tablet (1,000 Units total) by mouth daily.   clidinium-chlordiazePOXIDE 5-2.5 MG capsule Commonly known as:  [name] TAKE 1 CAPSULE BY MOUTH EVERY DAY   conjugated estrogens vaginal cream Commonly known as:  PREMARIN Place 1 Applicatorful vaginally daily. Apply 0.5mg  (pea-sized amount)  just inside the vaginal introitus with a finger-tip every night for two weeks and then Monday, Wednesday and Friday nights.   [employer]S ALLERGY RELIEF 10 MG dissolvable tablet Generic drug:  loratadine Take 10 mg by mouth daily as needed for allergies.   HYDROcodone-acetaminophen 5-325 MG tablet Commonly known as:  NORCO/VICODIN TAKE ONE-HALF TO ONE(1) TABLET BY MOUTH EVERY 6 HOURS AS NEEDED FOR PAIN   lisinopril 10 MG tablet Commonly known as:   PRINIVIL,ZESTRIL Take 1 tablet (10 mg total) by mouth at bedtime.   LORazepam 0.5 MG tablet Commonly known as:  ATIVAN TAKE ONE-HALF TO ONE TABLET BY MOUTH TWOTIMES DAILY AS NEEDED FOR ANXIETY   MIRALAX powder Generic drug:  polyethylene glycol powder Take 17 g by mouth every evening.   mirtazapine 15 MG tablet Commonly known as:  REMERON TAKE ONE TABLET BY MOUTH EVERY NIGHT AT BEDTIME   nitrofurantoin (macrocrystal-monohydrate) 100 MG capsule Commonly known as:  MACROBID Take 1 capsule (100 mg total) by mouth every 12 (twelve) hours.   omeprazole 40 MG capsule Commonly known as:  PRILOSEC TAKE 1 CAPSULE BY MOUTH 45 MINUTES BEFORE BREAKFAST   SYNTHROID 88 MCG tablet Generic drug:  levothyroxine  Take 1 tablet (88 mcg total) by mouth daily.   VITAMIN B-12 PO Take by mouth.       Allergies:  Allergies  Allergen Reactions  . Bentyl [Dicyclomine Hcl] Other (See Comments)    abd pain  . Ciprofloxacin     Can cause GI upset, esp if taken with metamucil, o/w it is tolerated  . Cortisone Acetate     REACTION: nausea  . Doxycycline Other (See Comments)    GI upset  . Gabapentin     REACTION: severe dizziness  . Metoclopramide Hcl   . Penicillins     Has patient had a PCN reaction causing immediate rash, facial/tongue/throat swelling, SOB or lightheadedness with hypotension: yes Has patient had a PCN reaction causing severe rash involving mucus membranes or skin necrosis: yes Has patient had a PCN reaction that required hospitalization no Has patient had a PCN reaction occurring within the last 10 years: no If all of the above answers are "NO", then may proceed with Cephalosporin use.   . Promethazine Hcl     REACTION: unspecified  . Septra [Sulfamethoxazole-Trimethoprim] Other (See Comments)    GI upset but not allergy    Family History: Family History  Problem Relation Age of Onset  . Cancer Brother        Pharyngeal   . Heart disease Brother        MI  .  Diabetes Brother   . Diabetes Sister        amputation  . Hypertension Mother   . Stroke Father   . Kidney cancer Neg Hx   . Bladder Cancer Neg Hx     Social History:  reports that  has never smoked. she has never used smokeless tobacco. She reports that she does not drink alcohol or use drugs.  ROS: UROLOGY Frequent Urination?: No Hard to postpone urination?: No Burning/pain with urination?: Yes Get up at night to urinate?: No Leakage of urine?: No Urine stream starts and stops?: No Trouble starting stream?: No Do you have to strain to urinate?: No Blood in urine?: No Urinary tract infection?: Yes Sexually transmitted disease?: No Injury to kidneys or bladder?: No Painful intercourse?: No Weak stream?: No Currently pregnant?: No Vaginal bleeding?: No Last menstrual period?: n  Gastrointestinal Nausea?: No Vomiting?: No Indigestion/heartburn?: No Diarrhea?: No Constipation?: No  Constitutional Fever: No Night sweats?: No Weight loss?: No Fatigue?: No  Skin Skin rash/lesions?: No Itching?: No  Eyes Blurred vision?: No Double vision?: No  Ears/Nose/Throat Sore throat?: No Sinus problems?: No  Hematologic/Lymphatic Swollen glands?: No Easy bruising?: No  Cardiovascular Leg swelling?: No Chest pain?: No  Respiratory Cough?: No Shortness of breath?: No  Endocrine Excessive thirst?: No  Musculoskeletal Back pain?: Yes Joint pain?: Yes  Neurological Headaches?: No Dizziness?: No  Psychologic Depression?: No Anxiety?: No  Physical Exam: BP (!) 143/82   Pulse 100   Resp 16   Ht 5\' 2"  (1.575 m)   Wt 106 lb 4.8 oz (48.2 kg)   LMP 03/28/1989   SpO2 97%   BMI 19.44 kg/m   Constitutional: Well nourished. Alert and oriented, No acute distress. HEENT: Jet AT, moist mucus membranes. Trachea midline, no masses. Cardiovascular: No clubbing, cyanosis, or edema. Respiratory: Normal respiratory effort, no increased work of breathing. GI:  Abdomen is soft, non tender, non distended, no abdominal masses. Liver and spleen not palpable.  No hernias appreciated.  Stool sample for occult testing is not indicated.   GU: No CVA tenderness.  No bladder fullness or masses.  Atrophic external genitalia, normal pubic hair distribution, no lesions.  Normal urethral meatus, no lesions, no prolapse, no discharge.   No urethral masses, tenderness and/or tenderness. Bladder is tender.  No bladder fullness or masses. Pale vagina mucosa, poor estrogen effect, no discharge, no lesions, good pelvic support, no cystocele or rectocele noted.  Cervix, adnexa and uterus are surgically absent.  Anus and perineum are without rashes or lesions.    Skin: No rashes, bruises or suspicious lesions. Lymph: No cervical or inguinal adenopathy. Neurologic: Grossly intact, no focal deficits, moving all 4 extremities. Psychiatric: Normal mood and affect.    Laboratory Data: Lab Results  Component Value Date   WBC 5.2 07/31/2017   HGB 11.9 (L) 07/31/2017   HCT 35.4 (L) 07/31/2017   MCV 92.0 07/31/2017   PLT 205.0 07/31/2017    Lab Results  Component Value Date   CREATININE 0.98 07/31/2017    Lab Results  Component Value Date   HGBA1C 5.7 07/31/2017    Lab Results  Component Value Date   TSH 0.72 07/31/2017       Component Value Date/Time   CHOL 192 09/20/2017 1348   HDL 58.40 09/20/2017 1348   CHOLHDL 3 09/20/2017 1348   VLDL 19.2 09/20/2017 1348   LDLCALC 114 (H) 09/20/2017 1348    Lab Results  Component Value Date   AST 21 07/31/2017   Lab Results  Component Value Date   ALT 9 07/31/2017   Urinalysis >30WBC's, 3-10 RBC's, many bacteria and nitrite positive.  See Epic.   I have reviewed the labs.   Pertinent Imaging: CLINICAL DATA:  Hx of appendix, GB, and partial hyst. NKI. Pt does have hx of thyroid CA with surgery. No current chest pain or SOB. Pt does c/o LLQ pain for several months. No N/V/D. Pt is non-smoker. Pt also c/o  unintentional weight loss-approx. 20 lbs over past 6 months. Pt also c/o poor appetite.  EXAM: CT CHEST, ABDOMEN, AND PELVIS WITH CONTRAST  TECHNIQUE: Multidetector CT imaging of the chest, abdomen and pelvis was performed following the standard protocol during bolus administration of intravenous contrast.  CONTRAST:  66mL ISOVUE-300 IOPAMIDOL (ISOVUE-300) INJECTION 61%  COMPARISON:  Chest radiograph, 03/10/2017  FINDINGS: CT CHEST FINDINGS  Cardiovascular: Heart is normal in size and configuration. Mild left coronary artery calcifications. There mild aortic arch and descending thoracic aorta atherosclerosis. Branch vessels are widely patent.  Mediastinum/Nodes: No neck base or axillary masses or adenopathy. No thyroid tissue visualized. No mediastinal or hilar masses. No adenopathy. Trachea and esophagus are unremarkable.  Lungs/Pleura: No pneumonia or pulmonary edema. Small calcified granuloma in the right upper lobe. Mild peripheral interstitial thickening bilaterally. Mild apical pleuroparenchymal scarring. No pleural effusion or pneumothorax.  CT ABDOMEN PELVIS FINDINGS  Hepatobiliary: No liver mass or focal lesion. Status post cholecystectomy. There is mild intern extrahepatic bile duct dilation, common bile duct measuring 9 mm in greatest dimension with distal tapering. This is presumably chronic.  Pancreas: Atrophy, but no mass or inflammation.  Spleen: Normal in size without focal abnormality.  Adrenals/Urinary Tract: No adrenal masses. 5 mm low-density mass in the lower pole the right kidney consistent with cysts. No other renal masses, no stones and no hydronephrosis. Bladder is unremarkable.  Stomach/Bowel: There are colonic diverticula mostly along the sigmoid, but no evidence of diverticulitis. No colonic wall thickening or inflammation. No evidence of bowel obstruction. Stomach is moderately distended but otherwise unremarkable.  Small bowel is within normal limits.  Vascular/Lymphatic: Mild aortic atherosclerotic disease. No adenopathy.  Reproductive: Status post hysterectomy. No adnexal masses.  Other: No abdominal wall hernia or abnormality. No abdominopelvic ascites.  MUSCULOSKELETAL FINDINGS  Status post ORIF of a right proximal femur fracture with screws. Fracture appears well healed. No acute fracture. No osteolytic lesions. Focal bone island in the T11 vertebra. Bones are demineralized. There are degenerative changes throughout the spine.  IMPRESSION: CHEST CT  1. No acute findings within the chest. No evidence of pneumonia or pulmonary edema. 2. Mild left coronary artery calcifications. Mild aortic atherosclerosis. ABDOMEN AND PELVIS CT  1. No acute findings within the abdomen or pelvis. No findings to account for left lower quadrant abdominal pain. 2. There are colonic diverticula particularly along the sigmoid, but no evidence of diverticulitis. 3. Status post cholecystectomy and hysterectomy. Mild chronic intra and extrahepatic bile duct dilation. 4. Mild aortic atherosclerosis.   Electronically Signed   By: Lajean Manes M.D.   On: 09/07/2017 16:29   Assessment & Plan:    1. History of recurrent UTI's  -Reviewed UTI prevention strategies with the patient  -Patient is symptomatic at this time - UA is suspicious for infection  -start Macrobid empirically - will adjust if necessary once culture is final  -RTC in 2 weeks for recheck on UA and symptoms  2. Vaginal atrophy  - continue the vaginal estrogen cream - advised the patient that it is a maintenance cream and not to be used as needed for burning - She will follow up in 12 months for an exam.                                            Return in about 2 weeks (around 02/06/2018).  These notes generated with voice recognition software. I apologize for typographical errors.  Zara Council, [facility] [address] [place], [place] [address] [phone]

## 2018-01-25 ENCOUNTER — Other Ambulatory Visit: Payer: Self-pay | Admitting: Urology

## 2018-01-25 LAB — CULTURE, URINE COMPREHENSIVE

## 2018-01-25 MED ORDER — SULFAMETHOXAZOLE-TRIMETHOPRIM 800-160 MG PO TABS: 1.0000 | ORAL_TABLET | Freq: Two times a day (BID) | ORAL | 0 refills | Status: DC

## 2018-01-25 NOTE — Progress Notes (Signed)
See note under urine culture from 01/23/2018.

## 2018-01-28 ENCOUNTER — Telehealth: Payer: Self-pay

## 2018-01-28 NOTE — Telephone Encounter (Signed)
Spoke with pt in reference to +ucx and bactrim. Pt stated that she picked up medication on Friday and has started.

## 2018-01-28 NOTE — Telephone Encounter (Signed)
-----   Message from Nori Riis, PA-C sent at 01/25/2018  4:46 PM EST ----- Please let Mrs. Bailiff now that her urine culture was positive for an infection.  We need to change her antibiotic.  She needs to stop the nitrofurantoin and start Septra DS twice daily for 7 days.  I have sent the script to [place].

## 2018-02-06 NOTE — Progress Notes (Signed)
02/07/2018 2:34 PM   [name] [name] 12-Dec-1929 [id number]  Referring provider: [name], MD [name], [name] [id number]  No chief complaint on file.   HPI: 67 WF with recurrent urinary tract infections and vaginal atrophy who presents today for the complaint of burning.  Background history Patient is a 84 -year-old Caucasian female who is referred to [name] by, Dr. Damita Burke, for recurrent urinary tract infections.  Patient states that she has not had very many urinary tract infections over the last year.  Reviewing her records,  she has had two documented positive cultures in the last 6 months.  One for Klebsiella and 1 for Pseudomonas. She is recently completed her antibiotic for the Pseudomonas infection.  She has been on three antibiotics over the last few weeks.  Her symptoms with a urinary tract infection consist of suprapubic pressure and pain.  She denies dysuria, gross hematuria, suprapubic pain, back pain, abdominal pain or flank pain at this time.  She has not had any recent fevers, chills, nausea or vomiting.  Today, she is experiencing frequency, urgency, burning, nocturia, intermittency, straining to urinate and a weak urinary stream.  Her CATH UA is negative.  PVR is 60 mL.   She does not have a history of nephrolithiasis, GU surgery or GU trauma.  She is not sexually active.  She is postmenopausal.  She admits to constipation.   She does engage in good perineal hygiene. She does not take tub baths.  She does not have incontinence.   Contrast CT performed on 09/07/2017 no adrenal masses. 5 mm low-density mass in the lower pole the right kidney consistent with cysts. No other renal masses, no stones and no hydronephrosis. Bladder is unremarkable.  She is not drinking a lot water daily.   Recently cut back on sodas.  Reviewed referral notes - see above  At her visit on 09/11/2017, she was initiated on vaginal estrogen cream, encouraged to increase her water  intake to 1.5 L daily, encouraged to take a daily probiotic, encouraged to drink cranberry juice and/or take cranberry tablets daily and take vitamin C 1000 mg daily.  At her visit on 09/26/2017, she was experiencing urgency x 0-3, frequency x 4-7, not restricting fluids to avoid visits to the restroom, is engaging in toilet mapping, incontinence x 0-3 and nocturia x 0-3.    She states she has noticed a decrease in the vaginal irritation since starting the Premarin cream. She's not had a UTI since her visit with [name] 2 weeks ago. She denies suprapubic pain and gross hematuria. She has not had fevers, chills, nausea or vomiting.  At her visit on 01/23/2018, she was complaining of dysuria, suprapubic pain and legs aching.  She had the symptoms for almost 2 weeks.  She was applying the vaginal estrogen nightly without relief.   Nothing seemed to make it worse.  She denied fevers, chills, nausea and vomiting.  She denied any hematuria at this time.  Her cath UA was positive for greater than 30 WBCs, 10 RBCs, many bacteria and nitrate positive.  Her urine culture was positive for Klebsiella pneumoniae with a variable resistance pattern.  She was treated with Septa DS.    Today, she states her symptoms have lessened, but she is still having the suprapubic pain, dysuria and leg aches.  Patient denies any gross hematuria or flank pain.  Patient denies any fevers, chills, nausea or vomiting.   She states the suprapubic pain  is manly on the left and radiating to her hip.  She could not be more specific concerning the dysuria.  She could not pinpoint where the dysuria was eminating or at what point of the urinary stream the dysuria was occurring.  Her CATH UA was negative.    PMH: Past Medical History:  Diagnosis Date  . Anxiety   . Barrett esophagus   . Diplopia    history of,Burke 2/26-2/27/10  . Diverticulitis    colonoscopy 1995.  CT 01/31/09, 02/02/09  . Hiatal hernia   . Hiatal hernia    EGD 1998, 2007 (  Dr. Sharlett Iles)  . Hyperlipemia   . Hypertension   . Hypothyroidism   . Osteoporosis DXA 2015  . Personal history of colonic polyps    colonoscpy, removed 1995  . Thyroid carcinoma ([place]) 01/1994  . UTI (urinary tract infection)    Chronic    Surgical History: Past Surgical History:  Procedure Laterality Date  . ABDOMINAL HYSTERECTOMY    . CHOLECYSTECTOMY    . gyn surgery     hysterectomy  . HIP SURGERY  08/1993   due to fracture of right hip  . THYROIDECTOMY  04/1994   thyroid cancer    Home Medications:  Allergies as of 02/07/2018      Reactions   Bentyl [dicyclomine Hcl] Other (See Comments)   abd pain   Ciprofloxacin    Can cause GI upset, esp if taken with metamucil, o/w it is tolerated   Cortisone Acetate    REACTION: nausea   Doxycycline Other (See Comments)   GI upset   Gabapentin    REACTION: severe dizziness   Metoclopramide Hcl    Penicillins    Has patient had a PCN reaction causing immediate rash, facial/tongue/throat swelling, SOB or lightheadedness with hypotension: yes Has patient had a PCN reaction causing severe rash involving mucus membranes or skin necrosis: yes Has patient had a PCN reaction that required hospitalization no Has patient had a PCN reaction occurring within the last 10 years: no If all of the above answers are "NO", then may proceed with Cephalosporin use.   Promethazine Hcl    REACTION: unspecified   Septra [sulfamethoxazole-trimethoprim] Other (See Comments)   GI upset but not allergy      Medication List        Accurate as of 02/07/18  2:34 PM. Always use your most recent med list.          B COMPLEX-B12 PO Take by mouth. Taken daily   BIOTENE DRY MOUTH MOISTURIZING Soln Use as directed in the mouth or throat.   Cholecalciferol 1000 units Chew Commonly known as:  [facility] VITAMIN D3 Chew 1 tablet (1,000 Units total) by mouth daily.   clidinium-chlordiazePOXIDE 5-2.5 MG capsule Commonly known as:  [name] TAKE 1 CAPSULE  BY MOUTH EVERY DAY   conjugated estrogens vaginal cream Commonly known as:  PREMARIN Place 1 Applicatorful vaginally daily. Apply 0.5mg  (pea-sized amount)  just inside the vaginal introitus with a finger-tip every night for two weeks and then Monday, Wednesday and Friday nights.   [facility] ALLERGY RELIEF 10 MG dissolvable tablet Generic drug:  loratadine Take 10 mg by mouth daily as needed for allergies.   HYDROcodone-acetaminophen 5-325 MG tablet Commonly known as:  NORCO/VICODIN TAKE ONE-HALF TO ONE(1) TABLET BY MOUTH EVERY 6 HOURS AS NEEDED FOR PAIN   lisinopril 10 MG tablet Commonly known as:  PRINIVIL,ZESTRIL Take 1 tablet (10 mg total) by mouth at bedtime.  LORazepam 0.5 MG tablet Commonly known as:  ATIVAN TAKE ONE-HALF TO ONE TABLET BY MOUTH TWOTIMES DAILY AS NEEDED FOR ANXIETY   MIRALAX powder Generic drug:  polyethylene glycol powder Take 17 g by mouth every evening.   mirtazapine 15 MG tablet Commonly known as:  REMERON TAKE ONE TABLET BY MOUTH EVERY NIGHT AT BEDTIME   nitrofurantoin (macrocrystal-monohydrate) 100 MG capsule Commonly known as:  MACROBID Take 1 capsule (100 mg total) by mouth every 12 (twelve) hours.   omeprazole 40 MG capsule Commonly known as:  PRILOSEC TAKE 1 CAPSULE BY MOUTH 45 MINUTES BEFORE BREAKFAST   sulfamethoxazole-trimethoprim 800-160 MG tablet Commonly known as:  BACTRIM DS,SEPTRA DS Take 1 tablet by mouth every 12 (twelve) hours.   SYNTHROID 88 MCG tablet Generic drug:  levothyroxine Take 1 tablet (88 mcg total) by mouth daily.   VITAMIN B-12 PO Take by mouth.       Allergies:  Allergies  Allergen Reactions  . Bentyl [Dicyclomine Hcl] Other (See Comments)    abd pain  . Ciprofloxacin     Can cause GI upset, esp if taken with metamucil, o/w it is tolerated  . Cortisone Acetate     REACTION: nausea  . Doxycycline Other (See Comments)    GI upset  . Gabapentin     REACTION: severe dizziness  . Metoclopramide Hcl   .  Penicillins     Has patient had a PCN reaction causing immediate rash, facial/tongue/throat swelling, SOB or lightheadedness with hypotension: yes Has patient had a PCN reaction causing severe rash involving mucus membranes or skin necrosis: yes Has patient had a PCN reaction that required hospitalization no Has patient had a PCN reaction occurring within the last 10 years: no If all of the above answers are "NO", then may proceed with Cephalosporin use.   . Promethazine Hcl     REACTION: unspecified  . Septra [Sulfamethoxazole-Trimethoprim] Other (See Comments)    GI upset but not allergy    Family History: Family History  Problem Relation Age of Onset  . Cancer Brother        Pharyngeal   . Heart disease Brother        MI  . Diabetes Brother   . Diabetes Sister        amputation  . Hypertension Mother   . Stroke Father   . Kidney cancer Neg Hx   . Bladder Cancer Neg Hx     Social History:  reports that  has never smoked. she has never used smokeless tobacco. She reports that she does not drink alcohol or use drugs.  ROS: UROLOGY Frequent Urination?: No Hard to postpone urination?: No Burning/pain with urination?: No Get up at night to urinate?: No Leakage of urine?: No Urine stream starts and stops?: No Trouble starting stream?: No Do you have to strain to urinate?: No Blood in urine?: No Urinary tract infection?: Yes Sexually transmitted disease?: No Injury to kidneys or bladder?: No Painful intercourse?: No Weak stream?: No Currently pregnant?: No Vaginal bleeding?: No Last menstrual period?: n  Gastrointestinal Nausea?: No Vomiting?: No Indigestion/heartburn?: No Diarrhea?: No Constipation?: No  Constitutional Fever: No Night sweats?: No Weight loss?: No Fatigue?: No  Skin Skin rash/lesions?: No Itching?: No  Eyes Blurred vision?: No Double vision?: No  Ears/Nose/Throat Sore throat?: No Sinus problems?:  No  Hematologic/Lymphatic Swollen glands?: No Easy bruising?: No  Cardiovascular Leg swelling?: No Chest pain?: No  Respiratory Cough?: No Shortness of breath?: No  Endocrine Excessive thirst?:  No  Musculoskeletal Back pain?: Yes Joint pain?: Yes  Neurological Headaches?: No Dizziness?: No  Psychologic Depression?: No Anxiety?: No  Physical Exam: BP 97/64   Pulse (!) 101   Resp 16   Ht 5\' 5"  (1.651 m)   Wt 106 lb (48.1 kg)   LMP 03/28/1989   SpO2 98%   BMI 17.64 kg/m   Constitutional: Well nourished. Alert and oriented, No acute distress. HEENT: [place] AT, moist mucus membranes. Trachea midline, no masses. Cardiovascular: No clubbing, cyanosis, or edema. Respiratory: Normal respiratory effort, no increased work of breathing. GI: Abdomen is soft, non tender, non distended, no abdominal masses. Liver and spleen not palpable.  No hernias appreciated.  Stool sample for occult testing is not indicated.   GU: No CVA tenderness.  No bladder fullness or masses.  Atrophic external genitalia, normal pubic hair distribution, no lesions.  Normal urethral meatus, no lesions, no prolapse, no discharge.   No urethral masses, tenderness and/or tenderness. No bladder fullness, tenderness or masses. Pale vagina mucosa, poor estrogen effect, no discharge, no lesions, good pelvic support, no cystocele or rectocele noted.  Cervix, uterus and adnexa are surgically absent.  Anus and perineum are without rashes or lesions.    Skin: No rashes, bruises or suspicious lesions. Lymph: No cervical or inguinal adenopathy. Neurologic: Grossly intact, no focal deficits, moving all 4 extremities. Psychiatric: Normal mood and affect.  Laboratory Data: Lab Results  Component Value Date   WBC 5.2 07/31/2017   HGB 11.9 (L) 07/31/2017   HCT 35.4 (L) 07/31/2017   MCV 92.0 07/31/2017   PLT 205.0 07/31/2017    Lab Results  Component Value Date   CREATININE 0.98 07/31/2017    Lab Results   Component Value Date   HGBA1C 5.7 07/31/2017    Lab Results  Component Value Date   TSH 0.72 07/31/2017       Component Value Date/Time   CHOL 192 09/20/2017 1348   HDL 58.40 09/20/2017 1348   CHOLHDL 3 09/20/2017 1348   VLDL 19.2 09/20/2017 1348   LDLCALC 114 (H) 09/20/2017 1348    Lab Results  Component Value Date   AST 21 07/31/2017   Lab Results  Component Value Date   ALT 9 07/31/2017   Urinalysis CATH UA is negative.  See Epic.   I have reviewed the labs.   Pertinent Imaging: CLINICAL DATA:  Hx of appendix, GB, and partial hyst. NKI. Pt does have hx of thyroid CA with surgery. No current chest pain or SOB. Pt does c/o LLQ pain for several months. No N/V/D. Pt is non-smoker. Pt also c/o unintentional weight loss-approx. 20 lbs over past 6 months. Pt also c/o poor appetite.  EXAM: CT CHEST, ABDOMEN, AND PELVIS WITH CONTRAST  TECHNIQUE: Multidetector CT imaging of the chest, abdomen and pelvis was performed following the standard protocol during bolus administration of intravenous contrast.  CONTRAST:  36mL ISOVUE-300 IOPAMIDOL (ISOVUE-300) INJECTION 61%  COMPARISON:  Chest radiograph, 03/10/2017  FINDINGS: CT CHEST FINDINGS  Cardiovascular: Heart is normal in size and configuration. Mild left coronary artery calcifications. There mild aortic arch and descending thoracic aorta atherosclerosis. Branch vessels are widely patent.  Mediastinum/Nodes: No neck base or axillary masses or adenopathy. No thyroid tissue visualized. No mediastinal or hilar masses. No adenopathy. Trachea and esophagus are unremarkable.  Lungs/Pleura: No pneumonia or pulmonary edema. Small calcified granuloma in the right upper lobe. Mild peripheral interstitial thickening bilaterally. Mild apical pleuroparenchymal scarring. No pleural effusion or pneumothorax.  CT ABDOMEN  PELVIS FINDINGS  Hepatobiliary: No liver mass or focal lesion. Status  post cholecystectomy. There is mild intern extrahepatic bile duct dilation, common bile duct measuring 9 mm in greatest dimension with distal tapering. This is presumably chronic.  Pancreas: Atrophy, but no mass or inflammation.  Spleen: Normal in size without focal abnormality.  Adrenals/Urinary Tract: No adrenal masses. 5 mm low-density mass in the lower pole the right kidney consistent with cysts. No other renal masses, no stones and no hydronephrosis. Bladder is unremarkable.  Stomach/Bowel: There are colonic diverticula mostly along the sigmoid, but no evidence of diverticulitis. No colonic wall thickening or inflammation. No evidence of bowel obstruction. Stomach is moderately distended but otherwise unremarkable. Small bowel is within normal limits.  Vascular/Lymphatic: Mild aortic atherosclerotic disease. No adenopathy.  Reproductive: Status post hysterectomy. No adnexal masses.  Other: No abdominal wall hernia or abnormality. No abdominopelvic ascites.  MUSCULOSKELETAL FINDINGS  Status post ORIF of a right proximal femur fracture with screws. Fracture appears well healed. No acute fracture. No osteolytic lesions. Focal bone island in the T11 vertebra. Bones are demineralized. There are degenerative changes throughout the spine.  IMPRESSION: CHEST CT  1. No acute findings within the chest. No evidence of pneumonia or pulmonary edema. 2. Mild left coronary artery calcifications. Mild aortic atherosclerosis. ABDOMEN AND PELVIS CT  1. No acute findings within the abdomen or pelvis. No findings to account for left lower quadrant abdominal pain. 2. There are colonic diverticula particularly along the sigmoid, but no evidence of diverticulitis. 3. Status post cholecystectomy and hysterectomy. Mild chronic intra and extrahepatic bile duct dilation. 4. Mild aortic atherosclerosis.   Electronically Signed   By: Lajean Manes M.D.   On: 09/07/2017  16:29   Assessment & Plan:    1. Dysuria Poor historian regarding the dysuria, but the symptoms persists after treating an UTI and with a negative CATH UA Schedule cystoscopy to rule out possible CIS  2. Suprapubic pain Most likely MSK as it radiates to the hip region  3. History of recurrent UTI's  -Reviewed UTI prevention strategies with the patient   4. Vaginal atrophy  - continue the vaginal estrogen cream - advised the patient that it is a maintenance cream and not to be used as needed for burning - She will follow up in 12 months for an exam.                                            Return for cystoscopy.  These notes generated with voice recognition software. I apologize for typographical errors.  Zara Council, [facility] [address] [place], [place] [address] [phone]

## 2018-02-07 ENCOUNTER — Ambulatory Visit: Payer: Medicare Other | Admitting: Urology

## 2018-02-07 ENCOUNTER — Encounter: Payer: Self-pay | Admitting: Urology

## 2018-02-07 VITALS — BP 97/64 | HR 101 | Resp 16 | Ht 65.0 in | Wt 106.0 lb

## 2018-02-07 DIAGNOSIS — Z87440 Personal history of urinary (tract) infections: Secondary | ICD-10-CM | POA: Diagnosis not present

## 2018-02-07 DIAGNOSIS — N952 Postmenopausal atrophic vaginitis: Secondary | ICD-10-CM | POA: Diagnosis not present

## 2018-02-07 DIAGNOSIS — R300 Dysuria: Secondary | ICD-10-CM

## 2018-02-07 LAB — URINALYSIS, COMPLETE
Bilirubin, UA: NEGATIVE
Glucose, UA: NEGATIVE
Ketones, UA: NEGATIVE
Leukocytes, UA: NEGATIVE
Nitrite, UA: NEGATIVE
Protein, UA: NEGATIVE
RBC, UA: NEGATIVE
Specific Gravity, UA: <1.005 — ABNORMAL LOW (ref 1.005–1.030)
Urobilinogen, Ur: 0.2 mg/dL (ref 0.2–1.0)
pH, UA: 5 (ref 5.0–7.5)

## 2018-02-12 NOTE — Progress Notes (Signed)
In and Out Catheterization  Patient is present today for a I & O catheterization due to History of recurrent UTIs. Patient was cleaned and prepped in a sterile fashion with betadine.  A 14FR cath was inserted no complications were noted , 65ml of urine return was noted, urine was yellow in color. A clean urine sample was collected for culture. Bladder was drained  And catheter was removed with out difficulty.    Preformed by: Cristie Hem, CMA

## 2018-02-15 ENCOUNTER — Ambulatory Visit (INDEPENDENT_AMBULATORY_CARE_PROVIDER_SITE_OTHER): Payer: Medicare Other | Admitting: Urology

## 2018-02-15 DIAGNOSIS — Z87440 Personal history of urinary (tract) infections: Secondary | ICD-10-CM

## 2018-02-15 LAB — URINALYSIS, COMPLETE
Bilirubin, UA: NEGATIVE
Glucose, UA: NEGATIVE
Ketones, UA: NEGATIVE
Nitrite, UA: NEGATIVE
Protein, UA: NEGATIVE
Specific Gravity, UA: 1.01 (ref 1.005–1.030)
Urobilinogen, Ur: 0.2 mg/dL (ref 0.2–1.0)
pH, UA: 5 (ref 5.0–7.5)

## 2018-02-15 LAB — MICROSCOPIC EXAMINATION

## 2018-02-15 MED ORDER — LIDOCAINE HCL 2 % EX GEL: 1.0000 "application " | Freq: Once | CUTANEOUS | Status: DC

## 2018-02-15 NOTE — Progress Notes (Signed)
Patient was scheduled for cystoscopy today however preprocedure urinalysis shows 11-30 WBCs.  The cystoscopy was postponed.  Urine culture was ordered.

## 2018-02-18 ENCOUNTER — Ambulatory Visit: Payer: Medicare Other | Admitting: Family Medicine

## 2018-02-18 ENCOUNTER — Telehealth: Payer: Self-pay

## 2018-02-18 ENCOUNTER — Encounter: Payer: Self-pay | Admitting: Family Medicine

## 2018-02-18 ENCOUNTER — Other Ambulatory Visit: Payer: Self-pay | Admitting: Family Medicine

## 2018-02-18 DIAGNOSIS — F411 Generalized anxiety disorder: Secondary | ICD-10-CM

## 2018-02-18 DIAGNOSIS — G8929 Other chronic pain: Secondary | ICD-10-CM

## 2018-02-18 DIAGNOSIS — R300 Dysuria: Secondary | ICD-10-CM

## 2018-02-18 LAB — CULTURE, URINE COMPREHENSIVE

## 2018-02-18 MED ORDER — CILIDINIUM-CHLORDIAZEPOXIDE 2.5-5 MG PO CAPS: ORAL_CAPSULE | ORAL | 1 refills | Status: DC

## 2018-02-18 MED ORDER — SYNTHROID 88 MCG PO TABS: 88.0000 ug | ORAL_TABLET | Freq: Every day | ORAL | 1 refills | Status: DC

## 2018-02-18 MED ORDER — HYDROCODONE-ACETAMINOPHEN 5-325 MG PO TABS: ORAL_TABLET | ORAL | 0 refills | Status: DC

## 2018-02-18 MED ORDER — LORAZEPAM 0.5 MG PO TABS: ORAL_TABLET | ORAL | 2 refills | Status: DC

## 2018-02-18 NOTE — Telephone Encounter (Signed)
Electronic refill request. Clidinium-chlordiazepoxide Last office visit:   02/18/2018 Last Filled:

## 2018-02-18 NOTE — Telephone Encounter (Signed)
Letter sent.

## 2018-02-18 NOTE — Patient Instructions (Signed)
Don't change your meds for now.  I sent refills to the pharmacy.  I'll await the notes from urology.  Recheck in about 3 months.  Update me as needed.  Take care.  Glad to see you.

## 2018-02-18 NOTE — Telephone Encounter (Signed)
-----   Message from Abbie Sons, MD sent at 02/18/2018  7:39 AM EDT ----- Urine culture negative for infection.  Keep rescheduled cystoscopy appointment

## 2018-02-18 NOTE — Progress Notes (Signed)
She has seen urology with plan for recent cystoscopy deferred due to u/a results.  D/w pt.  She has urology f/u pending. She thought that UTIs were affecting her appetite and causing trouble with weight gain.    Indication for chronic opioid: chronic L sciatica and back pain Medication and dose: hydrocodone 5/325mg  # pills per month: 60/month, 180 per 3 month Last UDS date: 08/17/17 Pain contract signed (Y/N): yes Date narcotic database last reviewed (include red flags):  02/18/18  Pain inventory (1-10) Average pain: 5-6/10 Pain now: 5-6/10, worse walking.  My pain is constant Pain is worse with  walking Relief from meds: yes  In the last 24 hours, how much has pain interfered with the following (1-10 greatest interference)? General activity- still able to do some housework, but only with pain meds Relationships with others- some limitation, "I don't mingle too much."    Enjoyment of life- she is frustrated with her current situation, esp with urology concerns.   What time of the day is the pain the worst- with walking.     Sleep is described by patient as "good"  Mobility/function Assistance device:uses a cane in the AMs, not needed later in the day. I gave her information on a false/alert button so she can set that up at her home if needed.   How many minutes can you walk: a few minutes, if walking slowly.   Able to climb steps: yes, slowly, with a rail.   Driving:yes Disabled:no  Bowel or bladder symptoms:see above, burning with urination.  Mood: frustrated.    Physicians involved in care: urology  Any changes since last visit? See above.    H/o anxiety.  Used BZD prn.  No ADE on med.  She has relief from med.    She is trying to put off surgery.  "If it wasn't for the medicine (hydrocodone), I wouldn't be able to do what I do."  She needed refill on levothyroxine.    Meds, vitals, and allergies reviewed.   ROS: Per HPI unless specifically indicated in ROS section    GEN: nad, alert and oriented HEENT: mucous membranes moist NECK: supple w/o LA CV: rrr PULM: ctab, no inc wob ABD: soft, +bs EXT: no edema L lower back ttp.  S/s grossly wnl BLE

## 2018-02-19 NOTE — Assessment & Plan Note (Signed)
Per u rology 

## 2018-02-19 NOTE — Assessment & Plan Note (Signed)
Indication for chronic opioid: chronic L sciatica and back pain Medication and dose: hydrocodone 5/325mg  # pills per month: 60/month, 180 per 3 month Last UDS date: 08/17/17 Pain contract signed (Y/N): yes Date narcotic database last reviewed (include red flags):  02/18/18 No adverse effect of medication.  It improves her quality of life and functional status.  Would continue as is.  She agrees.  She is trying to put off any other back intervention as long as possible.  She would likely not be a candidate for any other back intervention at this point given the issue she is had with dysuria and recurrent UTIs

## 2018-02-19 NOTE — Assessment & Plan Note (Signed)
Risk and cautions discussed with patient.  Continue benzodiazepine as needed, since it does provide some relief for the patient.

## 2018-02-23 ENCOUNTER — Other Ambulatory Visit: Payer: Self-pay | Admitting: Family Medicine

## 2018-02-26 ENCOUNTER — Ambulatory Visit: Payer: Medicare Other | Admitting: Urology

## 2018-02-26 ENCOUNTER — Encounter: Payer: Self-pay | Admitting: Urology

## 2018-02-26 VITALS — BP 150/79 | HR 99 | Resp 16 | Ht 62.0 in | Wt 108.3 lb

## 2018-02-26 DIAGNOSIS — R300 Dysuria: Secondary | ICD-10-CM

## 2018-02-26 DIAGNOSIS — Z87440 Personal history of urinary (tract) infections: Secondary | ICD-10-CM | POA: Diagnosis not present

## 2018-02-26 LAB — URINALYSIS, COMPLETE
Bilirubin, UA: NEGATIVE
Glucose, UA: NEGATIVE
Ketones, UA: NEGATIVE
Nitrite, UA: NEGATIVE
Protein, UA: NEGATIVE
RBC, UA: NEGATIVE
Specific Gravity, UA: <1.005 — ABNORMAL LOW (ref 1.005–1.030)
Urobilinogen, Ur: 0.2 mg/dL (ref 0.2–1.0)
pH, UA: 5.5 (ref 5.0–7.5)

## 2018-02-26 LAB — MICROSCOPIC EXAMINATION: RBC, UA: NONE SEEN /hpf (ref 0–2)

## 2018-02-26 MED ORDER — DOXYCYCLINE HYCLATE 100 MG PO TABS: 100.0000 mg | ORAL_TABLET | Freq: Two times a day (BID) | ORAL | Status: DC

## 2018-02-26 MED ORDER — METHENAMINE HIPPURATE 1 G PO TABS: 1.0000 g | ORAL_TABLET | Freq: Two times a day (BID) | ORAL | 2 refills | Status: DC

## 2018-02-26 MED ORDER — LIDOCAINE HCL 2 % EX GEL
1.0000 "application " | Freq: Once | CUTANEOUS | Status: AC
  Administered 2018-02-26: 1 via URETHRAL

## 2018-02-26 MED ORDER — DOXYCYCLINE HYCLATE 100 MG PO TABS
100.0000 mg | ORAL_TABLET | Freq: Once | ORAL | Status: AC
  Administered 2018-02-26: 100 mg via ORAL

## 2018-02-26 MED ORDER — CIPROFLOXACIN HCL 500 MG PO TABS: 500.0000 mg | ORAL_TABLET | Freq: Once | ORAL | Status: DC

## 2018-02-26 NOTE — Progress Notes (Signed)
   02/26/18  CC: No chief complaint on file.   HPI: 84 year old female with dysuria and history of recurrent UTIs.  She is on low-dose estrogen vaginally  Blood pressure (!) 150/79, pulse 99, resp. rate 16, height 5\' 2"  (1.575 m), weight 108 lb 4.8 oz (49.1 kg), last menstrual period 03/28/1989, SpO2 98 %. NED. A&Ox3.   No respiratory distress   Abd soft, NT, ND Marked atrophic genitalia with patent urethral meatus  Cystoscopy Procedure Note  Patient identification was confirmed, informed consent was obtained, and patient was prepped using Betadine solution.  Lidocaine jelly was administered per urethral meatus.    Preoperative abx where received prior to procedure.    Procedure: - Flexible cystoscope introduced, without any difficulty.   - Thorough search of the bladder revealed:    normal urethral meatus    normal urothelium    no stones    no ulcers     no tumors    no urethral polyps    no trabeculation  - Ureteral orifices were normal in position and appearance.  Post-Procedure: - Patient tolerated the procedure well  Assessment/ Plan: No significant abnormalities identified on cystoscopy.  Continue vaginal estrogen.  Will add Hiprex 1 g twice daily.  Follow-up with Zara Council 1 month   Abbie Sons, MD

## 2018-02-27 ENCOUNTER — Other Ambulatory Visit: Payer: Self-pay | Admitting: Family Medicine

## 2018-02-28 ENCOUNTER — Other Ambulatory Visit: Payer: Self-pay

## 2018-02-28 MED ORDER — METHENAMINE HIPPURATE 1 G PO TABS: 1.0000 g | ORAL_TABLET | Freq: Two times a day (BID) | ORAL | 2 refills | Status: DC

## 2018-03-05 ENCOUNTER — Other Ambulatory Visit: Payer: Self-pay | Admitting: Family Medicine

## 2018-03-05 NOTE — Telephone Encounter (Signed)
Electronic refill request. [name] Last office visit:   02/18/18 Last Filled:    90 capsule 1 02/18/2018  Please advise.

## 2018-03-06 NOTE — Telephone Encounter (Signed)
Noted. Thanks.

## 2018-03-06 NOTE — Telephone Encounter (Signed)
Spoke to patient and was advised that she gets her [name] at [facility] because it is cheaper than [employer] [facility]. Script was sent to [facility] on 02/18/18 #90/1. [facility] will contact [facility] and have them call and get the script transferred to them.

## 2018-03-06 NOTE — Telephone Encounter (Signed)
It looks like this was recently sent.  What is the issue here?  Please let me know.  Thanks.

## 2018-03-07 ENCOUNTER — Other Ambulatory Visit: Payer: Self-pay | Admitting: *Deleted

## 2018-03-07 MED ORDER — CILIDINIUM-CHLORDIAZEPOXIDE 2.5-5 MG PO CAPS: ORAL_CAPSULE | ORAL | 1 refills | Status: DC

## 2018-03-11 ENCOUNTER — Telehealth: Payer: Self-pay | Admitting: *Deleted

## 2018-03-11 NOTE — Telephone Encounter (Signed)
Fax received from [facility], Altha Harm saying that patient is requesting an alternative Rx for the Chlordiazepoxide Clidinium for something that is covered by insurance.

## 2018-03-12 NOTE — Telephone Encounter (Signed)
Left detailed message on voicemail to return call. 

## 2018-03-12 NOTE — Telephone Encounter (Signed)
She needs to check with pharmacy/insurance to get a list of options.  We'll likely never get anywhere with this o/w. Thanks.

## 2018-03-12 NOTE — Telephone Encounter (Signed)
None listed, fax in Dr. Josefine Class In Box.

## 2018-03-12 NOTE — Telephone Encounter (Signed)
Does it list any alternatives that will be covered?  Let me know.  Thanks.

## 2018-03-13 NOTE — Telephone Encounter (Signed)
Noted.  Thanks.  I'll defer for now.  

## 2018-03-13 NOTE — Telephone Encounter (Signed)
Ms [name] called back and she did get message on 03/12/18. Pt said she is aware ins does not pay for clidinium-chlordiazepoxide; pt has already picked up # 39 of the med and nothing further is needed now. Pt said in 3 months she may look at getting a substitute med that ins will pay for but for now nothing further is needed. FYI to Dr [name].

## 2018-03-28 ENCOUNTER — Ambulatory Visit: Payer: Medicare Other | Admitting: Urology

## 2018-04-02 ENCOUNTER — Other Ambulatory Visit: Payer: Self-pay | Admitting: Family Medicine

## 2018-04-30 ENCOUNTER — Ambulatory Visit: Payer: Medicare Other | Admitting: Urology

## 2018-05-16 ENCOUNTER — Other Ambulatory Visit: Payer: Self-pay | Admitting: Family Medicine

## 2018-05-16 DIAGNOSIS — G8929 Other chronic pain: Secondary | ICD-10-CM

## 2018-05-16 NOTE — Telephone Encounter (Addendum)
Name of Medication: Hydrocodone Name of Pharmacy: [facility] Date and Quantity:  180 tablet 0 02/18/2018  Last Office Visit and Type: 02/18/18 Chronic pain mgmt. Next Office Visit and Type: None Last Controlled Substance Agreement Date: 08/07/14 Last UDS: 08/07/14

## 2018-05-17 NOTE — Telephone Encounter (Signed)
Patient advised to make 3 months chronic pain management appointments.

## 2018-05-17 NOTE — Telephone Encounter (Signed)
She needs q 3 month pain appointments.  rx sent.  Thanks.   Indication for chronic opioid: chronic L sciatica and back pain Medication and dose: hydrocodone 5/325mg  # pills per month: 60/month, 180 per 3 month Last UDS date: 08/17/17 Pain contract signed (Y/N): yes Date narcotic database last reviewed (include red flags): 05/17/18

## 2018-05-21 NOTE — Progress Notes (Deleted)
05/22/2018 10:25 PM   Yessenia Maillet [facility] January 28, 1930 [id number]  Referring provider: [name], MD [place], [place] [address]  No chief complaint on file.   HPI: 30 WF with recurrent urinary tract infections, vaginal atrophy and dysuria who presents today for follow up.   Background history Patient is a 84 -year-old Caucasian female who is referred to [name] by, Dr. [name], for recurrent urinary tract infections.  Patient states that she has not had very many urinary tract infections over the last year.  Reviewing her records,  she has had two documented positive cultures in the last 6 months.  One for Klebsiella and 1 for Pseudomonas. She is recently completed her antibiotic for the Pseudomonas infection.  She has been on three antibiotics over the last few weeks.  Her symptoms with a urinary tract infection consist of suprapubic pressure and pain.  She denies dysuria, gross hematuria, suprapubic pain, back pain, abdominal pain or flank pain at this time.  She has not had any recent fevers, chills, nausea or vomiting.  Today, she is experiencing frequency, urgency, burning, nocturia, intermittency, straining to urinate and a weak urinary stream.  Her CATH UA is negative.  PVR is 60 mL.   She does not have a history of nephrolithiasis, GU surgery or GU trauma.  She is not sexually active.  She is postmenopausal.  She admits to constipation.   She does engage in good perineal hygiene. She does not take tub baths.  She does not have incontinence.   Contrast CT performed on 09/07/2017 no adrenal masses. 5 mm low-density mass in the lower pole the right kidney consistent with cysts. No other renal masses, no stones and no hydronephrosis. Bladder is unremarkable.  She is not drinking a lot water daily.   Recently cut back on sodas.  Reviewed referral notes - see above  At her visit on 09/11/2017, she was initiated on vaginal estrogen cream, encouraged to increase her water  intake to 1.5 L daily, encouraged to take a daily probiotic, encouraged to drink cranberry juice and/or take cranberry tablets daily and take vitamin C 1000 mg daily.  At her visit on 09/26/2017, she was experiencing urgency x 0-3, frequency x 4-7, not restricting fluids to avoid visits to the restroom, is engaging in toilet mapping, incontinence x 0-3 and nocturia x 0-3.    She states she has noticed a decrease in the vaginal irritation since starting the Premarin cream. She's not had a UTI since her visit with [name] 2 weeks ago. She denies suprapubic pain and gross hematuria. She has not had fevers, chills, nausea or vomiting.  At her visit on 01/23/2018, she was complaining of dysuria, suprapubic pain and legs aching.  She had the symptoms for almost 2 weeks.  She was applying the vaginal estrogen nightly without relief.   Nothing seemed to make it worse.  She denied fevers, chills, nausea and vomiting.  She denied any hematuria at this time.  Her cath UA was positive for greater than 30 WBCs, 10 RBCs, many bacteria and nitrate positive.  Her urine culture was positive for Klebsiella pneumoniae with a variable resistance pattern.  She was treated with Septa DS.    At her visit on 02/07/2018, she stated her symptoms have lessened, but she is still having the suprapubic pain, dysuria and leg aches.  Patient denies any gross hematuria or flank pain.  Patient denies any fevers, chills, nausea or vomiting.   She  states the suprapubic pain is manly on the left and radiating to her hip.  She could not be more specific concerning the dysuria.  She could not pinpoint where the dysuria was eminating or at what point of the urinary stream the dysuria was occurring.  Her CATH UA was negative.    Cystoscopy on 02/26/2018 with Dr. Bernardo Heater was negative.  She was started on Hiprex 1 grams bid.    Today, ***.    PMH: Past Medical History:  Diagnosis Date  . Anxiety   . Barrett esophagus   . Diplopia    history  of,hospital 2/26-2/27/10  . Diverticulitis    colonoscopy 1995.  CT 01/31/09, 02/02/09  . Hiatal hernia   . Hiatal hernia    EGD 1998, 2007 ( Dr. Sharlett Iles)  . Hyperlipemia   . Hypertension   . Hypothyroidism   . Osteoporosis DXA 2015  . Personal history of colonic polyps    colonoscpy, removed 1995  . Thyroid carcinoma ([place]) 01/1994  . UTI (urinary tract infection)    Chronic    Surgical History: Past Surgical History:  Procedure Laterality Date  . ABDOMINAL HYSTERECTOMY    . CHOLECYSTECTOMY    . gyn surgery     hysterectomy  . HIP SURGERY  08/1993   due to fracture of right hip  . THYROIDECTOMY  04/1994   thyroid cancer    Home Medications:  Allergies as of 05/22/2018      Reactions   Bentyl [dicyclomine Hcl] Other (See Comments)   abd pain   Ciprofloxacin    Can cause GI upset, esp if taken with metamucil, o/w it is tolerated   Cortisone Acetate    REACTION: nausea   Doxycycline Other (See Comments)   GI upset   Gabapentin    REACTION: severe dizziness   Metoclopramide Hcl    Penicillins    Has patient had a PCN reaction causing immediate rash, facial/tongue/throat swelling, SOB or lightheadedness with hypotension: yes Has patient had a PCN reaction causing severe rash involving mucus membranes or skin necrosis: yes Has patient had a PCN reaction that required hospitalization no Has patient had a PCN reaction occurring within the last 10 years: no If all of the above answers are "NO", then may proceed with Cephalosporin use.   Promethazine Hcl    REACTION: unspecified   Septra [sulfamethoxazole-trimethoprim] Other (See Comments)   GI upset but not allergy      Medication List        Accurate as of 05/21/18 10:25 PM. Always use your most recent med list.          B COMPLEX-B12 PO Take by mouth. Taken daily   BIOTENE DRY MOUTH MOISTURIZING Soln Use as directed in the mouth or throat.   Cholecalciferol 1000 units Chew Commonly known as:  [employer] VITAMIN  D3 Chew 1 tablet (1,000 Units total) by mouth daily.   clidinium-chlordiazePOXIDE 5-2.5 MG capsule Commonly known as:  [name] TAKE 1 CAPSULE BY MOUTH EVERY DAY   conjugated estrogens vaginal cream Commonly known as:  PREMARIN Place 1 Applicatorful vaginally daily. Apply 0.5mg  (pea-sized amount)  just inside the vaginal introitus with a finger-tip every night for two weeks and then Monday, Wednesday and Friday nights.   [employer] ALLERGY RELIEF 10 MG dissolvable tablet Generic drug:  loratadine Take 10 mg by mouth daily as needed for allergies.   HYDROcodone-acetaminophen 5-325 MG tablet Commonly known as:  NORCO/VICODIN TAKE 0.5 TO 1 TABLET BY MOUTH EVERY 6  HOURS AS NEEDED FOR PAIN   lisinopril 10 MG tablet Commonly known as:  PRINIVIL,ZESTRIL TAKE 1 TABLET BY MOUTH AT BEDTIME   LORazepam 0.5 MG tablet Commonly known as:  ATIVAN TAKE ONE-HALF TO ONE TABLET BY MOUTH TWOTIMES DAILY AS NEEDED FOR ANXIETY   methenamine 1 g tablet Commonly known as:  HIPREX Take 1 tablet (1 g total) by mouth 2 (two) times daily with a meal.   MIRALAX powder Generic drug:  polyethylene glycol powder Take 17 g by mouth every evening.   mirtazapine 15 MG tablet Commonly known as:  REMERON TAKE ONE TABLET BY MOUTH EVERY NIGHT AT BEDTIME   omeprazole 40 MG capsule Commonly known as:  PRILOSEC TAKE 1 CAPSULE BY MOUTH 45 MINUTES BEFORE BREAKFAST   SYNTHROID 88 MCG tablet Generic drug:  levothyroxine Take 1 tablet (88 mcg total) by mouth daily.   VITAMIN B-12 PO Take by mouth.       Allergies:  Allergies  Allergen Reactions  . Bentyl [Dicyclomine Hcl] Other (See Comments)    abd pain  . Ciprofloxacin     Can cause GI upset, esp if taken with metamucil, o/w it is tolerated  . Cortisone Acetate     REACTION: nausea  . Doxycycline Other (See Comments)    GI upset  . Gabapentin     REACTION: severe dizziness  . Metoclopramide Hcl   . Penicillins     Has patient had a PCN reaction causing  immediate rash, facial/tongue/throat swelling, SOB or lightheadedness with hypotension: yes Has patient had a PCN reaction causing severe rash involving mucus membranes or skin necrosis: yes Has patient had a PCN reaction that required hospitalization no Has patient had a PCN reaction occurring within the last 10 years: no If all of the above answers are "NO", then may proceed with Cephalosporin use.   . Promethazine Hcl     REACTION: unspecified  . Septra [Sulfamethoxazole-Trimethoprim] Other (See Comments)    GI upset but not allergy    Family History: Family History  Problem Relation Age of Onset  . Cancer Brother        Pharyngeal   . Heart disease Brother        MI  . Diabetes Brother   . Diabetes Sister        amputation  . Hypertension Mother   . Stroke Father   . Kidney cancer Neg Hx   . Bladder Cancer Neg Hx     Social History:  reports that she has never smoked. She has never used smokeless tobacco. She reports that she does not drink alcohol or use drugs.  ROS:                                        Physical Exam: LMP 03/28/1989   Constitutional: Well nourished. Alert and oriented, No acute distress. HEENT: [place] AT, moist mucus membranes. Trachea midline, no masses. Cardiovascular: No clubbing, cyanosis, or edema. Respiratory: Normal respiratory effort, no increased work of breathing. GI: Abdomen is soft, non tender, non distended, no abdominal masses. Liver and spleen not palpable.  No hernias appreciated.  Stool sample for occult testing is not indicated.   GU: No CVA tenderness.  No bladder fullness or masses.  Atrophic external genitalia, normal pubic hair distribution, no lesions.  Normal urethral meatus, no lesions, no prolapse, no discharge.   No urethral masses, tenderness and/or  tenderness. No bladder fullness, tenderness or masses. Pale vagina mucosa, poor estrogen effect, no discharge, no lesions, good pelvic support, no cystocele  or rectocele noted.  Cervix, uterus and adnexa are surgically absent.  Anus and perineum are without rashes or lesions.    Skin: No rashes, bruises or suspicious lesions. Lymph: No cervical or inguinal adenopathy. Neurologic: Grossly intact, no focal deficits, moving all 4 extremities. Psychiatric: Normal mood and affect. ***  Constitutional: Well nourished. Alert and oriented, No acute distress. HEENT: [place] AT, moist mucus membranes. Trachea midline, no masses. Cardiovascular: No clubbing, cyanosis, or edema. Respiratory: Normal respiratory effort, no increased work of breathing. GI: Abdomen is soft, non tender, non distended, no abdominal masses. Liver and spleen not palpable.  No hernias appreciated.  Stool sample for occult testing is not indicated.   GU: No CVA tenderness.  No bladder fullness or masses.  Patient with circumcised/uncircumcised phallus. ***Foreskin easily retracted***  Urethral meatus is patent.  No penile discharge. No penile lesions or rashes. Scrotum without lesions, cysts, rashes and/or edema.  Testicles are located scrotally bilaterally. No masses are appreciated in the testicles. Left and right epididymis are normal. Rectal: Patient with  normal sphincter tone. Anus and perineum without scarring or rashes. No rectal masses are appreciated. Prostate is approximately *** grams, *** nodules are appreciated. Seminal vesicles are normal. Skin: No rashes, bruises or suspicious lesions. Lymph: No cervical or inguinal adenopathy. Neurologic: Grossly intact, no focal deficits, moving all 4 extremities. Psychiatric: Normal mood and affect.   Laboratory Data: Lab Results  Component Value Date   WBC 5.2 07/31/2017   HGB 11.9 (L) 07/31/2017   HCT 35.4 (L) 07/31/2017   MCV 92.0 07/31/2017   PLT 205.0 07/31/2017    Lab Results  Component Value Date   CREATININE 0.98 07/31/2017    Lab Results  Component Value Date   HGBA1C 5.7 07/31/2017    Lab Results  Component  Value Date   TSH 0.72 07/31/2017       Component Value Date/Time   CHOL 192 09/20/2017 1348   HDL 58.40 09/20/2017 1348   CHOLHDL 3 09/20/2017 1348   VLDL 19.2 09/20/2017 1348   LDLCALC 114 (H) 09/20/2017 1348    Lab Results  Component Value Date   AST 21 07/31/2017   Lab Results  Component Value Date   ALT 9 07/31/2017   Urinalysis *** See Epic.   I have reviewed the labs.   Pertinent Imaging: ***   Assessment & Plan:    1. Dysuria Cystoscopy was negative for cancer  2. Suprapubic pain Most likely MSK as it radiates to the hip region  3. History of recurrent UTI's  -Reviewed UTI prevention strategies with the patient   4. Vaginal atrophy  - continue the vaginal estrogen cream - advised the patient that it is a maintenance cream and not to be used as needed for burning - She will follow up in 12 months for an exam.                                            No follow-ups on file.  These notes generated with voice recognition software. I apologize for typographical errors.  Zara Council, PA-C  [facility] [address] [place], [place] [address] [phone]

## 2018-05-22 ENCOUNTER — Encounter: Payer: Self-pay | Admitting: Urology

## 2018-05-22 ENCOUNTER — Ambulatory Visit: Payer: Medicare Other | Admitting: Urology

## 2018-06-10 ENCOUNTER — Ambulatory Visit: Payer: Medicare Other | Admitting: Urology

## 2018-06-24 NOTE — Progress Notes (Signed)
06/25/2018 2:43 PM   [facility] [birth year] [id number]  Referring provider: [name], MD [address] [place], [place] [address]  Chief Complaint  Patient presents with  . Follow-up    HPI: 51 WF with recurrent urinary tract infections, vaginal atrophy and vaginal burning/dysuria who presents today for follow up.    Background history Patient is a 84 -year-old Caucasian female who is referred to [name] by, Dr. [name], for recurrent urinary tract infections.  Patient states that she has not had very many urinary tract infections over the last year.  Reviewing her records,  she has had two documented positive cultures in the last 6 months.  One for Klebsiella and 1 for Pseudomonas. She is recently completed her antibiotic for the Pseudomonas infection.  She has been on three antibiotics over the last few weeks.  Her symptoms with a urinary tract infection consist of suprapubic pressure and pain.  She denies dysuria, gross hematuria, suprapubic pain, back pain, abdominal pain or flank pain at this time.  She has not had any recent fevers, chills, nausea or vomiting.  Today, she is experiencing frequency, urgency, burning, nocturia, intermittency, straining to urinate and a weak urinary stream.  Her CATH UA is negative.  PVR is 60 mL.   She does not have a history of nephrolithiasis, GU surgery or GU trauma.  She is not sexually active.  She is postmenopausal.  She admits to constipation.   She does engage in good perineal hygiene. She does not take tub baths.  She does not have incontinence.   Contrast CT performed on 09/07/2017 no adrenal masses. 5 mm low-density mass in the lower pole the right kidney consistent with cysts. No other renal masses, no stones and no hydronephrosis. Bladder is unremarkable.  She is not drinking a lot water daily.   Recently cut back on sodas.  Reviewed referral notes - see above  At her visit on 09/11/2017, she was initiated on vaginal estrogen  cream, encouraged to increase her water intake to 1.5 L daily, encouraged to take a daily probiotic, encouraged to drink cranberry juice and/or take cranberry tablets daily and take vitamin C 1000 mg daily.  At her visit on 09/26/2017, she was experiencing urgency x 0-3, frequency x 4-7, not restricting fluids to avoid visits to the restroom, is engaging in toilet mapping, incontinence x 0-3 and nocturia x 0-3.    She states she has noticed a decrease in the vaginal irritation since starting the Premarin cream. She's not had a UTI since her visit with [name] 2 weeks ago. She denies suprapubic pain and gross hematuria. She has not had fevers, chills, nausea or vomiting.  At her visit on 01/23/2018, she was complaining of dysuria, suprapubic pain and legs aching.  She had the symptoms for almost 2 weeks.  She was applying the vaginal estrogen nightly without relief.   Nothing seemed to make it worse.  She denied fevers, chills, nausea and vomiting.  She denied any hematuria at this time.  Her cath UA was positive for greater than 30 WBCs, 10 RBCs, many bacteria and nitrate positive.  Her urine culture was positive for Klebsiella pneumoniae with a variable resistance pattern.  She was treated with Septa DS.    At her visit on 02/07/2018, she stated her symptoms have lessened, but she is still having the suprapubic pain, dysuria and leg aches.  Patient denies any gross hematuria or flank pain.  Patient denies any fevers,  chills, nausea or vomiting.   She states the suprapubic pain is manly on the left and radiating to her hip.  She could not be more specific concerning the dysuria.  She could not pinpoint where the dysuria was eminating or at what point of the urinary stream the dysuria was occurring.  Her CATH UA was negative.    She underwent cystoscopy on 02/26/2018 which was negative.   Today, she is having mild incontinence.  Patient denies any gross hematuria, dysuria or suprapubic/flank pain.  Patient  denies any fevers, chills, nausea or vomiting.   She is now using Vagisil instead of the vaginal estrogen.    PMH: Past Medical History:  Diagnosis Date  . Anxiety   . Barrett esophagus   . Diplopia    history of,hospital 2/26-2/27/10  . Diverticulitis    colonoscopy 1995.  CT 01/31/09, 02/02/09  . Hiatal hernia   . Hiatal hernia    EGD 1998, 2007 ( Dr. Sharlett Iles)  . Hyperlipemia   . Hypertension   . Hypothyroidism   . Osteoporosis DXA 2015  . Personal history of colonic polyps    colonoscpy, removed 1995  . Thyroid carcinoma ([place]) 01/1994  . UTI (urinary tract infection)    Chronic    Surgical History: Past Surgical History:  Procedure Laterality Date  . ABDOMINAL HYSTERECTOMY    . CHOLECYSTECTOMY    . gyn surgery     hysterectomy  . HIP SURGERY  08/1993   due to fracture of right hip  . THYROIDECTOMY  04/1994   thyroid cancer    Home Medications:  Allergies as of 06/25/2018      Reactions   Bentyl [dicyclomine Hcl] Other (See Comments)   abd pain   Ciprofloxacin    Can cause GI upset, esp if taken with metamucil, o/w it is tolerated   Cortisone Acetate    REACTION: nausea   Doxycycline Other (See Comments)   GI upset   Gabapentin    REACTION: severe dizziness   Metoclopramide Hcl    Penicillins    Has patient had a PCN reaction causing immediate rash, facial/tongue/throat swelling, SOB or lightheadedness with hypotension: yes Has patient had a PCN reaction causing severe rash involving mucus membranes or skin necrosis: yes Has patient had a PCN reaction that required hospitalization no Has patient had a PCN reaction occurring within the last 10 years: no If all of the above answers are "NO", then may proceed with Cephalosporin use.   Promethazine Hcl    REACTION: unspecified   Septra [sulfamethoxazole-trimethoprim] Other (See Comments)   GI upset but not allergy      Medication List        Accurate as of 06/25/18  2:43 PM. Always use your most recent med  list.          B COMPLEX-B12 PO Take by mouth. Taken daily   BIOTENE DRY MOUTH MOISTURIZING Soln Use as directed in the mouth or throat.   Cholecalciferol 1000 units Chew Commonly known as:  CVS VITAMIN D3 Chew 1 tablet (1,000 Units total) by mouth daily.   clidinium-chlordiazePOXIDE 5-2.5 MG capsule Commonly known as:  [name] TAKE 1 CAPSULE BY MOUTH EVERY DAY   conjugated estrogens vaginal cream Commonly known as:  PREMARIN Place 1 Applicatorful vaginally daily. Apply 0.5mg  (pea-sized amount)  just inside the vaginal introitus with a finger-tip every night for two weeks and then Monday, Wednesday and Friday nights.   CVS ALLERGY RELIEF 10 MG dissolvable tablet Generic  drug:  loratadine Take 10 mg by mouth daily as needed for allergies.   HYDROcodone-acetaminophen 5-325 MG tablet Commonly known as:  NORCO/VICODIN TAKE 0.5 TO 1 TABLET BY MOUTH EVERY 6 HOURS AS NEEDED FOR PAIN   lisinopril 10 MG tablet Commonly known as:  PRINIVIL,ZESTRIL TAKE 1 TABLET BY MOUTH AT BEDTIME   LORazepam 0.5 MG tablet Commonly known as:  ATIVAN TAKE ONE-HALF TO ONE TABLET BY MOUTH TWOTIMES DAILY AS NEEDED FOR ANXIETY   methenamine 1 g tablet Commonly known as:  HIPREX Take 1 tablet (1 g total) by mouth 2 (two) times daily with a meal.   MIRALAX powder Generic drug:  polyethylene glycol powder Take 17 g by mouth every evening.   mirtazapine 15 MG tablet Commonly known as:  REMERON TAKE ONE TABLET BY MOUTH EVERY NIGHT AT BEDTIME   omeprazole 40 MG capsule Commonly known as:  PRILOSEC TAKE 1 CAPSULE BY MOUTH 45 MINUTES BEFORE BREAKFAST   SYNTHROID 88 MCG tablet Generic drug:  levothyroxine Take 1 tablet (88 mcg total) by mouth daily.   VITAMIN B-12 PO Take by mouth.       Allergies:  Allergies  Allergen Reactions  . Bentyl [Dicyclomine Hcl] Other (See Comments)    abd pain  . Ciprofloxacin     Can cause GI upset, esp if taken with metamucil, o/w it is tolerated  .  Cortisone Acetate     REACTION: nausea  . Doxycycline Other (See Comments)    GI upset  . Gabapentin     REACTION: severe dizziness  . Metoclopramide Hcl   . Penicillins     Has patient had a PCN reaction causing immediate rash, facial/tongue/throat swelling, SOB or lightheadedness with hypotension: yes Has patient had a PCN reaction causing severe rash involving mucus membranes or skin necrosis: yes Has patient had a PCN reaction that required hospitalization no Has patient had a PCN reaction occurring within the last 10 years: no If all of the above answers are "NO", then may proceed with Cephalosporin use.   . Promethazine Hcl     REACTION: unspecified  . Septra [Sulfamethoxazole-Trimethoprim] Other (See Comments)    GI upset but not allergy    Family History: Family History  Problem Relation Age of Onset  . Cancer Brother        Pharyngeal   . Heart disease Brother        MI  . Diabetes Brother   . Diabetes Sister        amputation  . Hypertension Mother   . Stroke Father   . Kidney cancer Neg Hx   . Bladder Cancer Neg Hx     Social History:  reports that she has never smoked. She has never used smokeless tobacco. She reports that she does not drink alcohol or use drugs.  ROS: UROLOGY Frequent Urination?: No Hard to postpone urination?: No Burning/pain with urination?: No Get up at night to urinate?: No Leakage of urine?: No Urine stream starts and stops?: No Trouble starting stream?: No Do you have to strain to urinate?: No Blood in urine?: No Urinary tract infection?: No Sexually transmitted disease?: No Injury to kidneys or bladder?: No Painful intercourse?: No Weak stream?: No Currently pregnant?: No Vaginal bleeding?: No Last menstrual period?: n  Gastrointestinal Nausea?: No Vomiting?: No Indigestion/heartburn?: No Diarrhea?: No Constipation?: No  Constitutional Fever: No Night sweats?: No Weight loss?: No Fatigue?: No  Skin Skin  rash/lesions?: No Itching?: No  Eyes Blurred vision?: No Double  vision?: No  Ears/Nose/Throat Sore throat?: No Sinus problems?: No  Hematologic/Lymphatic Swollen glands?: No Easy bruising?: No  Cardiovascular Leg swelling?: No Chest pain?: No  Respiratory Cough?: No Shortness of breath?: No  Endocrine Excessive thirst?: No  Musculoskeletal Back pain?: Yes Joint pain?: No  Neurological Headaches?: No Dizziness?: No  Psychologic Depression?: No Anxiety?: Yes  Physical Exam: BP (!) 147/74   Pulse 96   Ht 5\' 2"  (1.575 m)   Wt 105 lb 4.8 oz (47.8 kg)   LMP 03/28/1989   BMI 19.26 kg/m   Constitutional: Well nourished. Alert and oriented, No acute distress. HEENT: [place] AT, moist mucus membranes. Trachea midline, no masses. Cardiovascular: No clubbing, cyanosis, or edema. Respiratory: Normal respiratory effort, no increased work of breathing. Skin: No rashes, bruises or suspicious lesions. Lymph: No cervical or inguinal adenopathy. Neurologic: Grossly intact, no focal deficits, moving all 4 extremities. Psychiatric: Normal mood and affect.  Laboratory Data: Lab Results  Component Value Date   WBC 5.2 07/31/2017   HGB 11.9 (L) 07/31/2017   HCT 35.4 (L) 07/31/2017   MCV 92.0 07/31/2017   PLT 205.0 07/31/2017    Lab Results  Component Value Date   CREATININE 0.98 07/31/2017    Lab Results  Component Value Date   HGBA1C 5.7 07/31/2017    Lab Results  Component Value Date   TSH 0.72 07/31/2017       Component Value Date/Time   CHOL 192 09/20/2017 1348   HDL 58.40 09/20/2017 1348   CHOLHDL 3 09/20/2017 1348   VLDL 19.2 09/20/2017 1348   LDLCALC 114 (H) 09/20/2017 1348    Lab Results  Component Value Date   AST 21 07/31/2017   Lab Results  Component Value Date   ALT 9 07/31/2017   I have reviewed the labs.   Assessment & Plan:    1. Dysuria Cystoscopy normal 02/2018 Resolved  2. Suprapubic pain Resolved  3. History of  recurrent UTI's Reviewed UTI prevention strategies with the patient   4. Vaginal atrophy Using Vagisil.                                          Return if symptoms worsen or fail to improve.  These notes generated with voice recognition software. I apologize for typographical errors.  Zara Council, PA-C  [facility] [address] [place], [place] [address] [phone]

## 2018-06-25 ENCOUNTER — Encounter: Payer: Self-pay | Admitting: Urology

## 2018-06-25 ENCOUNTER — Other Ambulatory Visit: Payer: Self-pay

## 2018-06-25 ENCOUNTER — Ambulatory Visit: Payer: Medicare Other | Admitting: Urology

## 2018-06-25 VITALS — BP 147/74 | HR 96 | Ht 62.0 in | Wt 105.3 lb

## 2018-06-25 DIAGNOSIS — Z87440 Personal history of urinary (tract) infections: Secondary | ICD-10-CM

## 2018-06-25 DIAGNOSIS — R300 Dysuria: Secondary | ICD-10-CM

## 2018-06-25 DIAGNOSIS — N952 Postmenopausal atrophic vaginitis: Secondary | ICD-10-CM

## 2018-06-26 ENCOUNTER — Other Ambulatory Visit: Payer: Self-pay | Admitting: Family Medicine

## 2018-06-26 NOTE — Telephone Encounter (Signed)
Electronic refill request Last refill 02/18/18 #40/2 Last office visit 02/18/18 Upcoming appointment 07/16/18

## 2018-06-26 NOTE — Telephone Encounter (Signed)
Sent. Thanks.   

## 2018-07-01 DIAGNOSIS — D485 Neoplasm of uncertain behavior of skin: Secondary | ICD-10-CM | POA: Diagnosis not present

## 2018-07-01 DIAGNOSIS — L821 Other seborrheic keratosis: Secondary | ICD-10-CM | POA: Diagnosis not present

## 2018-07-01 DIAGNOSIS — Z85828 Personal history of other malignant neoplasm of skin: Secondary | ICD-10-CM | POA: Diagnosis not present

## 2018-07-01 DIAGNOSIS — L570 Actinic keratosis: Secondary | ICD-10-CM | POA: Diagnosis not present

## 2018-07-01 DIAGNOSIS — Z08 Encounter for follow-up examination after completed treatment for malignant neoplasm: Secondary | ICD-10-CM | POA: Diagnosis not present

## 2018-07-01 DIAGNOSIS — C44319 Basal cell carcinoma of skin of other parts of face: Secondary | ICD-10-CM | POA: Diagnosis not present

## 2018-07-04 ENCOUNTER — Other Ambulatory Visit: Payer: Self-pay | Admitting: Family Medicine

## 2018-07-16 ENCOUNTER — Encounter: Payer: Self-pay | Admitting: *Deleted

## 2018-07-16 ENCOUNTER — Ambulatory Visit: Payer: Medicare Other | Admitting: Family Medicine

## 2018-07-16 ENCOUNTER — Encounter: Payer: Self-pay | Admitting: Family Medicine

## 2018-07-16 DIAGNOSIS — R634 Abnormal weight loss: Secondary | ICD-10-CM

## 2018-07-16 DIAGNOSIS — G8929 Other chronic pain: Secondary | ICD-10-CM

## 2018-07-16 DIAGNOSIS — F411 Generalized anxiety disorder: Secondary | ICD-10-CM

## 2018-07-16 MED ORDER — HYDROCODONE-ACETAMINOPHEN 5-325 MG PO TABS: 1.0000 | ORAL_TABLET | Freq: Two times a day (BID) | ORAL | 0 refills | Status: DC

## 2018-07-16 NOTE — Progress Notes (Signed)
Indication for chronic opioid: chronic L sciatica and back pain Medication and dose: hydrocodone 5/325mg  # pills per month: 60/month, 180 per 3 month Last UDS date: 08/17/17 Pain contract signed (Y/N): yes Date narcotic database last reviewed (include red flags): 07/16/18  Pain inventory (1-10) Average pain: 5/10 Pain now: less now, with recent med dose.   My pain is constant, worse with walking.   Pain is worse with walking Relief from meds: yes  In the last 24 hours, how much has pain interfered with the following (1-10 greatest interference)? General activity- still able to do some housework, but only with pain meds Relationships with others- some limitation, "I don't mingle too much."   d/w pt- she still shops, etc.   Enjoyment of life- improved with bladder concerns.   What time of the day is the pain the worst- with walking.     Sleep is described by patient as "good" with med relief.    Constipation d/w pt, esp re: bowel regimen.  Using miralax prn.  Still on librax at baseline.     Mobility/function Assistance device:uses a cane in the AMs, not needed later in the day. I gave her information on a fall/alert button so she can set that up at her home if needed, d/w pt a again. She carries a phone with her.   How many minutes can you walk: a few minutes, if walking slowly.   Able to climb steps: yes, slowly, with a rail.   Driving:yes Disabled:no  Bowel or bladder symptoms:see above.  Mood: frustrated from pain/limitations.    Physicians involved in care: urology prev.   Any changes since last visit? See above.    She is trying to put off surgery.  "If it wasn't for the medicine (hydrocodone), I wouldn't be able to do what I do."  H/o anxiety.  Used BZD prn.  No ADE on med.  She has relief from med.   Meds, vitals, and allergies reviewed.   ROS: Per HPI unless specifically indicated in ROS section   GEN: nad, alert and oriented HEENT: mucous membranes  moist NECK: supple w/o LA CV: rrr PULM: ctab, no inc wob ABD: soft, +bs EXT: no edema L lower back ttp.  S/s grossly wnl BLE

## 2018-07-16 NOTE — Patient Instructions (Addendum)
Don't change your meds for now.  Update me as needed.  Take care.  Glad to see you.  Recheck in about 3 months at a physical.

## 2018-07-17 NOTE — Assessment & Plan Note (Signed)
History of weight loss but her weight is been more stable recently.  Continue to follow clinically.  No alarming symptoms.  She has been trying to increase her calorie intake with healthy foods.

## 2018-07-17 NOTE — Assessment & Plan Note (Addendum)
Indication for chronic opioid: chronic L sciatica and back pain Medication and dose: hydrocodone 5/325mg  # pills per month: 60/month, 180 per 3 month Last UDS date: 08/17/17 Pain contract signed (Y/N): yes Date narcotic database last reviewed (include red flags): 07/16/18  Continue as is.  No adverse effect on medication other than tendency towards constipation and she is trying to manage that with a bowel regimen.  Routine opiate cautions discussed with patient.  Contract updated today. >25 minutes spent in face to face time with patient, >50% spent in counselling or coordination of care.

## 2018-07-17 NOTE — Assessment & Plan Note (Signed)
H/o anxiety.  Used BZD prn.  No ADE on med.  She has relief from med.  Continue as is.

## 2018-07-26 DIAGNOSIS — C44319 Basal cell carcinoma of skin of other parts of face: Secondary | ICD-10-CM | POA: Diagnosis not present

## 2018-07-30 ENCOUNTER — Ambulatory Visit: Payer: Medicare Other | Admitting: Family Medicine

## 2018-08-06 ENCOUNTER — Other Ambulatory Visit: Payer: Self-pay | Admitting: Family Medicine

## 2018-08-06 ENCOUNTER — Ambulatory Visit: Payer: Medicare Other | Admitting: Family Medicine

## 2018-08-06 NOTE — Telephone Encounter (Signed)
Name of Medication: Hydrocodone Name of Pharmacy: [facility] or Written Date and Quantity:  180 tablet 0 07/16/2018  Last Office Visit and Type: 07/16/18 Pain Management Next Office Visit and Type: 10/15/18 CPE Last Controlled Substance Agreement Date: 08/07/14 Last UDS: 08/07/14

## 2018-08-07 NOTE — Telephone Encounter (Signed)
Hydrocodone was printed prev and she should use that rx when needed.  Okay with me to change to generic levothyroxine, no change in sig.  Thanks.

## 2018-08-07 NOTE — Telephone Encounter (Signed)
Should have already been addressed by prev rxs.  Did the pharmacy limit her to 30 day supply?  What was the issue?  Let me know.  Thanks.

## 2018-08-07 NOTE — Telephone Encounter (Signed)
Spoke to [place] at [facility] ans was advised that the last time they filled the Hydrocodone was 05/17/18 #180. Looks like script was printed at patient's office visit on 07/16/18. Gerald Stabs also stated that they got the refill on Synthroid for brand name and insurance will not cover the brand name. Please advise if this should be changed to generic or PA done?

## 2018-08-08 NOTE — Telephone Encounter (Signed)
Patient and pharmacy advised

## 2018-09-05 ENCOUNTER — Other Ambulatory Visit: Payer: Self-pay | Admitting: Family Medicine

## 2018-09-05 NOTE — Telephone Encounter (Signed)
Electronic refill request. Clidinium Chlordiazepoxide Last office visit:   07/16/18 Last Filled:    90 capsule 1 03/07/2018  Please advise.

## 2018-09-05 NOTE — Telephone Encounter (Signed)
Sent. Thanks.   

## 2018-09-09 ENCOUNTER — Other Ambulatory Visit: Payer: Self-pay | Admitting: Family Medicine

## 2018-09-09 NOTE — Telephone Encounter (Signed)
Electronic refill request Lorazepam and Remeron Last office visit 07/16/18 Last refill Lorazepam 06/26/18 #40/2 Last refill Remeron 10/04/17 #90/3

## 2018-09-10 NOTE — Telephone Encounter (Signed)
Sent. Thanks.   

## 2018-10-03 ENCOUNTER — Other Ambulatory Visit: Payer: Self-pay | Admitting: Family Medicine

## 2018-10-14 ENCOUNTER — Other Ambulatory Visit: Payer: Self-pay | Admitting: Family Medicine

## 2018-10-14 NOTE — Telephone Encounter (Signed)
Name of Medication: Hydrocodone Name of Pharmacy: [facility] or Written Date and Quantity: 180 tablet 0 07/16/2018  Last Office Visit and Type: 07/16/18 Pain Mgmt Next Office Visit and Type: 10/15/18 CPE Last Controlled Substance Agreement Date: 08/07/14 Last UDS: 08/17/17

## 2018-10-15 ENCOUNTER — Encounter: Payer: Self-pay | Admitting: Family Medicine

## 2018-10-15 ENCOUNTER — Ambulatory Visit (INDEPENDENT_AMBULATORY_CARE_PROVIDER_SITE_OTHER): Payer: Medicare Other | Admitting: Family Medicine

## 2018-10-15 VITALS — BP 142/70 | HR 104 | Temp 97.6°F | Ht 62.0 in | Wt 101.5 lb

## 2018-10-15 DIAGNOSIS — E039 Hypothyroidism, unspecified: Secondary | ICD-10-CM

## 2018-10-15 DIAGNOSIS — E559 Vitamin D deficiency, unspecified: Secondary | ICD-10-CM | POA: Diagnosis not present

## 2018-10-15 DIAGNOSIS — Z0000 Encounter for general adult medical examination without abnormal findings: Secondary | ICD-10-CM

## 2018-10-15 DIAGNOSIS — Z23 Encounter for immunization: Secondary | ICD-10-CM | POA: Diagnosis not present

## 2018-10-15 DIAGNOSIS — R300 Dysuria: Secondary | ICD-10-CM

## 2018-10-15 DIAGNOSIS — R634 Abnormal weight loss: Secondary | ICD-10-CM | POA: Diagnosis not present

## 2018-10-15 DIAGNOSIS — G8929 Other chronic pain: Secondary | ICD-10-CM | POA: Diagnosis not present

## 2018-10-15 DIAGNOSIS — K589 Irritable bowel syndrome without diarrhea: Secondary | ICD-10-CM

## 2018-10-15 DIAGNOSIS — F411 Generalized anxiety disorder: Secondary | ICD-10-CM

## 2018-10-15 DIAGNOSIS — I10 Essential (primary) hypertension: Secondary | ICD-10-CM | POA: Diagnosis not present

## 2018-10-15 DIAGNOSIS — Z7189 Other specified counseling: Secondary | ICD-10-CM

## 2018-10-15 LAB — POC URINALSYSI DIPSTICK (AUTOMATED)
Bilirubin, UA: NEGATIVE
Glucose, UA: NEGATIVE
Ketones, UA: NEGATIVE
Nitrite, UA: POSITIVE
Protein, UA: NEGATIVE
Spec Grav, UA: 1.015 (ref 1.010–1.025)
Urobilinogen, UA: 0.2 E.U./dL
pH, UA: 6 (ref 5.0–8.0)

## 2018-10-15 MED ORDER — HYDROCODONE-ACETAMINOPHEN 5-325 MG PO TABS: 1.0000 | ORAL_TABLET | Freq: Two times a day (BID) | ORAL | 0 refills | Status: DC

## 2018-10-15 NOTE — Patient Instructions (Addendum)
Flu shot today.  Go to the lab on the way out.  We'll contact you with your lab report. Take care.  Glad to see you.

## 2018-10-15 NOTE — Telephone Encounter (Signed)
Given to patient at [place].

## 2018-10-15 NOTE — Progress Notes (Signed)
I have personally reviewed the Medicare Annual Wellness questionnaire and have noted 1. The patient's medical and social history 2. Their use of alcohol, tobacco or illicit drugs 3. Their current medications and supplements 4. The patient's functional ability including ADL's, fall risks, home safety risks and hearing or visual             impairment. 5. Diet and physical activities 6. Evidence for depression or mood disorders  The patients weight, height, BMI have been recorded in the chart and visual acuity is per eye clinic.  I have made referrals, counseling and provided education to the patient based review of the above and I have provided the pt with a written personalized care plan for preventive services.  Provider list updated- see scanned forms.  Routine anticipatory guidance given to patient.  See health maintenance. The possibility exists that previously documented standard health maintenance information may have been brought forward from a previous encounter into this note.  If needed, that same information has been updated to reflect the current situation based on today's encounter.    Flu today  Shingles 2011 PNA deferred today per patient report.   tetanus 2013 Colonoscopy not due given age >39.  Breast cancer screening-  Encouraged, she'll consider.   DXA not due, d/w pt.  She declined treatment prv.  Advance directive- friend May Lamm designated if patient were incapacitated.  Cognitive function addressed- see scanned forms- and if abnormal then additional documentation follows.   =================================================== Indication for chronic opioid:chronicL sciaticaand back pain Medication and dose:hydrocodone 5/325mg  # pills per month:60/month, 180 per 3 month Last UDS date:08/17/17 Pain contract signed (Y/N):yes Date narcotic database last reviewed (include red flags):10/18/18  Pain inventory (1-10) Average pain:"sometimes really bad, down the  left leg" Pain WFU:XNAT now, with recent med dose.   My pain is constant, worse with walking.   Pain is worse withwalking and sitting, better laying down.   Relief from meds:some but still with pain.    In the last 24 hours, how much has pain interfered with the following (1-10 greatest interference)? General activity- still able to do some housework, but only with pain meds Relationships with others- she still shops, etc.     Enjoyment of life- some limitation, but she still gets out.    What time of the day is the pain the worst- with walking and prolonged sitting.   Sleep is described by patient as"good" with med relief.    Constipation d/w pt, esp re: bowel regimen.  Using miralax prn.   Mobility/function Assistance device:uses a cane in the AMs, not needed later in the day.I prev gave her information on a fall/alert button so she can set that up at her home if needed, d/w pt a again. She carries a phone with her.   How many minutes can you walk:a few minutes, if walking slowly. Able to climb steps:yes, slowly, with a rail. Driving:yes Disabled:no  Bowel or bladder symptoms: no new issues.   Mood:frustrated from pain/limitations.    Physicians involved in care:urologyprev.   Any changes since last visit? No   She is trying to put off surgery."If it wasn't for the medicine (hydrocodone), I wouldn't be able todo what I still do."  H/o anxiety. Used BZD prn. No ADE on med. She has relief from med.   Some burning with urination.  She is still using estrogen cream and had prev helped.  Discussed with patient about recheck urine culture today.  See notes on  labs.  Hypertension:    Using medication without problems or lightheadedness: yes Chest pain with exertion:no Edema:no Short of breath:no  Hypothyroidism.  Due for labs.  No dysphagia.  Compliant.  She takes med early, empty stomach.   See notes on labs.  IBS.  She had trouble paying for librax.   It helped when she was on it but has sig cramping/diarrhea when off med.  She is trying to get information/help from the pharmacy to make the med more affordable.  She is trying to supplement with ensure and protein given her weight loss related to diarrhea when off librax.  D/w pt about monitoring weight.    She has a chipped tooth w/o pain. She has dental f/u pending.   =================================================  PMH and SH reviewed  Meds, vitals, and allergies reviewed.   ROS: Per HPI.  Unless specifically indicated otherwise in HPI, the patient denies:  General: fever. Eyes: acute vision changes ENT: sore throat Cardiovascular: chest pain Respiratory: SOB GI: vomiting GU: dysuria Musculoskeletal: acute back pain Derm: acute rash Neuro: acute motor dysfunction Psych: worsening mood Endocrine: polydipsia Heme: bleeding Allergy: hayfever  GEN: nad, alert and oriented HEENT: mucous membranes moist NECK: supple w/o LA CV: rrr PULM: ctab, no inc wob ABD: soft, +bs EXT: no edema L lower back ttp.  S/s grossly wnl BLE L SLR positive.

## 2018-10-15 NOTE — Telephone Encounter (Signed)
Please hold for OV today.

## 2018-10-16 LAB — LIPID PANEL
Cholesterol: 187 mg/dL (ref 0–200)
HDL: 64.1 mg/dL (ref >39.00)
LDL Cholesterol: 108 mg/dL — ABNORMAL HIGH (ref 0–99)
NonHDL: 122.49
Total CHOL/HDL Ratio: 3
Triglycerides: 70 mg/dL (ref 0.0–149.0)
VLDL: 14 mg/dL (ref 0.0–40.0)

## 2018-10-16 LAB — COMPREHENSIVE METABOLIC PANEL
ALT: 11 U/L (ref 0–35)
AST: 23 U/L (ref 0–37)
Albumin: 4.2 g/dL (ref 3.5–5.2)
Alkaline Phosphatase: 68 U/L (ref 39–117)
BUN: 16 mg/dL (ref 6–23)
CO2: 30 mEq/L (ref 19–32)
Calcium: 9.2 mg/dL (ref 8.4–10.5)
Chloride: 96 mEq/L (ref 96–112)
Creatinine, Ser: 0.85 mg/dL (ref 0.40–1.20)
GFR: 67.37 mL/min (ref >60.00)
Glucose, Bld: 126 mg/dL — ABNORMAL HIGH (ref 70–99)
Potassium: 4.3 mEq/L (ref 3.5–5.1)
Sodium: 134 mEq/L — ABNORMAL LOW (ref 135–145)
Total Bilirubin: 0.5 mg/dL (ref 0.2–1.2)
Total Protein: 7.6 g/dL (ref 6.0–8.3)

## 2018-10-16 LAB — CBC WITH DIFFERENTIAL/PLATELET
Basophils Absolute: 0 10*3/uL (ref 0.0–0.1)
Basophils Relative: 0.5 % (ref 0.0–3.0)
Eosinophils Absolute: 0.1 10*3/uL (ref 0.0–0.7)
Eosinophils Relative: 2 % (ref 0.0–5.0)
HCT: 38.7 % (ref 36.0–46.0)
Hemoglobin: 13.1 g/dL (ref 12.0–15.0)
Lymphocytes Relative: 15.2 % (ref 12.0–46.0)
Lymphs Abs: 1 10*3/uL (ref 0.7–4.0)
MCHC: 33.7 g/dL (ref 30.0–36.0)
MCV: 90.5 fl (ref 78.0–100.0)
Monocytes Absolute: 0.5 10*3/uL (ref 0.1–1.0)
Monocytes Relative: 6.7 % (ref 3.0–12.0)
Neutro Abs: 5.2 10*3/uL (ref 1.4–7.7)
Neutrophils Relative %: 75.6 % (ref 43.0–77.0)
Platelets: 217 10*3/uL (ref 150.0–400.0)
RBC: 4.27 Mil/uL (ref 3.87–5.11)
RDW: 12.6 % (ref 11.5–15.5)
WBC: 6.9 10*3/uL (ref 4.0–10.5)

## 2018-10-16 LAB — TSH: TSH: 0.36 u[IU]/mL (ref 0.35–4.50)

## 2018-10-16 LAB — VITAMIN D 25 HYDROXY (VIT D DEFICIENCY, FRACTURES): VITD: 18.65 ng/mL — ABNORMAL LOW (ref 30.00–100.00)

## 2018-10-18 ENCOUNTER — Telehealth: Payer: Self-pay | Admitting: Family Medicine

## 2018-10-18 MED ORDER — CIPROFLOXACIN HCL 250 MG PO TABS: 250.0000 mg | ORAL_TABLET | Freq: Two times a day (BID) | ORAL | 0 refills | Status: DC

## 2018-10-18 NOTE — Assessment & Plan Note (Signed)
Unclear source.  Benign abdominal exam.  See notes on labs.

## 2018-10-18 NOTE — Assessment & Plan Note (Addendum)
Indication for chronic opioid:chronicL sciaticaand back pain Medication and dose:hydrocodone 5/325mg  # pills per month:60/month, 180 per 3 month Last UDS date:10/15/18 Pain contract signed (Y/N):yes Date narcotic database last reviewed (include red flags):10/18/18  She still having significant pain that requires hydrocodone, with some relief from medication.  She is able to tolerate her current medications as is.  She is trying to put off any type of surgery at this point and wanted to continue with her current medication as is.  I think this is likely the least risky option, given her age she would not be a low risk candidate for surgery.  She agrees with the plan.  She will update me as needed and recheck periodically.   Continue bowel regimen.

## 2018-10-18 NOTE — Assessment & Plan Note (Signed)
Used BZD prn. No ADE on med. She has relief from med. Caution given especially with her other medications.  She does have some relief from medication without sedation.

## 2018-10-18 NOTE — Assessment & Plan Note (Signed)
Due for labs.  No dysphagia.  Compliant.  She takes med early, empty stomach.   See notes on labs.

## 2018-10-18 NOTE — Assessment & Plan Note (Signed)
She had trouble paying for librax.  It helped when she was on it but has sig cramping/diarrhea when off med.  She is trying to get information/help from the pharmacy to make the med more affordable.  She is trying to supplement with ensure and protein given her weight loss related to diarrhea when off librax.  D/w pt about monitoring weight.

## 2018-10-18 NOTE — Assessment & Plan Note (Signed)
Continue estrogen cream.  See notes on labs.

## 2018-10-18 NOTE — Telephone Encounter (Signed)
Call pt.  ucx positive.  She should be able to take cipro- don't take with metamucil.  rx sent.  I'm still looking back at her other labs- no emergent values and we'll be in touch about those on Monday.  Thanks.

## 2018-10-18 NOTE — Telephone Encounter (Signed)
Patient advised.

## 2018-10-18 NOTE — Assessment & Plan Note (Signed)
Reasonable control for now.  See notes on labs.  No change in meds at this point.

## 2018-10-18 NOTE — Assessment & Plan Note (Signed)
Flu today  Shingles 2011 PNA deferred today per patient report.   tetanus 2013 Colonoscopy not due given age >34.  Breast cancer screening-  Encouraged, she'll consider.   DXA not due, d/w pt.  She declined treatment prv.  Advance directive- friend May Lamm designated if patient were incapacitated.  Cognitive function addressed- see scanned forms- and if abnormal then additional documentation follows.

## 2018-10-18 NOTE — Assessment & Plan Note (Signed)
Advance directive- friend May Lamm designated if patient were incapacitated.

## 2018-10-19 ENCOUNTER — Other Ambulatory Visit: Payer: Self-pay | Admitting: Family Medicine

## 2018-10-19 LAB — PAIN MGMT, PROFILE 8 W/CONF, U
6 Acetylmorphine: NEGATIVE ng/mL (ref <10)
Alcohol Metabolites: NEGATIVE ng/mL (ref <500)
Alphahydroxyalprazolam: NEGATIVE ng/mL (ref <25)
Alphahydroxymidazolam: NEGATIVE ng/mL (ref <50)
Alphahydroxytriazolam: NEGATIVE ng/mL (ref <50)
Aminoclonazepam: NEGATIVE ng/mL (ref <25)
Amphetamines: NEGATIVE ng/mL (ref <500)
Benzodiazepines: POSITIVE ng/mL — AB (ref <100)
Buprenorphine, Urine: NEGATIVE ng/mL (ref <5)
Cocaine Metabolite: NEGATIVE ng/mL (ref <150)
Codeine: NEGATIVE ng/mL (ref <50)
Creatinine: 41.1 mg/dL
Hydrocodone: 440 ng/mL — ABNORMAL HIGH (ref <50)
Hydromorphone: 137 ng/mL — ABNORMAL HIGH (ref <50)
Hydroxyethylflurazepam: NEGATIVE ng/mL (ref <50)
Lorazepam: 266 ng/mL — ABNORMAL HIGH (ref <50)
MDMA: NEGATIVE ng/mL (ref <500)
Marijuana Metabolite: NEGATIVE ng/mL (ref <20)
Morphine: NEGATIVE ng/mL (ref <50)
Nordiazepam: NEGATIVE ng/mL (ref <50)
Norhydrocodone: 783 ng/mL — ABNORMAL HIGH (ref <50)
Noroxycodone: NEGATIVE ng/mL (ref <50)
Opiates: POSITIVE ng/mL — AB (ref <100)
Oxazepam: 538 ng/mL — ABNORMAL HIGH (ref <50)
Oxidant: NEGATIVE ug/mL (ref <200)
Oxycodone: NEGATIVE ng/mL (ref <100)
Oxycodone: NEGATIVE ng/mL (ref <50)
Oxymorphone: NEGATIVE ng/mL (ref <50)
Temazepam: NEGATIVE ng/mL (ref <50)
pH: 7.23 (ref 4.5–9.0)

## 2018-10-19 LAB — URINE CULTURE
MICRO NUMBER:: 91392670
SPECIMEN QUALITY:: ADEQUATE

## 2018-10-21 NOTE — Telephone Encounter (Signed)
Electronic refill request. [name] Last office visit:   10/15/18 CPE Last Filled:    90 capsule 1 09/05/2018  Please advise.

## 2018-10-22 ENCOUNTER — Other Ambulatory Visit: Payer: Self-pay | Admitting: Family Medicine

## 2018-10-22 DIAGNOSIS — E559 Vitamin D deficiency, unspecified: Secondary | ICD-10-CM

## 2018-10-22 MED ORDER — VITAMIN D (ERGOCALCIFEROL) 1.25 MG (50000 UNIT) PO CAPS: 50000.0000 [IU] | ORAL_CAPSULE | ORAL | 0 refills | Status: DC

## 2018-10-22 MED ORDER — MIRTAZAPINE 30 MG PO TABS: ORAL_TABLET | ORAL | 3 refills | Status: DC

## 2018-10-22 NOTE — Telephone Encounter (Signed)
Please check with pharmacy on this.  I was sent with 90 and 1 RF.  Did they only fill each rx with 30?

## 2018-10-22 NOTE — Telephone Encounter (Signed)
Rx was filled for 30 but pharmacist says she has the remaining available.

## 2018-10-23 ENCOUNTER — Telehealth: Payer: Self-pay

## 2018-10-23 NOTE — Telephone Encounter (Signed)
Pt returned call to [facility] for lab results.

## 2018-10-23 NOTE — Telephone Encounter (Signed)
Left message for pt to call office. Per Dr [name]: Please have her update me about her weight and appetite on the higher dose of remeron at night- inc to 30mg .If she doesn't have inc in weight with that or if she has any early satiety, then I would like to get her back to the GI clinic.

## 2018-10-23 NOTE — Telephone Encounter (Signed)
Spoke to pt. She will start the mirtazapine tonight and will update Dr [name].

## 2018-11-04 ENCOUNTER — Ambulatory Visit (INDEPENDENT_AMBULATORY_CARE_PROVIDER_SITE_OTHER): Payer: Medicare Other | Admitting: Family Medicine

## 2018-11-04 ENCOUNTER — Encounter: Payer: Self-pay | Admitting: Family Medicine

## 2018-11-04 VITALS — BP 112/72 | HR 92 | Temp 97.9°F | Ht 62.0 in | Wt 101.5 lb

## 2018-11-04 DIAGNOSIS — S51819A Laceration without foreign body of unspecified forearm, initial encounter: Secondary | ICD-10-CM

## 2018-11-04 DIAGNOSIS — R634 Abnormal weight loss: Secondary | ICD-10-CM | POA: Diagnosis not present

## 2018-11-04 MED ORDER — MIRTAZAPINE 15 MG PO TABS: ORAL_TABLET | ORAL | Status: DC

## 2018-11-04 NOTE — Patient Instructions (Signed)
We'll call about the GI appointment.   We make arrangements for referrals, extra imaging, and other appointments based on the urgency of the situation. Referrals are handled based on the clinical situation, not in the order that they are placed. If you do not see one of our referral coordinators on the way out of the clinic today, then you should expect a call in the next 1 to 2 weeks. We work diligently to process all referrals as quickly as possible.    Keep the scrape clean and covered as needed.  Take care.  Glad to see you.

## 2018-11-04 NOTE — Progress Notes (Signed)
Prev UTI treated.   She felt better after prev treatment.    She hasn't started high dose vit D replacement yet.  She wanted to hold for now, see below, until GI sx resolved.   D/w pt about about her weight and appetite on the higher dose of remeron at night- prev inc to 30mg .she tried the higher dose but didn't tolerate it- she felt jittery on the higher dose.  She cut back to 15mg .    She didn't lose weight in the meantime but didn't gain either.  D/w pt that if she doesn't have inc in weight with that, then I would like to get her back to the GI clinic. she agreed with GI f/u today.  She is taking ensure on top of her routine meals.    R arm skin tear, she was taking the trash out and the can scraped her arm (this was about 4 weeks ago).  She has been using polysporin.  She wanted to get that checked today.    Meds, vitals, and allergies reviewed.   ROS: Per HPI unless specifically indicated in ROS section   nad ncat rrr 3 x 0.5cm linear scrape on the L forearm.  Superficial, doesn't look infected.  No spreading redness.   Normal radial pulse.

## 2018-11-06 DIAGNOSIS — S51819A Laceration without foreign body of unspecified forearm, initial encounter: Secondary | ICD-10-CM | POA: Insufficient documentation

## 2018-11-06 NOTE — Assessment & Plan Note (Signed)
Looks to be slowly healing.  Covered with nonstick bandage.  Routine cautions given.  Update me as needed.  She agrees.  Does not appear infected.

## 2018-11-06 NOTE — Assessment & Plan Note (Signed)
History of weight loss noted.  She is already supplementing her meals with Ensure.  Refer back to GI.  She agrees.  She did not tolerate higher dose of Remeron.  I appreciate GIs input.

## 2018-11-19 ENCOUNTER — Other Ambulatory Visit: Payer: Self-pay | Admitting: Family Medicine

## 2018-12-03 DIAGNOSIS — Z08 Encounter for follow-up examination after completed treatment for malignant neoplasm: Secondary | ICD-10-CM | POA: Diagnosis not present

## 2018-12-03 DIAGNOSIS — D0462 Carcinoma in situ of skin of left upper limb, including shoulder: Secondary | ICD-10-CM | POA: Diagnosis not present

## 2018-12-03 DIAGNOSIS — L821 Other seborrheic keratosis: Secondary | ICD-10-CM | POA: Diagnosis not present

## 2018-12-03 DIAGNOSIS — D485 Neoplasm of uncertain behavior of skin: Secondary | ICD-10-CM | POA: Diagnosis not present

## 2018-12-03 DIAGNOSIS — Z85828 Personal history of other malignant neoplasm of skin: Secondary | ICD-10-CM | POA: Diagnosis not present

## 2018-12-12 ENCOUNTER — Encounter: Payer: Self-pay | Admitting: Gastroenterology

## 2018-12-12 ENCOUNTER — Ambulatory Visit (INDEPENDENT_AMBULATORY_CARE_PROVIDER_SITE_OTHER): Payer: Medicare Other | Admitting: Gastroenterology

## 2018-12-12 VITALS — BP 150/84 | HR 88 | Ht 61.5 in | Wt 101.1 lb

## 2018-12-12 DIAGNOSIS — R634 Abnormal weight loss: Secondary | ICD-10-CM

## 2018-12-12 DIAGNOSIS — R1032 Left lower quadrant pain: Secondary | ICD-10-CM | POA: Diagnosis not present

## 2018-12-12 DIAGNOSIS — R1310 Dysphagia, unspecified: Secondary | ICD-10-CM

## 2018-12-12 NOTE — Patient Instructions (Addendum)
If you are age 83 or older, your body mass index should be between 23-30. Your Body mass index is 18.8 kg/m. If this is out of the aforementioned range listed, please consider follow up with your Primary Care Provider.  If you are age 52 or younger, your body mass index should be between 19-25. Your Body mass index is 18.8 kg/m. If this is out of the aformentioned range listed, please consider follow up with your Primary Care Provider.   You have been scheduled for a CT scan of the abdomen and pelvis at [facility] are scheduled on Monday,  12-16-18 at 1:30pm. You should arrive 15 minutes prior to your appointment time for registration. Please follow the written instructions below on the day of your exam:  WARNING: IF YOU ARE ALLERGIC TO IODINE/X-RAY DYE, PLEASE NOTIFY RADIOLOGY IMMEDIATELY AT [phone]! YOU WILL BE GIVEN A 13 HOUR PREMEDICATION PREP.  1) Do not eat anything after 9:30am  (4 hours prior to your test)  2) You have been given 2 bottles of oral contrast to drink. The solution may taste better if refrigerated, but do NOT add ice or any other liquid to this solution. Shake well before drinking.    Drink 1 bottle of contrast @ 12:00pm (1 hour and 1/2 prior to your exam)  Drink 1/2 bottle of contrast @ 1:00pm (1/2 hour prior to your exam)   BRING THE OTHER 1/2 BOTTLE OF CONTRAST WITH YOU  You may take any medications as prescribed with a small amount of water, if necessary. If you take any of the following medications: METFORMIN, GLUCOPHAGE, GLUCOVANCE, AVANDAMET, RIOMET, FORTAMET, [place] MET, JANUMET, GLUMETZA or METAGLIP, you MAY be asked to HOLD this medication 48 hours AFTER the exam.  The purpose of you drinking the oral contrast is to aid in the visualization of your intestinal tract. The contrast solution may cause some diarrhea. Depending on your individual set of symptoms, you may also receive an intravenous injection of x-ray contrast/dye.  Plan on being at [facility] for 30 minutes or longer, depending on the type of exam you are having performed.  This test typically takes 30-45 minutes to complete.  If you have any questions regarding your exam or if you need to reschedule, you may call [phone]  between the hours of 8:00 am and 5:00 pm, Monday-Friday.  _____________________________________________________   Thank you for entrusting me with your care and for choosing [facility], Dr. Cass Cellar

## 2018-12-12 NOTE — Progress Notes (Signed)
HPI :  85 y/o female:  She is here for a follow up visit. She had a prior thyroid cancer, and is s/p thyroidectomy in 1995 as well as radiation therapy.  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 That appeared to resolve her dysphagia. Denies any eating problems right now. She previously had a question of celiac disease on prior endoscopy. I repeated her celiac labs are negative and duodenal biopsies were normal on EGD.  She reportedly continues to lose weight. She states she is down about 30 pounds over the past 3 years. She has lost 8 pounds since I last visit with her in April 2018. She eats 2 meals a day and snacks as well, and ensure shakes. She thinks since using Ensure her weight loss has stopped and has been stable in recent months, but has not regained weight. She complains of poor appetite. Was placed on Remeron 15 mg which helped. It was increased to 30 mg daily at bedtime and this caused her to have side effects and she decreased back to 15 mg. She appears to tolerate this dose but still has issues with appetite. No nausea or vomiting. No reflux. She does endorse some occasional left lower quadrant pain she attributes to her diverticulosis. She is using MiraLAX for the most part every day to prevent constipation. When she does not use it as constipation to severe left lower quadrant pain which bothers her. She denies any blood in her stools. Her last colonoscopy was 2012 with Dr. Sharlett Iles. She had a severe left sided diverticular related stricture which was very difficult to traverse. She had no polyps reported excellent prep. She reports intermittent left lower quadrant pain however has a hard time stating how frequently this occurs. Does not sound like it bothers her too frequently. She has about 2 bowel movements per day with the Miralax.   Colonoscopy in 2012 with finding of severe left-sided diverticulosis, no polyps  EGD in  2012 - Barrett's esophagus no dysplasia (irregular z-line) and chronic atrophic gastric mucosa as well as small hiatal hernia. biopsies showed chronic gastritis H. pylori negative peptic duodenitis . EGD 03/08/2017 - cricopharyngeal stenosis noted, dilated to 68mm, normal duodenal biopsies, no celiac, TTG negative  CT scan abdomen / pelvis / chest 08/2017 - no acute findings, diverticulosis sigmoid, otherwise normal - CBD 28mm post cholecystectomy  Past Medical History:  Diagnosis Date  . Anxiety   . Barrett esophagus   . Basal cell carcinoma   . Diplopia    history of,hospital 2/26-2/27/10  . Diverticulitis    colonoscopy 1995.  CT 01/31/09, 02/02/09  . Hiatal hernia   . Hiatal hernia    EGD 1998, 2007 ( Dr. Sharlett Iles)  . Hyperlipemia   . Hypertension   . Hypothyroidism   . Osteoporosis DXA 2015  . Personal history of colonic polyps    colonoscpy, removed 1995  . Thyroid carcinoma ([place]) 01/1994  . UTI (urinary tract infection)    Chronic     Past Surgical History:  Procedure Laterality Date  . ABDOMINAL HYSTERECTOMY    . CHOLECYSTECTOMY    . HIP SURGERY  08/1993   due to fracture of right hip  . SKIN CANCER EXCISION     basal cell left hand, forehead  . THYROIDECTOMY  04/1994   thyroid cancer   Family History  Problem Relation Age of Onset  . Cancer Brother        Pharyngeal   .  Heart disease Brother        MI  . Diabetes Brother   . Diabetes Sister        amputation  . Hypertension Mother   . Stroke Father   . Kidney cancer Neg Hx   . Bladder Cancer Neg Hx    Social History   Tobacco Use  . Smoking status: Never Smoker  . Smokeless tobacco: Never Used  Substance Use Topics  . Alcohol use: No    Alcohol/week: 0.0 standard drinks  . Drug use: No   Current Outpatient Medications  Medication Sig Dispense Refill  . Artificial Saliva (BIOTENE DRY MOUTH MOISTURIZING) SOLN Use as directed in the mouth or throat.    . B Complex Vitamins (B COMPLEX-B12 PO) Take by  mouth. Taken daily    . clidinium-chlordiazePOXIDE ([name]) 5-2.5 MG capsule TAKE 1 CAPSULE BY MOUTH EVERY DAY. 90 capsule 1  . conjugated estrogens (PREMARIN) vaginal cream Place 1 Applicatorful vaginally daily. Apply 0.5mg  (pea-sized amount)  just inside the vaginal introitus with a finger-tip every night for two weeks and then Monday, Wednesday and Friday nights. 30 g 12  . ENSURE PLUS (ENSURE PLUS) LIQD Take 237 mLs by mouth.    Marland Kitchen HYDROcodone-acetaminophen (NORCO/VICODIN) 5-325 MG tablet Take 1 tablet by mouth 2 (two) times daily. 180 tablet 0  . lisinopril (PRINIVIL,ZESTRIL) 10 MG tablet TAKE 1 TABLET BY MOUTH DAILY AT BEDTIME 90 tablet 1  . loratadine ([employer] ALLERGY RELIEF) 10 MG dissolvable tablet Take 10 mg by mouth daily as needed for allergies.     Marland Kitchen LORazepam (ATIVAN) 0.5 MG tablet TAKE 1/2 TO 1 TABLET BY MOUTH TWICE DAILY AS NEEDED FOR ANXIETY 40 tablet 2  . mirtazapine (REMERON) 15 MG tablet TAKE 1 TABLET BY MOUTH AT BEDTIME    . omeprazole (PRILOSEC) 40 MG capsule TAKE 1 CAPSULE BY MOUTH EVERY MORNING 45MINUTES BEFORE BREAKFAST 90 capsule 1  . polyethylene glycol powder (MIRALAX) powder Take 17 g by mouth every evening.     Marland Kitchen SYNTHROID 88 MCG tablet TAKE 1 TABLET BY MOUTH ONCE A DAY 90 tablet 1  . Cyanocobalamin (VITAMIN B-12 PO) Take by mouth.     Current Facility-Administered Medications  Medication Dose Route Frequency Provider Last Rate Last Dose  . 0.9 %  sodium chloride infusion  500 mL Intravenous Continuous Akeya Ryther, Carlota Raspberry, MD       Allergies  Allergen Reactions  . Bentyl [Dicyclomine Hcl] Other (See Comments)    abd pain  . Ciprofloxacin     Can cause GI upset, esp if taken with metamucil, o/w it is tolerated  . Cortisone Acetate     REACTION: nausea  . Doxycycline Other (See Comments)    GI upset  . Gabapentin     REACTION: severe dizziness  . Metoclopramide Hcl   . Penicillins     Has patient had a PCN reaction causing immediate rash, facial/tongue/throat  swelling, SOB or lightheadedness with hypotension: yes Has patient had a PCN reaction causing severe rash involving mucus membranes or skin necrosis: yes Has patient had a PCN reaction that required hospitalization no Has patient had a PCN reaction occurring within the last 10 years: no If all of the above answers are "NO", then may proceed with Cephalosporin use.   . Promethazine Hcl     REACTION: unspecified  . Remeron [Mirtazapine] Other (See Comments)    Tolerates 15 mg/day but does not tolerate 30 mg.  Felt jittery with 30 mg dose.  Marland Kitchen  Septra [Sulfamethoxazole-Trimethoprim] Other (See Comments)    GI upset but not allergy     Review of Systems: All systems reviewed and negative except where noted in HPI.   Lab Results  Component Value Date   WBC 6.9 10/15/2018   HGB 13.1 10/15/2018   HCT 38.7 10/15/2018   MCV 90.5 10/15/2018   PLT 217.0 10/15/2018    Lab Results  Component Value Date   CREATININE 0.85 10/15/2018   BUN 16 10/15/2018   NA 134 (L) 10/15/2018   K 4.3 10/15/2018   CL 96 10/15/2018   CO2 30 10/15/2018   Lab Results  Component Value Date   ALT 11 10/15/2018   AST 23 10/15/2018   ALKPHOS 68 10/15/2018   BILITOT 0.5 10/15/2018     Physical Exam: BP (!) 150/84 (BP Location: Left Arm, Patient Position: Sitting, Cuff Size: Normal)   Pulse 88   Ht 5' 1.5" (1.562 m)   Wt 101 lb 2 oz (45.9 kg)   LMP 03/28/1989   BMI 18.80 kg/m  Constitutional: Pleasant, female in no acute distress. HEENT: Normocephalic and atraumatic. Conjunctivae are normal. No scleral icterus. Neck supple.  Cardiovascular: Normal rate, regular rhythm.  Pulmonary/chest: Effort normal and breath sounds normal. No wheezing, rales or rhonchi. Abdominal: Soft, nondistended, fairly tender in LLQ, There are no masses palpable.  Extremities: no edema Lymphadenopathy: No cervical adenopathy noted. Neurological: Alert and oriented to person place and time. Skin: Skin is warm and dry. No  rashes noted. Psychiatric: Normal mood and affect. Behavior is normal.   ASSESSMENT AND PLAN: 85 year old female here for reassessment the following issues:  Weight loss / left lower quadrant pain - ongoing progressive weight loss, around 30 pounds over 3 years, about 8 pounds over the past year or so. Unclear what is driving this process. In 2018 she had no evidence of concerning findings on the CT of the chest abdomen pelvis when done for this reason. Her upper endoscopy did not show any problems with her stomach. She does have some decreased appetite which is affecting her intake. Despite high-dose Remeron she still only eats about 2 meals per day. I counseled her to try to eat at least 3 meals a day along with supplemental protein shakes or Ensure as well to gain weight. She otherwise endorses some intermittent left lower quadrant pain, has a hard time telling me how often this is bothering her however on exam today she is quite tender in the left lower quadrant. I suspect her symptoms are due to a severe diverticular stricture noted on prior colonoscopy. Prior CT showed no evidence of mass lesions or concerning changes otherwise. Her last colonoscopy was in 2012, it was very difficult exam and stricture was not easily traversable. I think she is higher than average risk for colonoscopy given her age and severe diverticular stricture. Given her ongoing weight loss in her left lower quadrant pain, recommend a CT scan to check for interval changes, ensure no chronic smoldering diverticulitis. Recommend she stay on MiraLAX at least once a day to keep stools loose and prevent constipation which makes symptoms much worse from the stricture. We'll await that result. She agreed with the plan  Dysphagia - resolved post-dilation of cricopharyngeal stricture, she can follow-up as needed if this recurs.[place] Cellar, MD [facility]  CC: [name], MD

## 2018-12-16 ENCOUNTER — Ambulatory Visit
Admission: RE | Admit: 2018-12-16 | Discharge: 2018-12-16 | Disposition: A | Discharge status: Home / Self Care | Payer: Medicare Other | Source: Ambulatory Visit | Attending: Gastroenterology | Admitting: Gastroenterology

## 2018-12-16 ENCOUNTER — Other Ambulatory Visit: Payer: Medicare Other

## 2018-12-16 DIAGNOSIS — R1032 Left lower quadrant pain: Secondary | ICD-10-CM

## 2018-12-16 DIAGNOSIS — K5730 Diverticulosis of large intestine without perforation or abscess without bleeding: Secondary | ICD-10-CM | POA: Diagnosis not present

## 2018-12-16 LAB — POCT I-STAT CREATININE: Creatinine, Ser: 0.8 mg/dL (ref 0.44–1.00)

## 2018-12-16 MED ORDER — IOHEXOL 300 MG/ML  SOLN
75.0000 mL | Freq: Once | INTRAMUSCULAR | Status: AC | PRN
  Administered 2018-12-16: 75 mL via INTRAVENOUS

## 2018-12-18 ENCOUNTER — Other Ambulatory Visit: Payer: Self-pay | Admitting: Family Medicine

## 2018-12-18 NOTE — Telephone Encounter (Signed)
Sent. Thanks.   

## 2018-12-18 NOTE — Telephone Encounter (Signed)
Electronic refill request [name] Last refill 09/05/18 #90/1 Last  office visit 11/04/18

## 2018-12-24 DIAGNOSIS — D0462 Carcinoma in situ of skin of left upper limb, including shoulder: Secondary | ICD-10-CM | POA: Diagnosis not present

## 2019-01-01 ENCOUNTER — Other Ambulatory Visit: Payer: Self-pay | Admitting: Family Medicine

## 2019-01-02 NOTE — Telephone Encounter (Signed)
Electronic refill request. Lorazepam Last office visit:   11/04/18 Last Filled:    40 tablet 2 09/10/2018  Please advise.

## 2019-01-03 NOTE — Telephone Encounter (Signed)
Sent. Thanks.   

## 2019-01-21 ENCOUNTER — Other Ambulatory Visit (INDEPENDENT_AMBULATORY_CARE_PROVIDER_SITE_OTHER): Payer: Medicare Other

## 2019-01-21 DIAGNOSIS — E559 Vitamin D deficiency, unspecified: Secondary | ICD-10-CM | POA: Diagnosis not present

## 2019-01-21 LAB — VITAMIN D 25 HYDROXY (VIT D DEFICIENCY, FRACTURES): VITD: 21.21 ng/mL — ABNORMAL LOW (ref 30.00–100.00)

## 2019-01-22 ENCOUNTER — Other Ambulatory Visit: Payer: Self-pay | Admitting: Family Medicine

## 2019-01-22 DIAGNOSIS — E559 Vitamin D deficiency, unspecified: Secondary | ICD-10-CM

## 2019-01-23 ENCOUNTER — Other Ambulatory Visit: Payer: Self-pay | Admitting: Family Medicine

## 2019-01-23 DIAGNOSIS — E559 Vitamin D deficiency, unspecified: Secondary | ICD-10-CM

## 2019-01-23 MED ORDER — VITAMIN D (ERGOCALCIFEROL) 1.25 MG (50000 UNIT) PO CAPS: 50000.0000 [IU] | ORAL_CAPSULE | ORAL | 0 refills | Status: DC

## 2019-02-10 ENCOUNTER — Other Ambulatory Visit: Payer: Self-pay | Admitting: Family Medicine

## 2019-02-10 DIAGNOSIS — G8929 Other chronic pain: Secondary | ICD-10-CM

## 2019-02-10 NOTE — Telephone Encounter (Signed)
Name of Medication: Hydrocodone Name of Pharmacy: [facility] or Written Date and Quantity:  180 tablet 0 10/15/2018  Last Office Visit and Type: 11/04/18 Acute Next Office Visit and Type: None Last Controlled Substance Agreement Date: 08/07/2014 Last UDS: 10/15/2018

## 2019-02-11 NOTE — Telephone Encounter (Signed)
Sent. Thanks.   Indication for chronic opioid:chronicL sciaticaand back pain Medication and dose:hydrocodone 5/325mg  # pills per month:60/month, 180 per 3 month Last UDS date:10/15/18 Pain contract signed (Y/N):yes Date narcotic database last reviewed (include red flags):02/11/2019

## 2019-02-20 ENCOUNTER — Ambulatory Visit (INDEPENDENT_AMBULATORY_CARE_PROVIDER_SITE_OTHER): Payer: Medicare Other | Admitting: Family Medicine

## 2019-02-20 ENCOUNTER — Other Ambulatory Visit: Payer: Self-pay

## 2019-02-20 ENCOUNTER — Other Ambulatory Visit: Payer: Self-pay | Admitting: Family Medicine

## 2019-02-20 DIAGNOSIS — J069 Acute upper respiratory infection, unspecified: Secondary | ICD-10-CM

## 2019-02-20 MED ORDER — AZITHROMYCIN 250 MG PO TABS: ORAL_TABLET | ORAL | 0 refills | Status: DC

## 2019-02-20 MED ORDER — POLYETHYLENE GLYCOL 3350 17 GM/SCOOP PO POWD: 17.0000 g | Freq: Every day | ORAL | Status: DC | PRN

## 2019-02-20 NOTE — Progress Notes (Signed)
Virtual Visit via Telephone Note  I connected with patient on 02/20/19 at 12:17 PM by telephone and verified that I am speaking with the correct person using two identifiers.  Location of patient: home  Location of MD: [place] Name of referring provider (if blank then none associated): Names per persons and role in encounter:  MD: Earlyne Iba, Patient: name listed above.    I discussed the limitations, risks, security and privacy concerns of performing an evaluation and management service by telephone and the availability of in person appointments. I also discussed with the patient that there may be a patient responsible charge related to this service. The patient expressed understanding and agreed to proceed.   History of Present Illness: sx started about 1 week ago.  Rhinorrhea initially. Then restarted claritin/otc allergy meds but ear congestion persisted.  Sx worse at night.  No fevers.  No vomiting.  No diarrhea now but some earlier in the week.  Left ear pain.  No rash.  Not SOB.  No abd pain.  Still taking PO solids and liquids.  No sputum.  No ST.  No cough.  Still with some rhinorrhea.     Observations/Objective: NAD, speaking in complete sentences but sounds congested.    Assessment and Plan: URI sx.  Present 1 week, not improved with OTC allergy meds.  Okay to continue OTC antihistamine but start zmax for presumed sinusitis.  D/w pt.  Nontoxic.  Not SOB, no cough.  No fevers.  Routine cautions d/w pt.   Follow Up Instructions: she'll call back as needed.  She agrees with plan.   I discussed the assessment and treatment plan with the patient. The patient was provided an opportunity to ask questions and all were answered. The patient agreed with the plan and demonstrated an understanding of the instructions.   The patient was advised to call back or seek an in-person evaluation if the symptoms worsen or if the condition fails to improve as anticipated.  I provided 10  minutes of non-face-to-face time during this encounter.   Elsie Stain, MD

## 2019-02-20 NOTE — Assessment & Plan Note (Signed)
Assessment and Plan: URI sx.  Present 1 week, not improved with OTC allergy meds.  Okay to continue OTC antihistamine but start zmax for presumed sinusitis.  D/w pt.  Nontoxic.  Not SOB, no cough.  No fevers.  Routine cautions d/w pt.   Follow Up Instructions: she'll call back as needed.  She agrees with plan.

## 2019-02-20 NOTE — Telephone Encounter (Signed)
This was prev sent for 90 days with a refill.  Why does she need a refill at this point?

## 2019-02-20 NOTE — Telephone Encounter (Signed)
Thanks for addressing zmax issue.  Did she also need refill on librax?  Was prev sent in 11/2018.  If so, then let me know.   Thanks.

## 2019-02-20 NOTE — Telephone Encounter (Signed)
Todd @ [facility] called Needing dx code on all zpaks They are waiting for this  So they can deliver  [phone]

## 2019-02-20 NOTE — Telephone Encounter (Signed)
Electronic refill request. [name] Last office visit:   02/20/2019 Last Filled:    90 capsule 1 12/18/2018  Please advise.

## 2019-03-06 ENCOUNTER — Telehealth: Payer: Self-pay

## 2019-03-06 ENCOUNTER — Other Ambulatory Visit: Payer: Self-pay

## 2019-03-06 ENCOUNTER — Ambulatory Visit (INDEPENDENT_AMBULATORY_CARE_PROVIDER_SITE_OTHER): Payer: Medicare Other | Admitting: Family Medicine

## 2019-03-06 ENCOUNTER — Encounter: Payer: Self-pay | Admitting: Family Medicine

## 2019-03-06 DIAGNOSIS — K5641 Fecal impaction: Secondary | ICD-10-CM

## 2019-03-06 MED ORDER — LIDOCAINE VISCOUS HCL 2 % MT SOLN: OROMUCOSAL | 0 refills | Status: DC

## 2019-03-06 MED ORDER — POLYETHYLENE GLYCOL 3350 17 GM/SCOOP PO POWD: 17.0000 g | Freq: Two times a day (BID) | ORAL | Status: DC | PRN

## 2019-03-06 NOTE — Telephone Encounter (Signed)
Have her go by the pharmacy and pick up rx for lidocaine viscous.  rx sent.  Have her bring that to OV today here.   Please add her to schedule.  We can use that med for discomfort and attempt disimpaction here.   Thanks.

## 2019-03-06 NOTE — Telephone Encounter (Signed)
Tried to call pt. Line was busy. 

## 2019-03-06 NOTE — Assessment & Plan Note (Signed)
She should be able to pass stool after manual disimpaction.  Keep taking miralax twice a day and drink plenty of fluid in the meantime.  Use lidocaine externally if needed for pain.  Update [name] as needed.   When better, cut back on the miralax to once daily.  She agrees.

## 2019-03-06 NOTE — Telephone Encounter (Signed)
Pt notified as instructed and pt voiced understanding and very appreciative. I spoke with Harmon Pier at [facility] and lidocaine viscous will be ready for pick up in 15'. Pt will pick up rx and come to [facility] to see Dr [name] 03/06/19 at 12 noon. FYI to Dr [name].

## 2019-03-06 NOTE — Patient Instructions (Addendum)
Keep taking miralax twice a day and drink plenty of fluid in the meantime.  Use lidocaine externally on your bottom if needed for pain.  Update [name] as needed.  Take care.  Glad to see you.   When you are better, cut back on the miralax to once daily.

## 2019-03-06 NOTE — Telephone Encounter (Signed)
Thanks

## 2019-03-06 NOTE — Progress Notes (Signed)
She temporarily stopped miralax.  Then had constipation.  Tried to restart miralax, then MOM, then used a suppository.  1 week w/o BM.  She is passing some liquid stool.  Now with hemorrhoid irritation from straining.  She doesn't have h/o constipation typically, at baseline, in spite of pain med use.    No other change in meds.   D/w pt about bowel regimen given her pain medicine use, ie continued pain med use.    No fevers.  Prev rectal pain is some better.    Meds, vitals, and allergies reviewed.   ROS: Per HPI unless specifically indicated in ROS section   nad Elderly female ncat rrr ctab abd soft, minimally ttp w/o rebound in the lower abd but not distended.  Upper abd not ttp Chaperoned exam.  External hemorrhoids, not bleeding or thrombosed.   Soft stool in the vault, a small amount removed with manual disimpaction.  Remainder of stool in vault broken up but not able to be comfortably removed.   Tolerated by patient without complication.

## 2019-03-06 NOTE — Telephone Encounter (Signed)
Pt said for 1 wk having constipation; pt had been using miralax but pt stopped that and started MOM 2 Tablespoonful and then repeated next day; no results; pt has used fleet suppository but no results. Pt said hard feeling at rectum; pt cannot push stool out and now has hemorrhoids due to pushing; some liquid stool does come out. Pt has no one to assist her with fleets enema at home. Due to positioning pt cannot use fleets enema herself. Pt wants to know what else can be done. to go anywhere pt would have to drive herself.Last normal BM was 4 - 5 days ago; No fever,no cough, no SOB, no traveling and no exposure to covid or flu. [facility]

## 2019-04-29 ENCOUNTER — Other Ambulatory Visit (INDEPENDENT_AMBULATORY_CARE_PROVIDER_SITE_OTHER): Payer: Medicare Other

## 2019-04-29 DIAGNOSIS — E559 Vitamin D deficiency, unspecified: Secondary | ICD-10-CM | POA: Diagnosis not present

## 2019-04-29 LAB — VITAMIN D 25 HYDROXY (VIT D DEFICIENCY, FRACTURES): VITD: 21.78 ng/mL — ABNORMAL LOW (ref 30.00–100.00)

## 2019-05-01 ENCOUNTER — Ambulatory Visit: Payer: Medicare Other | Admitting: Family Medicine

## 2019-05-01 DIAGNOSIS — M810 Age-related osteoporosis without current pathological fracture: Secondary | ICD-10-CM

## 2019-05-01 NOTE — Progress Notes (Signed)
Called and LMOVM x2.  No answer.  I was running behind and I left message apologizing for that.

## 2019-05-04 NOTE — Assessment & Plan Note (Signed)
Called and LMOVM x2.  No answer.  I was running behind and I left message apologizing for that.

## 2019-05-05 ENCOUNTER — Other Ambulatory Visit: Payer: Self-pay | Admitting: Family Medicine

## 2019-05-05 NOTE — Telephone Encounter (Signed)
Electronic refill request. Lorazepam Last office visit:   05/01/2019 Last Filled:    40 tablet 2 01/03/2019    Electronic refill request. Hydrocodone Last office visit:   05/01/2019 Last Filled:    180 tablet 0 02/11/2019   Please advise.

## 2019-05-06 MED ORDER — CILIDINIUM-CHLORDIAZEPOXIDE 2.5-5 MG PO CAPS: 1.0000 | ORAL_CAPSULE | Freq: Every day | ORAL | 1 refills | Status: DC

## 2019-05-06 NOTE — Telephone Encounter (Signed)
Pt scheduled for 05/08/19 @ 4pm

## 2019-05-06 NOTE — Telephone Encounter (Signed)
Thanks

## 2019-05-06 NOTE — Telephone Encounter (Signed)
Sent. Thanks.  Prescription for [name] also sent to [facility] per patient request. Her vitamin D was still a little low. I would like her to get her office visit rescheduled so we can go over her med/situation.  30-minute phone visit would be preferred.  Thanks.

## 2019-05-08 ENCOUNTER — Ambulatory Visit (INDEPENDENT_AMBULATORY_CARE_PROVIDER_SITE_OTHER): Payer: Medicare Other | Admitting: Family Medicine

## 2019-05-08 DIAGNOSIS — R634 Abnormal weight loss: Secondary | ICD-10-CM

## 2019-05-08 DIAGNOSIS — E559 Vitamin D deficiency, unspecified: Secondary | ICD-10-CM | POA: Diagnosis not present

## 2019-05-08 DIAGNOSIS — G8929 Other chronic pain: Secondary | ICD-10-CM

## 2019-05-08 DIAGNOSIS — K589 Irritable bowel syndrome without diarrhea: Secondary | ICD-10-CM

## 2019-05-08 NOTE — Progress Notes (Addendum)
Interactive audio and video telecommunications were attempted between this provider and patient, however failed, due to patient having technical difficulties OR patient did not have access to video capability.  We continued and completed visit with audio only.   Virtual Visit via Telephone Note  I connected with patient on 05/08/19  at 4:26 PM  by telephone and verified that I am speaking with the correct person using two identifiers.  Location of patient: home   Location of MD: [place] Name of referring provider (if blank then none associated): Names per persons and role in encounter:  MD: Earlyne Iba, Patient: name listed above.    I discussed the limitations, risks, security and privacy concerns of performing an evaluation and management service by telephone and the availability of in person appointments. I also discussed with the patient that there may be a patient responsible charge related to this service. The patient expressed understanding and agreed to proceed.  CC: f/u.   History of Present Illness:   Low vit D. She didn't take the 50,000 unit rx due to GI upset on that medicine.  She is on 1000 unit, d/w pt about inc to 2000 units per day.    She is checking on pricing on librax, to see if she can get some help from the pharamcy.  She failed taper off med with more abd pain of med, better on restart.    Indication for chronic opioid:chronicL sciaticaand back pain Medication and dose:hydrocodone 5/325mg  # pills per month:60/month, 180 per 3 month Last UDS date:10/15/18 Pain contract signed (Y/N):yes Date narcotic database last reviewed (include red flags):05/08/19  Walking helps with back pain.  No ADE on med.    Prev weight loss.  Has seen GI.  Prev CT d/w pt, see below. She is up 2 lbs recently, "I'm eating better."  She has felt well enough to get out in the yard and work in her flowers/roses.  IMPRESSION: 1. No acute findings in the abdomen or pelvis.  Specifically, no findings to explain the patient's history of abdominal pain. 2. Mild intra and extrahepatic biliary duct dilatation, stable since prior study. 3. Prominent stool volume throughout the colon. Imaging features would be compatible with clinical constipation. 4. Left colonic diverticulosis without diverticulitis. 5.  Aortic Atherosclerois (ICD10-170.0)   Observations/Objective: nad  Speech wnl  Assessment and Plan:  Low vit D. She didn't take the 50,000 unit rx due to GI upset on that medicine.  She is on 1000 unit, d/w pt about inc to 2000 units per day.  We can recheck in 3 months.    She is checking on pricing on librax, to see if she can get some help from the pharamcy.  She failed taper off med with more abd pain of med, better on restart.    Indication for chronic opioid:chronicL sciaticaand back pain Medication and dose:hydrocodone 5/325mg  # pills per month:60/month, 180 per 3 month Last UDS date:10/15/18 Pain contract signed (Y/N):yes Date narcotic database last reviewed (include red flags):05/08/19  Walking helps with back pain.  No ADE on med.   Recheck in 3 months re: pain/med use.  Would continue as is for now.  H/o weight loss.  Has prev seen GI.  She has some recent weight gain per her report with inc PO intake.  She will continue to monitor her weight and update me in the meantime as needed.  Follow Up Instructions: see above.    I discussed the assessment and treatment  plan with the patient. The patient was provided an opportunity to ask questions and all were answered. The patient agreed with the plan and demonstrated an understanding of the instructions.   The patient was advised to call back or seek an in-person evaluation if the symptoms worsen or if the condition fails to improve as anticipated.  I provided 26 minutes of non-face-to-face time during this encounter.  Elsie Stain, MD

## 2019-05-11 DIAGNOSIS — E559 Vitamin D deficiency, unspecified: Secondary | ICD-10-CM | POA: Insufficient documentation

## 2019-05-11 NOTE — Assessment & Plan Note (Signed)
Indication for chronic opioid:chronicL sciaticaand back pain Medication and dose:hydrocodone 5/325mg  # pills per month:60/month, 180 per 3 month Last UDS date:10/15/18 Pain contract signed (Y/N):yes Date narcotic database last reviewed (include red flags):05/08/19  Walking helps with back pain.  No ADE on med.   Recheck in 3 months re: pain/med use.  Would continue as is for now.

## 2019-05-11 NOTE — Assessment & Plan Note (Signed)
Low vit D. She didn't take the 50,000 unit rx due to GI upset on that medicine.  She is on 1000 unit, d/w pt about inc to 2000 units per day.  We can recheck in 3 months.

## 2019-05-11 NOTE — Assessment & Plan Note (Addendum)
H/o weight loss.  Has prev seen GI.  She has some recent weight gain per her report with inc PO intake.  She will continue to monitor her weight and update me in the meantime as needed.

## 2019-05-11 NOTE — Assessment & Plan Note (Signed)
She is checking on pricing on librax, to see if she can get some help from the pharamcy.  She failed taper off med with more abd pain of med, better on restart.

## 2019-05-13 ENCOUNTER — Telehealth: Payer: Self-pay | Admitting: Family Medicine

## 2019-05-13 NOTE — Telephone Encounter (Signed)
Called left patient voicemail to call us back to schedule appt.      Message Received: 2 days ago Message Contents  [name], MD  [name], [name] in 3 months re: pain and vit D. Need to set up OV with labs ahead of time. thanks

## 2019-05-15 NOTE — Telephone Encounter (Signed)
Patient stated that she spoke with Dr [name] about 3 weeks ago about her librax  being so expensive.  She stated that he was going to look and see what he could do but the patient stated she hasn't heard anything and wasn't sure if he was still working on this

## 2019-05-16 NOTE — Telephone Encounter (Signed)
I had been checking on this but hadn't found any cheaper options.  I'll ask for help from [place] on this.  Thanks.

## 2019-05-16 NOTE — Telephone Encounter (Signed)
Spoke to pt. I looked on GoodRx and [name] has it for $55. [facility] said [facility].

## 2019-05-23 ENCOUNTER — Telehealth: Payer: Self-pay

## 2019-05-23 NOTE — Telephone Encounter (Signed)
Called twice and number keeps been busy

## 2019-05-23 NOTE — Telephone Encounter (Signed)
Pt left v/m returning a call from 05/22/19. Pt thinks call may be about getting one of her meds changed to a more affordable med. Pt request cb.

## 2019-05-26 NOTE — Telephone Encounter (Signed)
Patient has 3 mos OV with labs scheduled.

## 2019-05-30 NOTE — Telephone Encounter (Signed)
Patient is calling regarding advise for her medication she is going to run out of Medication. And would like to know an affordable option for her medication. Please advise.

## 2019-06-02 NOTE — Telephone Encounter (Signed)
See refill request where this was discussed with Dr. [name] on 05/05/19 where pt was going to use the Good Rx card. Called pt and asked her about that phone call. Pt said that the good Rx card helped some but not much and now they are not providing any additional help with cost of medication so she is back to square one.   Pt did pick up a refill last week so she has a months worth of medication but she said she can't afford to get any more of the librax and wants Dr. [name] to decide what to alt med to give her.  I advised pt Dr. [name] is out of the office this week but returns next week. Pt has plenty of meds until he returns so she would like him to f/u with her when he gets back to let her know what to do because she can't afford to pay for medication again

## 2019-06-02 NOTE — Telephone Encounter (Signed)
What does she take it for?  What alternative does the pharmacy recommend that is cheaper?

## 2019-06-02 NOTE — Telephone Encounter (Addendum)
Pt states that she cannot afford [name], very expensive, requesting an alternative that might be more affordable. Pt states that even at [facility] its expensive.  Aware that Dr [name] is not in office this week. Aware that I will send this Rx to another Provider to advise on.   Please advise Dr Glori Bickers. Thanks.   [place], [place]

## 2019-06-18 MED ORDER — CHLORDIAZEPOXIDE HCL 5 MG PO CAPS: 5.0000 mg | ORAL_CAPSULE | Freq: Every day | ORAL | 1 refills | Status: DC | PRN

## 2019-06-18 NOTE — Addendum Note (Signed)
Addended by: [name] on: 06/18/2019 11:41 PM   Modules accepted: Orders

## 2019-06-18 NOTE — Telephone Encounter (Signed)
I had one other idea.  Her previous prescription is for [name] which is a combination of clidinium-chlordiazePOXIDE.  She may be able to get plain chlordiazePOXIDE at the pharmacy.  I sent that in in the meantime.  Have her price check that.  Do not take it with the [name].  If this is a lot cheaper and if she is able to get it then she could use 1 pill a day instead of the [name] and then let me know how she is doing.  Sedation caution on medication, same of [name].  Thanks.

## 2019-06-19 NOTE — Telephone Encounter (Signed)
Patient advised.

## 2019-06-26 ENCOUNTER — Other Ambulatory Visit: Payer: Self-pay | Admitting: Family Medicine

## 2019-06-26 NOTE — Telephone Encounter (Signed)
Spoke with patient. She is not taking [name] right now due to the cost, she started on Librium (took it for 3 days so far). She will let us know how she is feeling with this change. [name] too expensive.

## 2019-07-03 ENCOUNTER — Other Ambulatory Visit: Payer: Self-pay | Admitting: Family Medicine

## 2019-07-31 ENCOUNTER — Other Ambulatory Visit (INDEPENDENT_AMBULATORY_CARE_PROVIDER_SITE_OTHER): Payer: Medicare Other

## 2019-07-31 ENCOUNTER — Other Ambulatory Visit: Payer: Self-pay

## 2019-07-31 ENCOUNTER — Other Ambulatory Visit: Payer: Self-pay | Admitting: *Deleted

## 2019-07-31 DIAGNOSIS — E559 Vitamin D deficiency, unspecified: Secondary | ICD-10-CM

## 2019-07-31 LAB — VITAMIN D 25 HYDROXY (VIT D DEFICIENCY, FRACTURES): VITD: 21.42 ng/mL — ABNORMAL LOW (ref 30.00–100.00)

## 2019-07-31 NOTE — Telephone Encounter (Signed)
Patient came in for lab work and asked [facility].   [name] Last office visit:   05/08/2019 Last Filled:    90 capsule 1 05/06/2019  Please advise.

## 2019-08-01 MED ORDER — CILIDINIUM-CHLORDIAZEPOXIDE 2.5-5 MG PO CAPS: 1.0000 | ORAL_CAPSULE | Freq: Every day | ORAL | 1 refills | Status: DC

## 2019-08-01 NOTE — Telephone Encounter (Signed)
I did not send the prescription for [name] yet.    How did patient do with plain chlordiazepoxide instead of [name]?  I thought she was going to try plain chlordiazepoxide as it may be a lot cheaper.  Please let me know how it went with the substitution.  Thanks.

## 2019-08-01 NOTE — Telephone Encounter (Signed)
Patient said it didn't work.

## 2019-08-01 NOTE — Telephone Encounter (Signed)
Noted.  Sent.  Thanks. 

## 2019-08-08 ENCOUNTER — Other Ambulatory Visit: Payer: Self-pay

## 2019-08-08 ENCOUNTER — Ambulatory Visit (INDEPENDENT_AMBULATORY_CARE_PROVIDER_SITE_OTHER): Payer: Medicare Other | Admitting: Family Medicine

## 2019-08-08 ENCOUNTER — Encounter: Payer: Self-pay | Admitting: Family Medicine

## 2019-08-08 VITALS — BP 122/74 | HR 85 | Temp 97.8°F | Ht 61.5 in | Wt 98.5 lb

## 2019-08-08 DIAGNOSIS — R634 Abnormal weight loss: Secondary | ICD-10-CM | POA: Diagnosis not present

## 2019-08-08 DIAGNOSIS — E559 Vitamin D deficiency, unspecified: Secondary | ICD-10-CM

## 2019-08-08 DIAGNOSIS — K589 Irritable bowel syndrome without diarrhea: Secondary | ICD-10-CM | POA: Diagnosis not present

## 2019-08-08 DIAGNOSIS — G8929 Other chronic pain: Secondary | ICD-10-CM

## 2019-08-08 MED ORDER — HYDROCODONE-ACETAMINOPHEN 5-325 MG PO TABS: 1.0000 | ORAL_TABLET | Freq: Two times a day (BID) | ORAL | 0 refills | Status: DC

## 2019-08-08 NOTE — Patient Instructions (Addendum)
Try to increase the vitamin D to 2000 units a day.  Don't change your meds for now.  I sent your pain meds to the pharmacy.  Take care.  Glad to see you.

## 2019-08-08 NOTE — Progress Notes (Signed)
She had return of stomach pain off librax.  She restarted it and that helped.  It is still expensive for patient.   I have not been able to find cheaper options or medication assistance for the patient.  Discussed.  H/o weight loss.  Has prev seen GI.  she had dec in weight with time off librax.  She added in ensure in the meantime.  She is hopeful that her weight will pick back up with restarting [name].  Low vit D, restarted about 2-3 months ago, 1000 IU a day.  D/w pt about trying inc to 2000 units a day.   Indication for chronic opioid:chronicL sciaticaand back pain Medication and dose:hydrocodone 5/325mg  # pills per month:60/month, 180 per 3 month Last UDS date:10/15/18 Pain contract signed (Y/N):yes Date narcotic database last reviewed (include red flags):08/08/19   Chronic back pain. H/o hip fracture. Takes current meds w/o ADE. Doing well, functional with current meds.  "If it wasn't for the medicine (hydrocodone), I wouldn't be able to get around and do anything."  Pain inventory (1-10) Average pain: variable, worse with activity.   Pain now: "a little, in the lower part of my back."  My pain is constant Pain is worse with activiyty Relief from meds:yes  In the last 24 hours, how much has pain interfered with the following (1-10 greatest interference)? General activity- some limitation, "I can't overdo it."  Relationships with others- more limitation from covid Enjoyment of life- more limitation from covid What time of the day is the pain the worst- after yardwork Sleep is described by patient as "good."   Mobility/function Assistance device:no How many minutes can you walk: 30 minutes, "in the yard" Able to climb steps:yes Driving:yes Disabled: no  Bowel or bladder symptoms:  no Mood: "I''m okay" with pandemic considerations d/w pt.   Physicians involved in care: no changes Any changes since last visit? No changes.    She'll get a flu shot later this fall.     Meds, vitals, and allergies reviewed.   ROS: Per HPI unless specifically indicated in ROS section   GEN: nad, alert and oriented HEENT: ncat NECK: supple w/o LA CV: rrr PULM: ctab, no inc wob ABD: soft, +bs EXT: no edema SKIN: no acute rash

## 2019-08-10 NOTE — Assessment & Plan Note (Signed)
H/o weight loss.  Has prev seen GI.  she had dec in weight with time off librax.  She added in ensure in the meantime.  She is hopeful that her weight will pick back up with restarting [name].  We can monitor at clinic and she can update me if she has any further weight loss in the meantime.

## 2019-08-10 NOTE — Assessment & Plan Note (Signed)
She had return of stomach pain off librax.  She restarted it and that helped.  It is still expensive for patient.   I have not been able to find cheaper options or medication assistance for the patient.  Discussed.

## 2019-08-10 NOTE — Assessment & Plan Note (Addendum)
Indication for chronic opioid:chronicL sciaticaand back pain Medication and dose:hydrocodone 5/325mg  # pills per month:60/month, 180 per 3 month Last UDS date:10/15/18 Pain contract signed (Y/N):yes Date narcotic database last reviewed (include red flags):08/08/19   Chronic back pain. H/o hip fracture. Takes current meds w/o ADE. Doing well, functional with current meds.  "If it wasn't for the medicine (hydrocodone), I wouldn't be able to get around and do anything."  Given her situation, her pain level, and her response to medication, I think it makes sense to continue as is for now.  She does have some benefit from the pain medication and I do not think she is going to be able to adequately control her pain off of her current medications.  She agrees with plan.  >25 minutes spent in face to face time with patient, >50% spent in counselling or coordination of care.

## 2019-08-10 NOTE — Assessment & Plan Note (Signed)
Low vit D, restarted about 2-3 months ago, 1000 IU a day.  D/w pt about trying inc to 2000 units a day.

## 2019-08-13 DIAGNOSIS — L720 Epidermal cyst: Secondary | ICD-10-CM | POA: Diagnosis not present

## 2019-08-13 DIAGNOSIS — D225 Melanocytic nevi of trunk: Secondary | ICD-10-CM | POA: Diagnosis not present

## 2019-08-13 DIAGNOSIS — Z85828 Personal history of other malignant neoplasm of skin: Secondary | ICD-10-CM | POA: Diagnosis not present

## 2019-08-13 DIAGNOSIS — Z08 Encounter for follow-up examination after completed treatment for malignant neoplasm: Secondary | ICD-10-CM | POA: Diagnosis not present

## 2019-09-26 ENCOUNTER — Other Ambulatory Visit: Payer: Self-pay | Admitting: Family Medicine

## 2019-09-29 NOTE — Telephone Encounter (Signed)
Electronic refill request. [name] Last office visit:   08/08/2019 Last Filled:

## 2019-10-01 ENCOUNTER — Other Ambulatory Visit: Payer: Self-pay | Admitting: Family Medicine

## 2019-10-01 ENCOUNTER — Telehealth: Payer: Self-pay | Admitting: *Deleted

## 2019-10-01 MED ORDER — HYOSCYAMINE SULFATE 0.125 MG PO TABS: 0.1250 mg | ORAL_TABLET | Freq: Three times a day (TID) | ORAL | 3 refills | Status: DC | PRN

## 2019-10-01 NOTE — Telephone Encounter (Signed)
If she can't get librax filled at [facility], then the only other option I can think of would be hyoscyamine prn.  I sent that in the meantime.   If that isn't helping, then I think it makes sense to have her see GI.  Thanks.

## 2019-10-01 NOTE — Telephone Encounter (Signed)
Spoke to [place] at [facility] and was advised that they are not able to get [name] from their preferred manufacturer because of it being on backorder.Gerald Stabs stated that he is able to get it from another manufacturer and the cost for #100 for $540. Gerald Stabs stated that they have on file that patient has been given the Librium 5 mg previously and the cost of that is #30/$15.

## 2019-10-01 NOTE — Telephone Encounter (Signed)
Please check with another local pharmacy like [place] to see if they have any supply of this medication in the meantime.  I will be checking on other options.  Thanks.

## 2019-10-01 NOTE — Telephone Encounter (Signed)
Sent. Thanks.   

## 2019-10-01 NOTE — Telephone Encounter (Signed)
Name of Medication: Lorazepam Name of Pharmacy: [facility] or Written Date and Quantity: 05/06/2019 #40 with 2 refills Last Office Visit and Type: 08/08/2019  Next Office Visit and Type: none Last Controlled Substance Agreement Date: 08/17/2017 Last UDS: 10/15/2018

## 2019-10-01 NOTE — Telephone Encounter (Signed)
Patient left a voicemail stating that she was told by the pharmacist that they are no longer able to get [name]. Patient stated that she only has 2 pills left. Patient stated that she has tried another medication for her stomach that did not work. Patient wants to know what she is suppose to do? Patient stated that at one time she was tried on a medication similar to [name], but one of the ingredient was taken out of the pill and that did not work.  Called and spoke to [place] [facility] and was advised that the manufacturer is no longer making this medication and it will be up to the doctor to make a substitute.

## 2019-10-02 NOTE — Telephone Encounter (Signed)
Left message on patient's voicemail to return call

## 2019-10-07 NOTE — Telephone Encounter (Signed)
Patient advised and said the pharmacist at [facility] is going to talk with her about some options.

## 2019-10-21 ENCOUNTER — Ambulatory Visit (INDEPENDENT_AMBULATORY_CARE_PROVIDER_SITE_OTHER): Payer: Medicare Other

## 2019-10-21 ENCOUNTER — Other Ambulatory Visit (INDEPENDENT_AMBULATORY_CARE_PROVIDER_SITE_OTHER): Payer: Medicare Other

## 2019-10-21 ENCOUNTER — Other Ambulatory Visit: Payer: Self-pay | Admitting: Family Medicine

## 2019-10-21 ENCOUNTER — Other Ambulatory Visit: Payer: Self-pay

## 2019-10-21 DIAGNOSIS — Z0000 Encounter for general adult medical examination without abnormal findings: Secondary | ICD-10-CM | POA: Diagnosis not present

## 2019-10-21 DIAGNOSIS — E559 Vitamin D deficiency, unspecified: Secondary | ICD-10-CM

## 2019-10-21 DIAGNOSIS — I10 Essential (primary) hypertension: Secondary | ICD-10-CM

## 2019-10-21 DIAGNOSIS — G8929 Other chronic pain: Secondary | ICD-10-CM

## 2019-10-21 LAB — COMPREHENSIVE METABOLIC PANEL
ALT: 9 U/L (ref 0–35)
AST: 21 U/L (ref 0–37)
Albumin: 3.7 g/dL (ref 3.5–5.2)
Alkaline Phosphatase: 66 U/L (ref 39–117)
BUN: 16 mg/dL (ref 6–23)
CO2: 31 mEq/L (ref 19–32)
Calcium: 8.8 mg/dL (ref 8.4–10.5)
Chloride: 99 mEq/L (ref 96–112)
Creatinine, Ser: 0.84 mg/dL (ref 0.40–1.20)
GFR: 64.1 mL/min (ref >60.00)
Glucose, Bld: 135 mg/dL — ABNORMAL HIGH (ref 70–99)
Potassium: 3.8 mEq/L (ref 3.5–5.1)
Sodium: 136 mEq/L (ref 135–145)
Total Bilirubin: 0.6 mg/dL (ref 0.2–1.2)
Total Protein: 7.2 g/dL (ref 6.0–8.3)

## 2019-10-21 LAB — VITAMIN D 25 HYDROXY (VIT D DEFICIENCY, FRACTURES): VITD: 24.8 ng/mL — ABNORMAL LOW (ref 30.00–100.00)

## 2019-10-21 LAB — LIPID PANEL
Cholesterol: 176 mg/dL (ref 0–200)
HDL: 64.4 mg/dL (ref >39.00)
LDL Cholesterol: 97 mg/dL (ref 0–99)
NonHDL: 111.92
Total CHOL/HDL Ratio: 3
Triglycerides: 74 mg/dL (ref 0.0–149.0)
VLDL: 14.8 mg/dL (ref 0.0–40.0)

## 2019-10-21 LAB — TSH: TSH: 1.03 u[IU]/mL (ref 0.35–4.50)

## 2019-10-21 NOTE — Patient Instructions (Signed)
[name] , Thank you for taking time to come for your Medicare Wellness Visit. I appreciate your ongoing commitment to your health goals. Please review the following plan we discussed and let me know if I can assist you in the future.   Screening recommendations/referrals: Colonoscopy: no longer required Mammogram: no longer required Bone Density: Up to date, completed 10/29/2014 Recommended yearly ophthalmology/optometry visit for glaucoma screening and checkup Recommended yearly dental visit for hygiene and checkup  Vaccinations: Influenza vaccine: will get at physical Pneumococcal vaccine: will get at physical Tdap vaccine: Up to date, completed 02/22/2012 Shingles vaccine: will check with insurance    Advanced directives: Advance directive discussed with you today. Even though you declined this today please call our office should you change your mind and we can give you the proper paperwork for you to fill out.  Conditions/risks identified: hypertension, hyperlipidemia  Next appointment: 10/28/2019 @ 12 pm    Preventive Care 65 Years and Older, Female Preventive care refers to lifestyle choices and visits with your health care provider that can promote health and wellness. What does preventive care include?  A yearly physical exam. This is also called an annual well check.  Dental exams once or twice a year.  Routine eye exams. Ask your health care provider how often you should have your eyes checked.  Personal lifestyle choices, including:  Daily care of your teeth and gums.  Regular physical activity.  Eating a healthy diet.  Avoiding tobacco and drug use.  Limiting alcohol use.  Practicing safe sex.  Taking low-dose aspirin every day.  Taking vitamin and mineral supplements as recommended by your health care provider. What happens during an annual well check? The services and screenings done by your health care provider during your annual well check will  depend on your age, overall health, lifestyle risk factors, and family history of disease. Counseling  Your health care provider may ask you questions about your:  Alcohol use.  Tobacco use.  Drug use.  Emotional well-being.  Home and relationship well-being.  Sexual activity.  Eating habits.  History of falls.  Memory and ability to understand (cognition).  Work and work Statistician.  Reproductive health. Screening  You may have the following tests or measurements:  Height, weight, and BMI.  Blood pressure.  Lipid and cholesterol levels. These may be checked every 5 years, or more frequently if you are over 64 years old.  Skin check.  Lung cancer screening. You may have this screening every year starting at age 36 if you have a 30-pack-year history of smoking and currently smoke or have quit within the past 15 years.  Fecal occult blood test (FOBT) of the stool. You may have this test every year starting at age 69.  Flexible sigmoidoscopy or colonoscopy. You may have a sigmoidoscopy every 5 years or a colonoscopy every 10 years starting at age 72.  Hepatitis C blood test.  Hepatitis B blood test.  Sexually transmitted disease (STD) testing.  Diabetes screening. This is done by checking your blood sugar (glucose) after you have not eaten for a while (fasting). You may have this done every 1-3 years.  Bone density scan. This is done to screen for osteoporosis. You may have this done starting at age 27.  Mammogram. This may be done every 1-2 years. Talk to your health care provider about how often you should have regular mammograms. Talk with your health care provider about your test results, treatment options, and if necessary, the  need for more tests. Vaccines  Your health care provider may recommend certain vaccines, such as:  Influenza vaccine. This is recommended every year.  Tetanus, diphtheria, and acellular pertussis (Tdap, Td) vaccine. You may need a  Td booster every 10 years.  Zoster vaccine. You may need this after age 41.  Pneumococcal 13-valent conjugate (PCV13) vaccine. One dose is recommended after age 62.  Pneumococcal polysaccharide (PPSV23) vaccine. One dose is recommended after age 29. Talk to your health care provider about which screenings and vaccines you need and how often you need them. This information is not intended to replace advice given to you by your health care provider. Make sure you discuss any questions you have with your health care provider. Document Released: 12/10/2015 Document Revised: 08/02/2016 Document Reviewed: 09/14/2015 Elsevier Interactive Patient Education  2017 [employer] Prevention in the Home Falls can cause injuries. They can happen to people of all ages. There are many things you can do to make your home safe and to help prevent falls. What can I do on the outside of my home?  Regularly fix the edges of walkways and driveways and fix any cracks.  Remove anything that might make you trip as you walk through a door, such as a raised step or threshold.  Trim any bushes or trees on the path to your home.  Use bright outdoor lighting.  Clear any walking paths of anything that might make someone trip, such as rocks or tools.  Regularly check to see if handrails are loose or broken. Make sure that both sides of any steps have handrails.  Any raised decks and porches should have guardrails on the edges.  Have any leaves, snow, or ice cleared regularly.  Use sand or salt on walking paths during winter.  Clean up any spills in your garage right away. This includes oil or grease spills. What can I do in the bathroom?  Use night lights.  Install grab bars by the toilet and in the tub and shower. Do not use towel bars as grab bars.  Use non-skid mats or decals in the tub or shower.  If you need to sit down in the shower, use a plastic, non-slip stool.  Keep the floor dry. Clean  up any water that spills on the floor as soon as it happens.  Remove soap buildup in the tub or shower regularly.  Attach bath mats securely with double-sided non-slip rug tape.  Do not have throw rugs and other things on the floor that can make you trip. What can I do in the bedroom?  Use night lights.  Make sure that you have a light by your bed that is easy to reach.  Do not use any sheets or blankets that are too big for your bed. They should not hang down onto the floor.  Have a firm chair that has side arms. You can use this for support while you get dressed.  Do not have throw rugs and other things on the floor that can make you trip. What can I do in the kitchen?  Clean up any spills right away.  Avoid walking on wet floors.  Keep items that you use a lot in easy-to-reach places.  If you need to reach something above you, use a strong step stool that has a grab bar.  Keep electrical cords out of the way.  Do not use floor polish or wax that makes floors slippery. If you must use wax,  use non-skid floor wax.  Do not have throw rugs and other things on the floor that can make you trip. What can I do with my stairs?  Do not leave any items on the stairs.  Make sure that there are handrails on both sides of the stairs and use them. Fix handrails that are broken or loose. Make sure that handrails are as long as the stairways.  Check any carpeting to make sure that it is firmly attached to the stairs. Fix any carpet that is loose or worn.  Avoid having throw rugs at the top or bottom of the stairs. If you do have throw rugs, attach them to the floor with carpet tape.  Make sure that you have a light switch at the top of the stairs and the bottom of the stairs. If you do not have them, ask someone to add them for you. What else can I do to help prevent falls?  Wear shoes that:  Do not have high heels.  Have rubber bottoms.  Are comfortable and fit you well.  Are  closed at the toe. Do not wear sandals.  If you use a stepladder:  Make sure that it is fully opened. Do not climb a closed stepladder.  Make sure that both sides of the stepladder are locked into place.  Ask someone to hold it for you, if possible.  Clearly mark and make sure that you can see:  Any grab bars or handrails.  First and last steps.  Where the edge of each step is.  Use tools that help you move around (mobility aids) if they are needed. These include:  Canes.  Walkers.  Scooters.  Crutches.  Turn on the lights when you go into a dark area. Replace any light bulbs as soon as they burn out.  Set up your furniture so you have a clear path. Avoid moving your furniture around.  If any of your floors are uneven, fix them.  If there are any pets around you, be aware of where they are.  Review your medicines with your doctor. Some medicines can make you feel dizzy. This can increase your chance of falling. Ask your doctor what other things that you can do to help prevent falls. This information is not intended to replace advice given to you by your health care provider. Make sure you discuss any questions you have with your health care provider. Document Released: 09/09/2009 Document Revised: 04/20/2016 Document Reviewed: 12/18/2014 Elsevier Interactive Patient Education  2017 [employer].

## 2019-10-21 NOTE — Progress Notes (Signed)
Subjective:   [name] is a 85 y.o. female who presents for Medicare Annual (Subsequent) preventive examination.  Review of Systems: N/A   This visit is being conducted through telemedicine via telephone at the nurse health advisor's home address due to the COVID-19 pandemic. This patient has given me verbal consent via doximity to conduct this visit, patient states they are participating from their home address. Patient and myself are on the telephone call. There is no referral for this visit. Some vital signs may be absent or patient reported.    Patient identification: identified by name, DOB, and current address   Cardiac Risk Factors include: advanced age (>16men, >30 women);hypertension;dyslipidemia     Objective:     Vitals: LMP 03/28/1989   There is no height or weight on file to calculate BMI.  Advanced Directives 10/21/2019 09/20/2017 03/10/2017 01/09/2017  Does Patient Have a Medical Advance Directive? No No No No  Would patient like information on creating a medical advance directive? No - Patient declined Yes (MAU/Ambulatory/Procedural Areas - Information given) - -    Tobacco Social History   Tobacco Use  Smoking Status Never Smoker  Smokeless Tobacco Never Used     Counseling given: Not Answered   Clinical Intake:  Pre-visit preparation completed: Yes  Pain : No/denies pain     Nutritional Risks: None Diabetes: No  How often do you need to have someone help you when you read instructions, pamphlets, or other written materials from your doctor or pharmacy?: 1 - Never What is the last grade level you completed in school?: 9th  Interpreter Needed?: No  Information entered by :: CJohnson, LPN  Past Medical History:  Diagnosis Date  . Anxiety   . Barrett esophagus   . Basal cell carcinoma   . Diplopia    history of,hospital 2/26-2/27/10  . Diverticulitis    colonoscopy 1995.  CT 01/31/09, 02/02/09  . Hiatal hernia   . Hiatal hernia    EGD 1998, 2007 ( Dr. Sharlett Iles)  . Hyperlipemia   . Hypertension   . Hypothyroidism   . Osteoporosis DXA 2015  . Personal history of colonic polyps    colonoscpy, removed 1995  . Thyroid carcinoma ([place]) 01/1994  . UTI (urinary tract infection)    Chronic   Past Surgical History:  Procedure Laterality Date  . ABDOMINAL HYSTERECTOMY    . CHOLECYSTECTOMY    . HIP SURGERY  08/1993   due to fracture of right hip  . SKIN CANCER EXCISION     basal cell left hand, forehead  . THYROIDECTOMY  04/1994   thyroid cancer   Family History  Problem Relation Age of Onset  . Cancer Brother        Pharyngeal   . Heart disease Brother        MI  . Diabetes Brother   . Diabetes Sister        amputation  . Hypertension Mother   . Stroke Father   . Kidney cancer Neg Hx   . Bladder Cancer Neg Hx    Social History   Socioeconomic History  . Marital status: Widowed    Spouse name: Not on file  . Number of children: 0  . Years of education: Not on file  . Highest education level: Not on file  Occupational History  . Occupation: retired    Comment: Publishing copy closed [employer]  . Financial resource strain: Not hard at all  . Food  insecurity    Worry: Never true    Inability: Never true  . Transportation needs    Medical: No    Non-medical: No  Tobacco Use  . Smoking status: Never Smoker  . Smokeless tobacco: Never Used  Substance and Sexual Activity  . Alcohol use: No    Alcohol/week: 0.0 standard drinks  . Drug use: No  . Sexual activity: Not on file  Lifestyle  . Physical activity    Days per week: 0 days    Minutes per session: 0 min  . Stress: Not at all  Relationships  . Social Herbalist on phone: Not on file    Gets together: Not on file    Attends religious service: Not on file    Active member of club or organization: Not on file    Attends meetings of clubs or organizations: Not on file    Relationship status: Not on file  Other  Topics Concern  . Not on file  Social History Narrative   Widowed.  Final sib died in 2013-02-26   1 step son out of state, in [place] alone, drives as of [phone]    Outpatient Encounter Medications as of 10/21/2019  Medication Sig  . Artificial Saliva (BIOTENE DRY MOUTH MOISTURIZING) SOLN Use as directed in the mouth or throat.  . B Complex Vitamins (B COMPLEX-B12 PO) Take by mouth. Taken daily  . cholecalciferol (VITAMIN D3) 25 MCG (1000 UT) tablet Take 2,000 Units by mouth daily.  Marland Kitchen conjugated estrogens (PREMARIN) vaginal cream Place 1 Applicatorful vaginally daily. Apply 0.5mg  (pea-sized amount)  just inside the vaginal introitus with a finger-tip every night for two weeks and then 02/27/2023, Wednesday and Friday nights.  . Cyanocobalamin (VITAMIN B-12 PO) Take by mouth.  . ENSURE PLUS (ENSURE PLUS) LIQD Take 237 mLs by mouth.  Marland Kitchen HYDROcodone-acetaminophen (NORCO/VICODIN) 5-325 MG tablet Take 1 tablet by mouth 2 (two) times daily.  . hyoscyamine (LEVSIN) 0.125 MG tablet Take 1 tablet (0.125 mg total) by mouth 3 (three) times daily as needed.  Marland Kitchen levothyroxine (SYNTHROID) 88 MCG tablet TAKE 1 TABLET BY MOUTH ONCE A DAY. TAKE ON AN EMPTY STOMACH WITH A GLASS OF WATER ATLEAST 30-60 MIN BEFORE BREAKFAST  . lidocaine (XYLOCAINE) 2 % solution Use 5-18ml every 4-6 hours as needed for pain  . lisinopril (ZESTRIL) 10 MG tablet TAKE ONE TABLET BY MOUTH DAILY AT BEDTIME  . loratadine ([employer] ALLERGY RELIEF) 10 MG dissolvable tablet Take 10 mg by mouth daily as needed for allergies.   Marland Kitchen LORazepam (ATIVAN) 0.5 MG tablet TAKE 1/2 TO 1 TABLET BY MOUTH TWICE DAILY AS NEEDED FOR ANXIETY  . mirtazapine (REMERON) 15 MG tablet TAKE 1 TABLET BY MOUTH AT BEDTIME  . omeprazole (PRILOSEC) 40 MG capsule TAKE 1 CAPSULE BY MOUTH EVERY MORNING 45MINUTES BEFORE BREAKFAST  . polyethylene glycol powder (MIRALAX) 17 GM/SCOOP powder Take 17 g by mouth 2 (two) times daily as needed.   Facility-Administered Encounter  Medications as of 10/21/2019  Medication  . 0.9 %  sodium chloride infusion    Activities of Daily Living In your present state of health, do you have any difficulty performing the following activities: 10/21/2019  Hearing? N  Vision? N  Difficulty concentrating or making decisions? N  Walking or climbing stairs? N  Dressing or bathing? N  Doing errands, shopping? N  Preparing Food and eating ? N  Using the Toilet? N  In the past six months, have  you accidently leaked urine? N  Do you have problems with loss of bowel control? N  Managing your Medications? N  Managing your Finances? N  Housekeeping or managing your Housekeeping? N  Some recent data might be hidden    Patient Care Team: [name], MD as PCP - General (Family Medicine)    Assessment:   This is a routine wellness examination for [place].  Exercise Activities and Dietary recommendations Current Exercise Habits: The patient does not participate in regular exercise at present, Exercise limited by: None identified  Goals    . Increase physical activity     Starting 09/20/17, I will attempt to drink at least 4-5 glasses of water daily.     . Patient Stated     10/21/2019, I will maintain and continue medications as prescribed.        Fall Risk Fall Risk  10/21/2019 10/15/2018 09/20/2017 09/19/2016 09/17/2015  Falls in the past year? 0 0 No Yes Yes  Number falls in past yr: 0 - - 2 or more 2 or more  Injury with Fall? 0 - - No Yes  Risk for fall due to : Medication side effect - - - -  Follow up Falls evaluation completed;Falls prevention discussed - - - -   Is the patient's home free of loose throw rugs in walkways, pet beds, electrical cords, etc?   yes      Grab bars in the bathroom? no      Handrails on the stairs?   yes      Adequate lighting?   yes  Timed Get Up and Go performed: N/A  Depression Screen PHQ 2/9 Scores 10/21/2019 10/15/2018 09/20/2017 09/19/2016  PHQ - 2 Score 0 0 0 1   PHQ- 9 Score 0 - 0 -     Cognitive Function MMSE - Mini Mental State Exam 10/21/2019 09/20/2017  Orientation to time 5 5  Orientation to Place 5 5  Registration 3 3  Attention/ Calculation 5 0  Recall 3 3  Language- name 2 objects - 0  Language- repeat 1 1  Language- follow 3 step command - 3  Language- read & follow direction - 0  Write a sentence - 0  Copy design - 0  Total score - 20  Mini Cog  Mini-Cog screen was completed. Maximum score is 22. A value of 0 denotes this part of the MMSE was not completed or the patient failed this part of the Mini-Cog screening.       Immunization History  Administered Date(s) Administered  . Influenza Split 09/05/2011, 09/23/2012  . Influenza Whole 09/20/2007, 08/25/2009, 09/27/2010  . Influenza,inj,Quad PF,6+ Mos 08/14/2013, 08/24/2014, 09/17/2015, 11/02/2016, 09/25/2017, 10/15/2018  . Pneumococcal Polysaccharide-23 05/20/2012  . Td 02/22/2012  . Zoster 09/13/2010    Qualifies for Shingles Vaccine? Yes  Screening Tests Health Maintenance  Topic Date Due  . PNA vac Low Risk Adult (2 of 2 - PCV13) 05/20/2013  . INFLUENZA VACCINE  06/28/2019  . TETANUS/TDAP  02/21/2022  . DEXA SCAN  Completed    Cancer Screenings: Lung: Low Dose CT Chest recommended if Age 71-80 years, 30 pack-year currently smoking OR have quit w/in 15years. Patient does not qualify. Breast:  Up to date on Mammogram? Yes, no longer required   Up to date of Bone Density/Dexa? Yes, completed 10/29/2014 Colorectal: no longer required  Additional Screenings:  Hepatitis C Screening: N/A     Plan:    Patient will maintain and continue medications as  prescribed.    I have personally reviewed and noted the following in the patient's chart:   . Medical and social history . Use of alcohol, tobacco or illicit drugs  . Current medications and supplements . Functional ability and status . Nutritional status . Physical activity . Advanced directives . List of  other physicians . Hospitalizations, surgeries, and ER visits in previous 12 months . Vitals . Screenings to include cognitive, depression, and falls . Referrals and appointments  In addition, I have reviewed and discussed with patient certain preventive protocols, quality metrics, and best practice recommendations. A written personalized care plan for preventive services as well as general preventive health recommendations were provided to patient.     Andrez Grime, LPN  [phone]

## 2019-10-21 NOTE — Progress Notes (Signed)
PCP notes:  Health Maintenance: Needs flu and prevnar 13   Abnormal Screenings: none   Patient concerns: none    Nurse concerns: none   Next PCP appt: 10/28/2019 @ 12 pm

## 2019-10-22 ENCOUNTER — Ambulatory Visit: Payer: Medicare Other

## 2019-10-28 ENCOUNTER — Encounter: Payer: Self-pay | Admitting: Family Medicine

## 2019-10-28 ENCOUNTER — Other Ambulatory Visit: Payer: Self-pay

## 2019-10-28 ENCOUNTER — Ambulatory Visit (INDEPENDENT_AMBULATORY_CARE_PROVIDER_SITE_OTHER): Payer: Medicare Other | Admitting: Family Medicine

## 2019-10-28 VITALS — BP 124/84 | HR 100 | Temp 97.3°F | Ht 61.5 in | Wt 99.4 lb

## 2019-10-28 DIAGNOSIS — E559 Vitamin D deficiency, unspecified: Secondary | ICD-10-CM

## 2019-10-28 DIAGNOSIS — Z23 Encounter for immunization: Secondary | ICD-10-CM

## 2019-10-28 DIAGNOSIS — I10 Essential (primary) hypertension: Secondary | ICD-10-CM

## 2019-10-28 DIAGNOSIS — G8929 Other chronic pain: Secondary | ICD-10-CM

## 2019-10-28 DIAGNOSIS — E039 Hypothyroidism, unspecified: Secondary | ICD-10-CM | POA: Diagnosis not present

## 2019-10-28 DIAGNOSIS — K2270 Barrett's esophagus without dysplasia: Secondary | ICD-10-CM

## 2019-10-28 DIAGNOSIS — K589 Irritable bowel syndrome without diarrhea: Secondary | ICD-10-CM

## 2019-10-28 DIAGNOSIS — R739 Hyperglycemia, unspecified: Secondary | ICD-10-CM

## 2019-10-28 DIAGNOSIS — Z0000 Encounter for general adult medical examination without abnormal findings: Secondary | ICD-10-CM

## 2019-10-28 DIAGNOSIS — Z7189 Other specified counseling: Secondary | ICD-10-CM

## 2019-10-28 LAB — POCT GLYCOSYLATED HEMOGLOBIN (HGB A1C): Hemoglobin A1C: 5.6 % (ref 4.0–5.6)

## 2019-10-28 MED ORDER — LEVOTHYROXINE SODIUM 88 MCG PO TABS: ORAL_TABLET | ORAL | 3 refills | Status: DC

## 2019-10-28 MED ORDER — CILIDINIUM-CHLORDIAZEPOXIDE 2.5-5 MG PO CAPS: 1.0000 | ORAL_CAPSULE | Freq: Every day | ORAL | Status: DC

## 2019-10-28 NOTE — Progress Notes (Signed)
This visit occurred during the SARS-CoV-2 public health emergency.  Safety protocols were in place, including screening questions prior to the visit, additional usage of staff PPE, and extensive cleaning of exam room while observing appropriate contact time as indicated for disinfecting solutions.   She was able to get librax and that helped; she felt worse off medicine. Her appetite is good on med.    No ADE on pain meds, with relief.  "I couldn't go without it."  Compliant.  Not sedated.   History of GERD/Barrett's.  Still on PPI.  Compliant.  No dysphagia.  Hypothyroidism.  Compliant.  No dysphagia.  No neck mass.  TSH normal.  Labs discussed with patient.  Flu shot today.   Shingles 2011 PNA deferred today per patient preference.   tetanus 2013 Colonoscopy not due given age >96.  Breast cancer screening-  Encouraged, she'll consider.   DXA not due, d/w pt. She declined treatment prev.  Advance directive- friend May Lamm designated if patient were incapacitated.  Sugar up. She had a snack prior to arrival.  She has never been diabetic d/w pt. A1c still normal.  Discussed with patient.  Vit D low.  Better than prev, but not yet normal.  Already on replacement.  Discussed.  Hypertension:    Using medication without problems or lightheadedness: yes Chest pain with exertion:no Edema:rarely, resolved now.  Short of breath:no  PMH and SH reviewed  ROS: Per HPI unless specifically indicated in ROS section   Meds, vitals, and allergies reviewed.   GEN: nad, alert and oriented HEENT: ncat NECK: supple w/o LA CV: rrr. PULM: ctab, no inc wob ABD: soft, +bs EXT: no edema SKIN: no acute rash

## 2019-10-28 NOTE — Patient Instructions (Signed)
Don't change your meds for now.   Update me as needed.  Plan on recheck in about 4 months about your pain medicine.  Let the pharmacy know when you need a refill.   Take care.  Glad to see you.

## 2019-10-30 NOTE — Assessment & Plan Note (Signed)
Compliant.  No dysphagia.  No neck mass.  TSH normal.  Labs discussed with patient.  Continue as is.  She agrees. >25 minutes spent in face to face time with patient, >50% spent in counselling or coordination of care

## 2019-10-30 NOTE — Assessment & Plan Note (Signed)
Better than prev, but not yet normal.  Already on replacement.  Discussed.  Continue as is, I would expect it to continue to improve with replacement.

## 2019-10-30 NOTE — Assessment & Plan Note (Signed)
She was able to get librax and that helped; she felt worse off medicine. Her appetite is good on med.   Continue as is.  She agrees.  She will update me as needed.

## 2019-10-30 NOTE — Assessment & Plan Note (Signed)
History of GERD/Barrett's.  Still on PPI.  Compliant.  No dysphagia.  Continue as is.  She agrees.

## 2019-10-30 NOTE — Assessment & Plan Note (Signed)
No ADE on pain meds, with relief.  "I couldn't go without it."  Compliant.  Not sedated.  Continue as is.  She agrees.

## 2019-10-30 NOTE — Assessment & Plan Note (Signed)
Advance directive- friend May Lamm designated if patient were incapacitated.

## 2019-10-30 NOTE — Assessment & Plan Note (Signed)
No change in meds.  Continue as is.  She agrees.

## 2019-10-30 NOTE — Assessment & Plan Note (Signed)
Flu shot today.   Shingles 2011 PNA deferred today per patient preference.   tetanus 2013 Colonoscopy not due given age >52.  Breast cancer screening-  Encouraged, she'll consider.   DXA not due, d/w pt. She declined treatment prev.  Advance directive- friend May Lamm designated if patient were incapacitated.

## 2019-11-03 ENCOUNTER — Other Ambulatory Visit: Payer: Self-pay | Admitting: Family Medicine

## 2019-11-03 NOTE — Telephone Encounter (Signed)
Name of Medication: Hydrocodone Name of Pharmacy: [facility] or Written Date and Quantity:  180 tablet 0 08/08/2019  Last Office Visit and Type: 10/28/2019 FU   Next Office Visit and Type: None Last Controlled Substance Agreement Date: 08/07/2013 Last UDS:10/15/18

## 2019-11-04 NOTE — Telephone Encounter (Signed)
Sent. Thanks.   

## 2019-11-27 ENCOUNTER — Other Ambulatory Visit: Payer: Self-pay | Admitting: Family Medicine

## 2019-12-01 NOTE — Telephone Encounter (Signed)
Electronic refill request. Lorazepam Last office visit:   10/28/2019 Last Filled:    40 tablet 0 10/01/2019   Please advise.

## 2019-12-01 NOTE — Telephone Encounter (Signed)
Patient left a voicemail requesting that the refill be done. Patient stated that she went Thursday to get this refill and it had not been refilled yet.

## 2019-12-01 NOTE — Telephone Encounter (Signed)
Sent. Thanks.   

## 2019-12-30 ENCOUNTER — Telehealth: Payer: Self-pay

## 2019-12-30 NOTE — Telephone Encounter (Signed)
Pt left v/m and she thinks she wants to take the covid vaccine but pt is allergic to penicillin and per allergy med list other abx and other meds. Pt wants to know if Dr [name] thinks it is OK for pt to take the covid vaccine. Please advise.

## 2019-12-30 NOTE — Telephone Encounter (Signed)
Should be okay to get the vaccine.  Even with med allergies listed, she can still get other vaccines and should be okay with covid vaccine.  Thanks.

## 2019-12-30 NOTE — Telephone Encounter (Signed)
Patient advised.

## 2020-01-01 ENCOUNTER — Other Ambulatory Visit: Payer: Self-pay | Admitting: Family Medicine

## 2020-01-01 NOTE — Telephone Encounter (Signed)
Electronic refill request. Mirtazapine Last office visit:   10/28/2019 Last Filled:   #90  3 RF on 10/22/2018 Please advise.

## 2020-01-02 NOTE — Telephone Encounter (Signed)
Sent. Thanks.   

## 2020-02-12 ENCOUNTER — Other Ambulatory Visit: Payer: Self-pay | Admitting: Family Medicine

## 2020-02-12 NOTE — Telephone Encounter (Signed)
Last office visit 10/28/2019 for CPE.  Last refilled 11/04/2019 for #180 with no refills.  UDS/Contract 10/15/2018.  No future appointments.  Ok to refill?

## 2020-02-13 NOTE — Telephone Encounter (Signed)
[place] CSRS reviewed ERx 

## 2020-02-24 ENCOUNTER — Other Ambulatory Visit: Payer: Self-pay | Admitting: Family Medicine

## 2020-02-24 NOTE — Telephone Encounter (Signed)
Electronic refill request. [name] Last office visit:   10/28/2019 Last Filled:   08/01/2019    #90   1 RF Please advise.

## 2020-02-24 NOTE — Telephone Encounter (Addendum)
Electronic refill request. Lorazepam Last office visit:   10/28/2019 Last Filled:    40 tablet 1 12/01/2019   Electronic refill request. Mirtazapine Last office visit:   10/28/2019 Last Filled:   90 tablet 3 01/02/2020      :

## 2020-02-25 NOTE — Telephone Encounter (Signed)
I sent the lorazepam prescription.  She should have plenty of refills left on mirtazapine.  Please check with pharmacy about that.  Thanks.

## 2020-02-25 NOTE — Telephone Encounter (Signed)
Spoke to [employer] at [facility] and was advised to disregard the request for Mirtazapine because they do have refills on the 15 mg.

## 2020-02-25 NOTE — Telephone Encounter (Signed)
Sent. Thanks.   

## 2020-03-15 ENCOUNTER — Telehealth: Payer: Self-pay | Admitting: Family Medicine

## 2020-03-15 NOTE — Telephone Encounter (Signed)
Called patient but line was busy, still needs to be scheduled for 30 min OV follow up in May.

## 2020-03-17 NOTE — Telephone Encounter (Signed)
Called and got patient scheduled for 30 minute office visit in May!

## 2020-03-24 ENCOUNTER — Other Ambulatory Visit: Payer: Self-pay | Admitting: Family Medicine

## 2020-04-12 ENCOUNTER — Ambulatory Visit: Payer: Medicare Other | Admitting: Family Medicine

## 2020-04-22 ENCOUNTER — Encounter: Payer: Self-pay | Admitting: Family Medicine

## 2020-04-22 ENCOUNTER — Other Ambulatory Visit: Payer: Self-pay

## 2020-04-22 ENCOUNTER — Ambulatory Visit (INDEPENDENT_AMBULATORY_CARE_PROVIDER_SITE_OTHER): Payer: Medicare Other | Admitting: Family Medicine

## 2020-04-22 ENCOUNTER — Ambulatory Visit (INDEPENDENT_AMBULATORY_CARE_PROVIDER_SITE_OTHER)
Admission: RE | Admit: 2020-04-22 | Discharge: 2020-04-22 | Disposition: A | Discharge status: Home / Self Care | Payer: Medicare Other | Source: Ambulatory Visit | Attending: Family Medicine | Admitting: Family Medicine

## 2020-04-22 VITALS — BP 128/66 | HR 99 | Temp 97.9°F | Ht 61.5 in | Wt 95.8 lb

## 2020-04-22 DIAGNOSIS — S59901A Unspecified injury of right elbow, initial encounter: Secondary | ICD-10-CM | POA: Diagnosis not present

## 2020-04-22 DIAGNOSIS — R634 Abnormal weight loss: Secondary | ICD-10-CM

## 2020-04-22 DIAGNOSIS — G8929 Other chronic pain: Secondary | ICD-10-CM

## 2020-04-22 DIAGNOSIS — M25521 Pain in right elbow: Secondary | ICD-10-CM

## 2020-04-22 MED ORDER — HYDROCODONE-ACETAMINOPHEN 5-325 MG PO TABS: 1.0000 | ORAL_TABLET | Freq: Two times a day (BID) | ORAL | 0 refills | Status: DC

## 2020-04-22 NOTE — Progress Notes (Signed)
This visit occurred during the SARS-CoV-2 public health emergency.  Safety protocols were in place, including screening questions prior to the visit, additional usage of staff PPE, and extensive cleaning of exam room while observing appropriate contact time as indicated for disinfecting solutions.  We talked about covid cautions.  She had pfizer vaccine x2.  Some mild L ankle swelling after she had injured her L foot in the distant past.  This swelling is chronic, not new.  Not painful now but can be uncomfortable occ.    Weight loss. Taking ensure.  She had recent dec in appetite.  She has seen GI prev about this.  No blood in stool.  She is still cooking some, eating out some.  No fevers.    She is having trouble with hemorrhoids.  She has used prep H recently, with relief of pain.  She still alternates from loose to harder stools.  No greasy stools unless using prep H.    Indication for chronic opioid:chronicL sciaticaand back pain Medication and dose:hydrocodone 5/325mg  # pills per month:60/month, 180 per 3 month Last UDS date:10/15/18 Pain contract signed (Y/N):yes Date narcotic database last reviewed (include red flags): 04/22/20   Pain inventory (1-10) Average pain:  Variable, see below.   Pain now: okay when I'm sitting here, more pain walking.   My pain is constant unless sitting.   Pain is worse with movement.   Relief from meds: yes  In the last 24 hours, how much has pain interfered with the following (1-10 greatest interference)? General activity- some limitation, she can't walk in a store as long as she would like.   Relationships with others- no limitation.   Enjoyment of life- limited more by covid, less with pain.   What time of the day is the pain the worst- with movement.   Sleep is described by patient as "good."   Mobility/function Assistance device: rare use of walker.   How many minutes can you walk: short distances.   Able to climb steps: yes, if  needed.   Driving: yes Disabled: no  Bowel or bladder symptoms: see above.   Mood: good but tired of covid restrictions and stressors.    Meds, vitals, and allergies reviewed.   ROS: Per HPI unless specifically indicated in ROS section   GEN: nad, alert and oriented HEENT: ncat NECK: supple w/o LA CV: rrr PULM: ctab, no inc wob ABD: soft, +bs EXT: no edema on right ankle but trace left ankle edema.  SKIN: no acute rash L lower back ttp, S/S grossly wnl BLE

## 2020-04-22 NOTE — Patient Instructions (Signed)
Use prep H daily and let us know if that doesn't help.  Let me check with the GI clinic in the meantime about options for your weight.  I sent your pain medicine to the pharmacy.  Update me as needed.  Take care.  Glad to see you.

## 2020-04-23 ENCOUNTER — Telehealth: Payer: Self-pay

## 2020-04-23 NOTE — Assessment & Plan Note (Signed)
  Indication for chronic opioid:chronicL sciaticaand back pain Medication and dose:hydrocodone 5/325mg  # pills per month:60/month, 180 per 3 month Last UDS date:10/15/18 Pain contract signed (Y/N):yes Date narcotic database last reviewed (include red flags): 04/22/20   No change in meds at this point.  Refill done.  She agrees.  Routine cautions d/w pt.    Addendum.  Patient left the clinic.  I saw her walking normally with a neutral gait in the clinic.  When she got into the parking lot, she reported that she caught her foot on the curb and fell on her right side.  No LOC.  Called for help, staff to parking lot immediately, patient was placed in wheelchair and brought back to clinic.  Speaking normally with 3 abrasions.   R brow 0.5x0.5 cm R knee 3 x 1.5cm R olecranon 3x1.5 cm  All superficial.    Recheck vitals T 98.1 p77 98% RA 128/64.   She wasn't lightheaded, still with RRR and CN 2-12 wnl B, S/S wnl x4.    All lesions cleaned and dressed and minor bleeding controlled w/o indication for suture.  R elbow xray neg, d/w pt.   She called a 3rd party for transport home and patient was monitored until she left clinic.  She felt well enough to go home and will update [name] tomorrow.  No indication for head CT or ER eval.  Pt agreed with plan.

## 2020-04-23 NOTE — Telephone Encounter (Signed)
Pt left v/m that she was to cb today with update on how pt was feeling after fall yesterday. Pt said she was doing OK; just sore all over but feeling OK. FYI to Dr [name].

## 2020-04-23 NOTE — Assessment & Plan Note (Signed)
I will d/w the GI clinic and we'll go from there.  Benign abd exam.  No new sx.  Pt agrees.

## 2020-04-26 NOTE — Telephone Encounter (Signed)
Noted. Thanks.

## 2020-04-29 ENCOUNTER — Other Ambulatory Visit: Payer: Self-pay | Admitting: Family Medicine

## 2020-04-29 NOTE — Telephone Encounter (Signed)
Electronic refill request. Lorazepam Last office visit:   04/22/2020 Last Filled:    40 tablet 1 02/25/2020  Please advise.

## 2020-04-30 NOTE — Telephone Encounter (Signed)
Sent. Thanks.   

## 2020-05-03 ENCOUNTER — Telehealth: Payer: Self-pay

## 2020-05-03 ENCOUNTER — Telehealth: Payer: Self-pay | Admitting: Gastroenterology

## 2020-05-03 ENCOUNTER — Other Ambulatory Visit: Payer: Self-pay | Admitting: Family Medicine

## 2020-05-03 DIAGNOSIS — R634 Abnormal weight loss: Secondary | ICD-10-CM

## 2020-05-03 DIAGNOSIS — R1032 Left lower quadrant pain: Secondary | ICD-10-CM

## 2020-05-03 NOTE — Telephone Encounter (Signed)
Left message for patient to call back  

## 2020-05-03 NOTE — Telephone Encounter (Signed)
See phone notes for details.  

## 2020-05-03 NOTE — Telephone Encounter (Signed)
Patient notified She will come for labs in the next few days Follow up arranged for 06/25/20

## 2020-05-03 NOTE — Telephone Encounter (Signed)
-----   Message from Yetta Flock, MD sent at 05/03/2020  9:23 AM EDT ----- Barbera Setters just wanted to follow up on this.  Can you please reach out to order CBC, CMET, fecal pancreatic elastase for this patient and coordinate a follow up with me? Thanks  Dr. Loni Muse    ----- Message ----- From: [name], MD Sent: 04/28/2020  10:20 PM EDT To: Yetta Flock, MD  Many thanks.   Brigitte Pulse ----- Message ----- From: Yetta Flock, MD Sent: 04/27/2020  11:28 AM EDT To: [name], MD  Hi, Just looked through her chart, she is down about 6 lbs since I saw her last Jan 2020. She has had a few CT scans over the years for this without anything too concerning. Not sure how she is eating lately, she did not have anything too concerning on prior EGD. May be good to recheck her basic labs. 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 I can have our office reach out to her to do this and schedule a follow up with me as I haven't seen her on over a year. Thanks    Jelani Trueba, can you help schedule this patient a follow up with me, and ask her to go to the lab for CBC, CMET, and fecal pancreatic elastase testing? Thanks  Richardson Landry  ----- Message ----- From: [name], MD Sent: 04/26/2020  11:04 PM EDT To: Yetta Flock, MD  Please talk to me about this patient.  She is still losing weight.  Is it worth an empiric trial of something like Creon?  I would really like your input.  Thank you very much.  Take care.  Brigitte Pulse

## 2020-05-03 NOTE — Addendum Note (Signed)
Addended by: Marlon Pel on: 05/03/2020 02:58 PM   Modules accepted: Orders

## 2020-05-04 ENCOUNTER — Other Ambulatory Visit: Payer: Self-pay

## 2020-05-04 ENCOUNTER — Other Ambulatory Visit (INDEPENDENT_AMBULATORY_CARE_PROVIDER_SITE_OTHER): Payer: Medicare Other

## 2020-05-04 ENCOUNTER — Telehealth: Payer: Self-pay | Admitting: Radiology

## 2020-05-04 DIAGNOSIS — R1032 Left lower quadrant pain: Secondary | ICD-10-CM | POA: Diagnosis not present

## 2020-05-04 DIAGNOSIS — G8929 Other chronic pain: Secondary | ICD-10-CM

## 2020-05-04 DIAGNOSIS — R634 Abnormal weight loss: Secondary | ICD-10-CM | POA: Diagnosis not present

## 2020-05-04 LAB — COMPREHENSIVE METABOLIC PANEL
ALT: 10 U/L (ref 0–35)
AST: 22 U/L (ref 0–37)
Albumin: 4.1 g/dL (ref 3.5–5.2)
Alkaline Phosphatase: 72 U/L (ref 39–117)
BUN: 12 mg/dL (ref 6–23)
CO2: 31 mEq/L (ref 19–32)
Calcium: 9 mg/dL (ref 8.4–10.5)
Chloride: 99 mEq/L (ref 96–112)
Creatinine, Ser: 0.9 mg/dL (ref 0.40–1.20)
GFR: 59.12 mL/min — ABNORMAL LOW (ref >60.00)
Glucose, Bld: 126 mg/dL — ABNORMAL HIGH (ref 70–99)
Potassium: 4.4 mEq/L (ref 3.5–5.1)
Sodium: 133 mEq/L — ABNORMAL LOW (ref 135–145)
Total Bilirubin: 0.6 mg/dL (ref 0.2–1.2)
Total Protein: 7.6 g/dL (ref 6.0–8.3)

## 2020-05-04 LAB — CBC
HCT: 36.3 % (ref 36.0–46.0)
Hemoglobin: 12.6 g/dL (ref 12.0–15.0)
MCHC: 34.7 g/dL (ref 30.0–36.0)
MCV: 90.6 fl (ref 78.0–100.0)
Platelets: 237 10*3/uL (ref 150.0–400.0)
RBC: 4 Mil/uL (ref 3.87–5.11)
RDW: 12.7 % (ref 11.5–15.5)
WBC: 7.5 10*3/uL (ref 4.0–10.5)

## 2020-05-04 NOTE — Telephone Encounter (Signed)
Patient had labs today, she has questions about her medication, a medication you add to another made her sick and she has d/c'd it. She didn't remember the name

## 2020-05-05 ENCOUNTER — Other Ambulatory Visit: Payer: Medicare Other

## 2020-05-05 DIAGNOSIS — R1032 Left lower quadrant pain: Secondary | ICD-10-CM | POA: Diagnosis not present

## 2020-05-05 DIAGNOSIS — R634 Abnormal weight loss: Secondary | ICD-10-CM | POA: Diagnosis not present

## 2020-05-05 LAB — TIQ- AMBIGUOUS ORDER: UNCLEAR ORDER:: 92489

## 2020-05-05 NOTE — Telephone Encounter (Signed)
Please see what information you can get from patient about her medications.  Thanks.

## 2020-05-06 NOTE — Telephone Encounter (Signed)
Appointment scheduled.

## 2020-05-06 NOTE — Telephone Encounter (Addendum)
Patient says it is the medication that was prescribed with her [name] but she could never tell me the medication name.  Patient states she had taken it for about 30 days prior to this but when she picked up this Rx, it has made her sick.  I called the pharmacy and they say that she picked up the Hydrocodone and the [name] within several days of one another so it must be the Hydrocodone that she is speaking about.  Patient was very confused and said she had missed a call from GI because she couldn't get away from toilet long enough to get the phone.

## 2020-05-06 NOTE — Telephone Encounter (Signed)
Can she come in for a 20min OV with all of her meds to review?

## 2020-05-07 ENCOUNTER — Ambulatory Visit (INDEPENDENT_AMBULATORY_CARE_PROVIDER_SITE_OTHER): Payer: Medicare Other | Admitting: Family Medicine

## 2020-05-07 ENCOUNTER — Other Ambulatory Visit: Payer: Self-pay

## 2020-05-07 ENCOUNTER — Encounter: Payer: Self-pay | Admitting: Family Medicine

## 2020-05-07 DIAGNOSIS — R634 Abnormal weight loss: Secondary | ICD-10-CM | POA: Diagnosis not present

## 2020-05-07 MED ORDER — VITAMIN D3 10 MCG (400 UNIT) PO CAPS: 800.0000 [IU] | ORAL_CAPSULE | Freq: Every day | ORAL | Status: DC

## 2020-05-07 NOTE — Patient Instructions (Signed)
Don't change your meds for now.  We'll check on the pending lab test and I'll update the GI clinic.  We'll go from there.  Take care.  Glad to see you.

## 2020-05-07 NOTE — Progress Notes (Signed)
This visit occurred during the SARS-CoV-2 public health emergency.  Safety protocols were in place, including screening questions prior to the visit, additional usage of staff PPE, and extensive cleaning of exam room while observing appropriate contact time as indicated for disinfecting solutions.  Med follow up.  We reviewed her med list to check for any discrepancy.  Med list verified and updated.    Discussed weight loss.  Has some LLQ discomfort.  Recent cmet and cbc unremarkable.  Fecal elastase pending.  She has the feeling of sensation of food sticking near the epigastrum while eating.    I will update GI that she dropped off her fecal elastase.  She had trouble getting in contact with them.    Memory d/w pt.  Prev confusion on pt's part was related to insurance coverage/part D coverage on librax.  Discussed.  She is aware of her current medications and oriented to the conversation.  Her legs episodically feel hot and that is atypical for patient.  Some mild BLE edema now.    Meds, vitals, and allergies reviewed.   ROS: Per HPI unless specifically indicated in ROS section   Thin elderly woman in no apparent distress ncat Neck supple, no LA rrr ctab LLQ minimally ttp w/o rebound, normal BS.  Soft abd o/w.   Extremities well perfused with trace lower extremity edema noted.

## 2020-05-09 LAB — DRUG MONITORING, PANEL 8 WITH CONFIRMATION, URINE
6 Acetylmorphine: NEGATIVE ng/mL (ref <10)
Alcohol Metabolites: NEGATIVE ng/mL
Alphahydroxyalprazolam: NEGATIVE ng/mL (ref <25)
Alphahydroxymidazolam: NEGATIVE ng/mL (ref <50)
Alphahydroxytriazolam: NEGATIVE ng/mL (ref <50)
Aminoclonazepam: NEGATIVE ng/mL (ref <25)
Amphetamines: NEGATIVE ng/mL (ref <500)
Benzodiazepines: POSITIVE ng/mL — AB (ref <100)
Buprenorphine, Urine: NEGATIVE ng/mL (ref <5)
Cocaine Metabolite: NEGATIVE ng/mL (ref <150)
Codeine: NEGATIVE ng/mL (ref <50)
Creatinine: 49.2 mg/dL
Hydrocodone: 531 ng/mL — ABNORMAL HIGH (ref <50)
Hydromorphone: 131 ng/mL — ABNORMAL HIGH (ref <50)
Hydroxyethylflurazepam: NEGATIVE ng/mL (ref <50)
Lorazepam: 308 ng/mL — ABNORMAL HIGH (ref <50)
MDMA: NEGATIVE ng/mL (ref <500)
Marijuana Metabolite: NEGATIVE ng/mL (ref <20)
Morphine: NEGATIVE ng/mL (ref <50)
Nordiazepam: NEGATIVE ng/mL (ref <50)
Norhydrocodone: 1083 ng/mL — ABNORMAL HIGH (ref <50)
Opiates: POSITIVE ng/mL — AB (ref <100)
Oxazepam: 424 ng/mL — ABNORMAL HIGH (ref <50)
Oxidant: NEGATIVE ug/mL
Oxycodone: NEGATIVE ng/mL (ref <100)
Temazepam: NEGATIVE ng/mL (ref <50)
pH: 8.4 (ref 4.5–9.0)

## 2020-05-09 LAB — TEST AUTHORIZATION

## 2020-05-09 LAB — DM TEMPLATE

## 2020-05-10 ENCOUNTER — Other Ambulatory Visit: Payer: Self-pay

## 2020-05-10 DIAGNOSIS — R1032 Left lower quadrant pain: Secondary | ICD-10-CM

## 2020-05-10 DIAGNOSIS — R634 Abnormal weight loss: Secondary | ICD-10-CM

## 2020-05-10 DIAGNOSIS — R1310 Dysphagia, unspecified: Secondary | ICD-10-CM

## 2020-05-10 NOTE — Assessment & Plan Note (Signed)
With GI follow-up pending and fecal elastase in progress.  I will update the GI clinic in the meantime.  If she is change in symptoms in the meantime then she will let me know.  We went through all of her medications together and her med list is accurate.  Reasonable to monitor the lower extremity edema in the meantime.  We did not change her medications at this point.

## 2020-05-10 NOTE — Progress Notes (Signed)
Error. Order already in

## 2020-05-13 LAB — PANCREATIC ELASTASE, FECAL: Pancreatic Elastase-1, Stool: >500 mcg/g

## 2020-05-14 ENCOUNTER — Emergency Department
Admission: EM | Admit: 2020-05-14 | Discharge: 2020-05-14 | Disposition: A | Discharge status: Home / Self Care | Payer: Medicare Other | Attending: Emergency Medicine | Admitting: Emergency Medicine

## 2020-05-14 ENCOUNTER — Encounter: Payer: Self-pay | Admitting: Emergency Medicine

## 2020-05-14 ENCOUNTER — Emergency Department: Payer: Medicare Other

## 2020-05-14 ENCOUNTER — Other Ambulatory Visit: Payer: Self-pay

## 2020-05-14 DIAGNOSIS — Z85850 Personal history of malignant neoplasm of thyroid: Secondary | ICD-10-CM | POA: Insufficient documentation

## 2020-05-14 DIAGNOSIS — I10 Essential (primary) hypertension: Secondary | ICD-10-CM | POA: Diagnosis not present

## 2020-05-14 DIAGNOSIS — K5900 Constipation, unspecified: Secondary | ICD-10-CM | POA: Insufficient documentation

## 2020-05-14 DIAGNOSIS — N390 Urinary tract infection, site not specified: Secondary | ICD-10-CM

## 2020-05-14 DIAGNOSIS — R300 Dysuria: Secondary | ICD-10-CM | POA: Insufficient documentation

## 2020-05-14 DIAGNOSIS — R110 Nausea: Secondary | ICD-10-CM | POA: Diagnosis not present

## 2020-05-14 DIAGNOSIS — R1010 Upper abdominal pain, unspecified: Secondary | ICD-10-CM | POA: Diagnosis not present

## 2020-05-14 DIAGNOSIS — R140 Abdominal distension (gaseous): Secondary | ICD-10-CM | POA: Insufficient documentation

## 2020-05-14 DIAGNOSIS — Z85828 Personal history of other malignant neoplasm of skin: Secondary | ICD-10-CM | POA: Insufficient documentation

## 2020-05-14 DIAGNOSIS — E039 Hypothyroidism, unspecified: Secondary | ICD-10-CM | POA: Diagnosis not present

## 2020-05-14 DIAGNOSIS — Z79899 Other long term (current) drug therapy: Secondary | ICD-10-CM | POA: Diagnosis not present

## 2020-05-14 DIAGNOSIS — R1084 Generalized abdominal pain: Secondary | ICD-10-CM | POA: Diagnosis not present

## 2020-05-14 DIAGNOSIS — R1030 Lower abdominal pain, unspecified: Secondary | ICD-10-CM | POA: Diagnosis not present

## 2020-05-14 DIAGNOSIS — R109 Unspecified abdominal pain: Secondary | ICD-10-CM | POA: Diagnosis not present

## 2020-05-14 LAB — CBC
HCT: 38.2 % (ref 36.0–46.0)
Hemoglobin: 13.3 g/dL (ref 12.0–15.0)
MCH: 30.4 pg (ref 26.0–34.0)
MCHC: 34.8 g/dL (ref 30.0–36.0)
MCV: 87.4 fL (ref 80.0–100.0)
Platelets: 203 10*3/uL (ref 150–400)
RBC: 4.37 MIL/uL (ref 3.87–5.11)
RDW: 12.3 % (ref 11.5–15.5)
WBC: 6 10*3/uL (ref 4.0–10.5)
nRBC: 0 % (ref 0.0–0.2)

## 2020-05-14 LAB — COMPREHENSIVE METABOLIC PANEL
ALT: 11 U/L (ref 0–44)
AST: 24 U/L (ref 15–41)
Albumin: 4.4 g/dL (ref 3.5–5.0)
Alkaline Phosphatase: 71 U/L (ref 38–126)
Anion gap: 12 (ref 5–15)
BUN: 11 mg/dL (ref 8–23)
CO2: 27 mmol/L (ref 22–32)
Calcium: 9.3 mg/dL (ref 8.9–10.3)
Chloride: 101 mmol/L (ref 98–111)
Creatinine, Ser: 0.83 mg/dL (ref 0.44–1.00)
GFR calc Af Amer: >60 mL/min (ref >60)
GFR calc non Af Amer: >60 mL/min (ref >60)
Glucose, Bld: 111 mg/dL — ABNORMAL HIGH (ref 70–99)
Potassium: 4.2 mmol/L (ref 3.5–5.1)
Sodium: 140 mmol/L (ref 135–145)
Total Bilirubin: 1.1 mg/dL (ref 0.3–1.2)
Total Protein: 8.3 g/dL — ABNORMAL HIGH (ref 6.5–8.1)

## 2020-05-14 LAB — URINALYSIS, COMPLETE (UACMP) WITH MICROSCOPIC
Bilirubin Urine: NEGATIVE
Glucose, UA: NEGATIVE mg/dL
Hgb urine dipstick: NEGATIVE
Ketones, ur: 5 mg/dL — AB
Nitrite: NEGATIVE
Protein, ur: NEGATIVE mg/dL
Specific Gravity, Urine: 1.005 (ref 1.005–1.030)
WBC, UA: >50 WBC/hpf — ABNORMAL HIGH (ref 0–5)
pH: 6 (ref 5.0–8.0)

## 2020-05-14 LAB — LIPASE, BLOOD: Lipase: 25 U/L (ref 11–51)

## 2020-05-14 MED ORDER — ONDANSETRON 4 MG PO TBDP: 4.0000 mg | ORAL_TABLET | Freq: Four times a day (QID) | ORAL | 0 refills | Status: DC | PRN

## 2020-05-14 MED ORDER — ONDANSETRON HCL 4 MG/2ML IJ SOLN
4.0000 mg | Freq: Once | INTRAMUSCULAR | Status: AC
  Administered 2020-05-14: 4 mg via INTRAVENOUS
  Filled 2020-05-14: qty 2

## 2020-05-14 MED ORDER — NITROFURANTOIN MONOHYD MACRO 100 MG PO CAPS
100.0000 mg | ORAL_CAPSULE | Freq: Once | ORAL | Status: AC
  Administered 2020-05-14: 100 mg via ORAL
  Filled 2020-05-14: qty 1

## 2020-05-14 MED ORDER — SODIUM CHLORIDE 0.9% FLUSH: 3.0000 mL | Freq: Once | INTRAVENOUS | Status: DC

## 2020-05-14 MED ORDER — NITROFURANTOIN MONOHYD MACRO 100 MG PO CAPS: 100.0000 mg | ORAL_CAPSULE | Freq: Two times a day (BID) | ORAL | 0 refills | Status: AC

## 2020-05-14 MED ORDER — IOHEXOL 300 MG/ML  SOLN
75.0000 mL | Freq: Once | INTRAMUSCULAR | Status: AC | PRN
  Administered 2020-05-14: 75 mL via INTRAVENOUS

## 2020-05-14 MED ORDER — SODIUM CHLORIDE 0.9 % IV BOLUS
500.0000 mL | Freq: Once | INTRAVENOUS | Status: AC
  Administered 2020-05-14: 500 mL via INTRAVENOUS

## 2020-05-14 MED ORDER — ACETAMINOPHEN 160 MG/5ML PO SOLN
650.0000 mg | Freq: Once | ORAL | Status: AC
  Administered 2020-05-14: 650 mg via ORAL
  Filled 2020-05-14: qty 20.3

## 2020-05-14 NOTE — ED Triage Notes (Signed)
Pt to ED via POV c/o constipation and upper abdominal pain. Pt states that she has not been able to have a good BM in about 4 days. Pt states that she has tried OTC medication without relief. Pt is in NAD.

## 2020-05-14 NOTE — ED Notes (Signed)
Pt assisted to toilet to urinate 

## 2020-05-14 NOTE — ED Triage Notes (Signed)
Pt in via [facility] with c/o with abd pain and constipation for 4 days.

## 2020-05-14 NOTE — ED Provider Notes (Addendum)
[facility] Emergency Department Provider Note   ____________________________________________   First MD Initiated Contact with Patient 05/14/20 1657     (approximate)  I have reviewed the triage vital signs and the nursing notes.   HISTORY  Chief Complaint Constipation and Abdominal Pain    HPI [name] is a 86 y.o. female here for evaluation of abdominal pain  Patient reports has been feeling a constipated feeling, discomfort in her lower abdomen for the last 3 to 4 days.   Has been accompanied by a sense of nausea.  Occasionally a little bit of upper abdominal pain as well. Feels constipated.  Family check on her, I am felt she need to come to the ER if she is continued to note discomfort and pain for the last 4 days  Patient does relate a burning feeling with urination over the last few days as well.  No back pain.  No chest pain no trouble breathing.  No fevers or chills.  No vomiting  Last bowel movement about 3 to 4 days ago but passing gas normally.  Has tried some laxatives without any relief  Denies pain in the buttock or rectal region  She is urinated few times today, reports that she is not having any trouble emptying the bladder but is having a burning feeling with urination.  Urinating a normal amount, not a small amount  Past Medical History:  Diagnosis Date  . Anxiety   . Barrett esophagus   . Basal cell carcinoma   . Diplopia    history of,hospital 2/26-2/27/10  . Diverticulitis    colonoscopy 1995.  CT 01/31/09, 02/02/09  . Hiatal hernia   . Hiatal hernia    EGD 1998, 2007 ( Dr. Sharlett Iles)  . Hyperlipemia   . Hypertension   . Hypothyroidism   . Osteoporosis DXA 2015  . Personal history of colonic polyps    colonoscpy, removed 1995  . Thyroid carcinoma ([place]) 01/1994  . UTI (urinary tract infection)    Chronic    Patient Active Problem List   Diagnosis Date Noted  . Vitamin D deficiency 05/11/2019  . Impacted  stool in rectum ([place]) 03/06/2019  . Skin tear of forearm without complication 98/92/1194  . Encounter for chronic pain management 11/14/2017  . Health care maintenance 09/26/2017  . Dysuria 07/23/2017  . Near syncope 03/18/2017  . Throat pain 11/03/2016  . Weight loss 09/20/2016  . Insomnia 03/10/2015  . Osteoporosis 11/01/2014  . Medicare annual wellness visit, subsequent 08/25/2014  . Advance care planning 08/25/2014  . Anxiety state 10/23/2012  . IBS (irritable bowel syndrome) 04/18/2011  . GERD (gastroesophageal reflux disease) 04/18/2011  . Barrett esophagus 04/18/2011  . MULTIPLE CRANIAL NERVE PALSIES 01/29/2009  . CONVERGENCE INSUFF/PALSY BINOCULAR EYE MOVEMENT 01/22/2009  . KERATOACANTHOMA 11/18/2007  . Hypothyroidism 02/15/2007  . HLD (hyperlipidemia) 02/15/2007  . Essential hypertension 02/15/2007  . MITRAL VALVE PROLAPSE 02/15/2007  . ATRIAL FIBRILLATION, PAROXYSMAL 02/15/2007  . DIVERTICULOSIS, COLON 02/15/2007  . POSTMENOPAUSAL STATUS 02/15/2007  . COLONIC POLYPS, HX OF 02/15/2007  . DISORDER, DEPRESSIVE NEC 05/27/1998  . THYROID CANCER, HX OF 02/25/1994    Past Surgical History:  Procedure Laterality Date  . ABDOMINAL HYSTERECTOMY    . CHOLECYSTECTOMY    . HIP SURGERY  08/1993   due to fracture of right hip  . SKIN CANCER EXCISION     basal cell left hand, forehead  . THYROIDECTOMY  04/1994   thyroid cancer    Prior  to Admission medications   Medication Sig Start Date End Date Taking? Authorizing Provider  Artificial Saliva (BIOTENE DRY MOUTH MOISTURIZING) SOLN Use as directed in the mouth or throat.   Yes [provider]  ascorbic acid (VITAMIN C) 500 MG tablet Take 500 mg by mouth daily.   Yes [provider]  Cholecalciferol ([employer] D3) 25 MCG (1000 UT) capsule Take 1,000 Units by mouth daily.   Yes [provider]  clidinium-chlordiazePOXIDE ([name]) 5-2.5 MG capsule TAKE 1 CAPSULE BY MOUTH EVERY DAY 02/25/20  Yes [name], MD  conjugated estrogens (PREMARIN) vaginal cream Place 1 Applicatorful vaginally daily. Apply 0.5mg  (pea-sized amount)  just inside the vaginal introitus with a finger-tip every night for two weeks and then Monday, Wednesday and Friday nights. 09/11/17  Yes McGowan, Shannon A, PA-C  ENSURE PLUS (ENSURE PLUS) LIQD Take 237 mLs by mouth.   Yes [provider]  HYDROcodone-acetaminophen (NORCO/VICODIN) 5-325 MG tablet Take 1 tablet by mouth 2 (two) times daily. 04/22/20  Yes [name], MD  levothyroxine (SYNTHROID) 88 MCG tablet TAKE 1 TABLET BY MOUTH ONCE A DAY. TAKE ON AN EMPTY STOMACH WITH A GLASS OF WATER AT LEAST 30-60 MIN BEFORE BREAKFAST 10/28/19  Yes [name], MD  lisinopril (ZESTRIL) 10 MG tablet TAKE 1 TABLET BY MOUTH DAILY AT BEDTIME 03/25/20  Yes [name], MD  loratadine ([employer]S ALLERGY RELIEF) 10 MG dissolvable tablet Take 10 mg by mouth daily as needed for allergies.    Yes [provider]  LORazepam (ATIVAN) 0.5 MG tablet TAKE 1/2 TO 1 TABLET BY MOUTH TWICE DAILY AS NEEDED FOR ANXIETY 04/30/20  Yes [name], MD  mirtazapine (REMERON) 15 MG tablet TAKE ONE TABLET BY MOUTH AT BEDTIME 01/02/20  Yes [name], MD  NON FORMULARY Nutraflora  Probiotic   Yes [provider]  omeprazole (PRILOSEC) 40 MG capsule TAKE 1 CAPSULE BY MOUTH EVERY MORNING 45MINUTES BEFORE BREAKFAST 05/03/20  Yes [name], MD  polyethylene glycol powder (MIRALAX) 17 GM/SCOOP powder Take 17 g by mouth 2 (two) times daily as needed. 03/06/19  Yes [name], MD  nitrofurantoin, macrocrystal-monohydrate, (MACROBID) 100 MG capsule Take 1 capsule (100 mg total) by mouth 2 (two) times daily for 7 days. 05/14/20 05/21/20  Delman Kitten, MD  ondansetron (ZOFRAN ODT) 4 MG disintegrating tablet Take 1 tablet (4 mg total) by mouth every 6 (six) hours as needed for nausea or vomiting. 05/14/20   Delman Kitten, MD    Allergies Bentyl [dicyclomine hcl], Ciprofloxacin,  Cortisone acetate, Doxycycline, Gabapentin, Metoclopramide hcl, Penicillins, Promethazine hcl, Remeron [mirtazapine], and Septra [sulfamethoxazole-trimethoprim]  Family History  Problem Relation Age of Onset  . Cancer Brother        Pharyngeal   . Heart disease Brother        MI  . Diabetes Brother   . Diabetes Sister        amputation  . Hypertension Mother   . Stroke Father   . Kidney cancer Neg Hx   . Bladder Cancer Neg Hx     Social History Social History   Tobacco Use  . Smoking status: Never Smoker  . Smokeless tobacco: Never Used  Substance Use Topics  . Alcohol use: No    Alcohol/week: 0.0 standard drinks  . Drug use: No    Review of Systems Constitutional: No fever/chills Eyes: No visual changes. ENT: No sore throat. Cardiovascular: Denies chest pain. Respiratory: Denies shortness of breath.  Gastrointestinal: See HPI Genitourinary: Some pain and burning with urination the last few days Musculoskeletal: Negative for back pain. Skin: Negative for rash. Neurological: Negative for headaches, areas of focal weakness or numbness.    ____________________________________________   PHYSICAL EXAM:  VITAL SIGNS: ED Triage Vitals  Enc Vitals Group     BP 05/14/20 0941 (!) 169/95     Pulse Rate 05/14/20 1654 99     Resp 05/14/20 0941 16     Temp 05/14/20 0941 98.2 F (36.8 C)     Temp Source 05/14/20 0941 Oral     SpO2 05/14/20 0941 100 %     Weight 05/14/20 0942 95 lb (43.1 kg)     Height 05/14/20 0942 5\' 2"  (1.575 m)     Head Circumference --      Peak Flow --      Pain Score 05/14/20 0942 9     Pain Loc --      Pain Edu? --      Excl. in [place]? --     Constitutional: Alert and oriented. Well appearing and in no acute distress.  She is very pleasant, accompanied by a friend. Eyes: Conjunctivae are normal. Head: Atraumatic. Nose: No congestion/rhinnorhea. Mouth/Throat: Mucous membranes are moist. Neck: No stridor.  Cardiovascular: Normal rate,  regular rhythm. Grossly normal heart sounds.  Good peripheral circulation. Respiratory: Normal respiratory effort.  No retractions. Lungs CTAB. Gastrointestinal: Soft and nontender except moderate tenderness in mid to suprapubic region, mild tenderness left lower quadrant.  No rebound guarding or distention in any quadrant.  Normal bowel sounds.  No CVA tenderness. No distention. Musculoskeletal: No lower extremity tenderness nor edema. Neurologic:  Normal speech and language. No gross focal neurologic deficits are appreciated.  Skin:  Skin is warm, dry and intact. No rash noted. Psychiatric: Mood and affect are normal. Speech and behavior are normal.  Rectal exam performed with nurse Lexi-normal exam.  Nontender.  No stool in the rectal vault.  No black or bloody stool. ____________________________________________   LABS (all labs ordered are listed, but only abnormal results are displayed)  Labs Reviewed  COMPREHENSIVE METABOLIC PANEL - Abnormal; Notable for the following components:      Result Value   Glucose, Bld 111 (*)    Total Protein 8.3 (*)    All other components within normal limits  URINALYSIS, COMPLETE (UACMP) WITH MICROSCOPIC - Abnormal; Notable for the following components:   Color, Urine YELLOW (*)    APPearance CLOUDY (*)    Ketones, ur 5 (*)    Leukocytes,Ua LARGE (*)    WBC, UA >50 (*)    Bacteria, UA RARE (*)    All other components within normal limits  URINE CULTURE  LIPASE, BLOOD  CBC   ____________________________________________  EKG  Reviewed entered by me at 10 AM Heart rate 95 QRS 89 QTc 469 normal sinus rhythm, mild artifact.  Minimal nonspecific T wave abnormality. ____________________________________________  RADIOLOGY  CT ABDOMEN PELVIS W CONTRAST  Result Date: 05/14/2020 CLINICAL DATA:  Abdominal pain left lower quadrant with constipation 4 days. EXAM: CT ABDOMEN AND PELVIS WITH CONTRAST TECHNIQUE: Multidetector CT imaging of the abdomen  and pelvis was performed using the standard protocol following bolus administration of intravenous contrast. CONTRAST:  75mL OMNIPAQUE IOHEXOL 300 MG/ML  SOLN COMPARISON:  12/16/2018 FINDINGS: Lower chest: Lung bases are normal. Hepatobiliary: Previous cholecystectomy. Liver and biliary tree are unremarkable. Pancreas: Normal. Spleen: Normal. Adrenals/Urinary Tract: Adrenal glands are normal. Kidneys are normal in size without  hydronephrosis. There is a subcentimeter hypodensity over the right mid to lower pole cortex too small to characterize but likely a cyst and unchanged. Ureters are within normal. There is moderate bladder distention without significant change from the prior exam. Stomach/Bowel: Stomach and small bowel are normal. Appendix is not well visualized. Mild fecal retention throughout the colon which is otherwise unremarkable. Vascular/Lymphatic: Mild calcified plaque over the abdominal aorta which is normal in caliber. No evidence of adenopathy. Reproductive: Evidence of previous hysterectomy. Other: No free fluid or focal inflammatory change. Musculoskeletal: Orthopedic screws over the right femoral neck intact. Degenerative changes of the spine with multilevel disc disease over the lumbar spine. Mild grade 1 anterolisthesis of L4 on L5. IMPRESSION: 1.  No acute findings in the abdomen/pelvis. 2. Subcentimeter hypodensity over the mid to lower pole cortex of the right kidney too small to characterize but likely a cyst and unchanged. 3.  Aortic Atherosclerosis (ICD10-I70.0). 4. Stable bladder distension which may be due to a degree of bladder outlet obstruction. Recommend clinical correlation. Electronically Signed   By: Marin Olp M.D.   On: 05/14/2020 18:58    CT imaging reviewed.  No acute findings denoted.  Unchanged probable cyst right kidney  Some stable bladder distention is noted. (Of note the patient reports that she is emptying her bladder normally but is having pain and burning  with urination but not have any trouble emptying) ____________________________________________   PROCEDURES  Procedure(s) performed: None  Procedures  Critical Care performed: No  ____________________________________________   INITIAL IMPRESSION / ASSESSMENT AND PLAN / ED COURSE  Pertinent labs & imaging results that were available during my care of the patient were reviewed by me and considered in my medical decision making (see chart for details).   Differential diagnosis includes but is not limited to, abdominal perforation, aortic dissection, cholecystitis, appendicitis, diverticulitis, colitis, esophagitis/gastritis, kidney stone, pyelonephritis, urinary tract infection, aortic aneurysm. All are considered in decision and treatment plan. Based upon the patient's presentation and risk factors, patient reports a constipated feeling with lower abdominal discomfort but also having urinary symptoms including dysuria for the last 3 to 4 days.  Suspect possible urinary tract infection and given her urinalysis seems a strong possibility, no signs or symptoms of pyelonephritis. Ct no signs of acute disease.  Reviewed medication allergies, patient has intolerance for fluoroquinolones, cannot take penicillins reports a severe reaction that "almost killed her", does not tolerate doxycycline and also difficulty with Bactrim.  She has used Macrobid it appears from records with good success in the past.  We will treat with Macrobid, discussed with the patient careful return precautions, follow-up recommendations, and family and friends will continue to check on her.  She is awake alert oriented peers and without acute distress.  Appropriate for outpatient treatment  Return precautions and treatment recommendations and follow-up discussed with the patient who is agreeable with the plan.          ____________________________________________   FINAL CLINICAL IMPRESSION(S) / ED  DIAGNOSES  Final diagnoses:  Lower urinary tract infection, acute        Note:  This document was prepared using Dragon voice recognition software and may include unintentional dictation errors       Delman Kitten, MD 05/14/20 1933    Delman Kitten, MD 05/14/20 1934

## 2020-05-14 NOTE — ED Notes (Signed)
Pt c/o constipation X4 days and LLQ abdominal pain. Pt denies emesis, c/o intermittent nausea and lightheadedness when standing. Pt states she sips on fluids but is unable to eat or drink much due to "throat feels scratchy and like food is catching." Pt is AOX4, NAD noted. Pt denies urge to defecate.

## 2020-05-14 NOTE — Discharge Instructions (Signed)
You have been seen in the Emergency Department (ED) today for pain when urinating.  Your workup today suggests that you have a urinary tract infection (UTI). ° ° °Call your regular doctor to schedule the next available appointment to follow up on today’s ED visit, or return immediately to the ED if your pain worsens, you have decreased urine production, develop fever, persistent vomiting, or other symptoms that concern you. ° °

## 2020-05-17 ENCOUNTER — Telehealth: Payer: Self-pay

## 2020-05-17 NOTE — Telephone Encounter (Signed)
[place] Night - Client Nonclinical Telephone Record AccessNurse Client [place] Night - Client Client Site [facility] [place] Physician Renford Dills - MD Contact Type Call Who Is Calling Patient / Member / Family / Caregiver Caller Name Kaysen Sefcik Caller Phone Number [phone] Patient Name [name] Patient DOB Jun 28, 1930 Call Type Message Only Information Provided Reason for Call Request to Schedule Office Appointment Initial Comment Caller states she went to the ER Friday about her kidneys. She needs to schedule a follow up appointment. Additional Comment Advised caller to call back. Hours provided. Declined triage. Disp. Time Disposition Final User 05/17/2020 8:02:06 AM General Information Provided Yes Phillips Hay Call Closed By: Phillips Hay Transaction Date/Time: 05/17/2020 7:57:12 AM (ET)

## 2020-05-18 LAB — URINE CULTURE: Culture: >=100000 — AB

## 2020-05-21 NOTE — Telephone Encounter (Signed)
Called patient and got her scheduled for a virtual hospital follow up. She is having some abdominal pain, vomiting, fatigue, weakness, ect. Which she is very sure its caused from a medication switch but would love to still have the virtual and talk to [name] to see exactly what he would like her to do.

## 2020-05-21 NOTE — Telephone Encounter (Signed)
Noted. Thanks.  Will d/w pt at visit.

## 2020-05-24 ENCOUNTER — Encounter: Payer: Self-pay | Admitting: Emergency Medicine

## 2020-05-24 DIAGNOSIS — I700 Atherosclerosis of aorta: Secondary | ICD-10-CM | POA: Diagnosis not present

## 2020-05-24 DIAGNOSIS — I10 Essential (primary) hypertension: Secondary | ICD-10-CM | POA: Diagnosis present

## 2020-05-24 DIAGNOSIS — H9190 Unspecified hearing loss, unspecified ear: Secondary | ICD-10-CM | POA: Diagnosis present

## 2020-05-24 DIAGNOSIS — Z79890 Hormone replacement therapy (postmenopausal): Secondary | ICD-10-CM

## 2020-05-24 DIAGNOSIS — M47816 Spondylosis without myelopathy or radiculopathy, lumbar region: Secondary | ICD-10-CM | POA: Diagnosis not present

## 2020-05-24 DIAGNOSIS — N3289 Other specified disorders of bladder: Secondary | ICD-10-CM | POA: Diagnosis not present

## 2020-05-24 DIAGNOSIS — Z87440 Personal history of urinary (tract) infections: Secondary | ICD-10-CM

## 2020-05-24 DIAGNOSIS — Z8719 Personal history of other diseases of the digestive system: Secondary | ICD-10-CM

## 2020-05-24 DIAGNOSIS — N390 Urinary tract infection, site not specified: Secondary | ICD-10-CM | POA: Diagnosis not present

## 2020-05-24 DIAGNOSIS — Z9049 Acquired absence of other specified parts of digestive tract: Secondary | ICD-10-CM

## 2020-05-24 DIAGNOSIS — Z8249 Family history of ischemic heart disease and other diseases of the circulatory system: Secondary | ICD-10-CM

## 2020-05-24 DIAGNOSIS — Z90710 Acquired absence of both cervix and uterus: Secondary | ICD-10-CM

## 2020-05-24 DIAGNOSIS — Z79899 Other long term (current) drug therapy: Secondary | ICD-10-CM

## 2020-05-24 DIAGNOSIS — Z20822 Contact with and (suspected) exposure to covid-19: Secondary | ICD-10-CM | POA: Diagnosis present

## 2020-05-24 DIAGNOSIS — Z823 Family history of stroke: Secondary | ICD-10-CM

## 2020-05-24 DIAGNOSIS — K219 Gastro-esophageal reflux disease without esophagitis: Secondary | ICD-10-CM | POA: Diagnosis present

## 2020-05-24 DIAGNOSIS — K8689 Other specified diseases of pancreas: Secondary | ICD-10-CM | POA: Diagnosis not present

## 2020-05-24 DIAGNOSIS — R64 Cachexia: Secondary | ICD-10-CM | POA: Diagnosis present

## 2020-05-24 DIAGNOSIS — E890 Postprocedural hypothyroidism: Secondary | ICD-10-CM | POA: Diagnosis present

## 2020-05-24 DIAGNOSIS — F418 Other specified anxiety disorders: Secondary | ICD-10-CM | POA: Diagnosis present

## 2020-05-24 DIAGNOSIS — K5900 Constipation, unspecified: Secondary | ICD-10-CM | POA: Diagnosis present

## 2020-05-24 DIAGNOSIS — N3000 Acute cystitis without hematuria: Principal | ICD-10-CM | POA: Diagnosis present

## 2020-05-24 DIAGNOSIS — R627 Adult failure to thrive: Secondary | ICD-10-CM | POA: Diagnosis present

## 2020-05-24 DIAGNOSIS — Z889 Allergy status to unspecified drugs, medicaments and biological substances status: Secondary | ICD-10-CM

## 2020-05-24 DIAGNOSIS — Z85850 Personal history of malignant neoplasm of thyroid: Secondary | ICD-10-CM

## 2020-05-24 DIAGNOSIS — K2270 Barrett's esophagus without dysplasia: Secondary | ICD-10-CM | POA: Diagnosis present

## 2020-05-24 DIAGNOSIS — R682 Dry mouth, unspecified: Secondary | ICD-10-CM | POA: Diagnosis present

## 2020-05-24 DIAGNOSIS — Z681 Body mass index (BMI) 19 or less, adult: Secondary | ICD-10-CM

## 2020-05-24 DIAGNOSIS — Z85828 Personal history of other malignant neoplasm of skin: Secondary | ICD-10-CM

## 2020-05-24 DIAGNOSIS — E785 Hyperlipidemia, unspecified: Secondary | ICD-10-CM | POA: Diagnosis present

## 2020-05-24 LAB — CBC WITH DIFFERENTIAL/PLATELET
Abs Immature Granulocytes: 0.01 10*3/uL (ref 0.00–0.07)
Basophils Absolute: 0 10*3/uL (ref 0.0–0.1)
Basophils Relative: 0 %
Eosinophils Absolute: 0.1 10*3/uL (ref 0.0–0.5)
Eosinophils Relative: 2 %
HCT: 31.6 % — ABNORMAL LOW (ref 36.0–46.0)
Hemoglobin: 11.2 g/dL — ABNORMAL LOW (ref 12.0–15.0)
Immature Granulocytes: 0 %
Lymphocytes Relative: 19 %
Lymphs Abs: 1.2 10*3/uL (ref 0.7–4.0)
MCH: 31.5 pg (ref 26.0–34.0)
MCHC: 35.4 g/dL (ref 30.0–36.0)
MCV: 88.8 fL (ref 80.0–100.0)
Monocytes Absolute: 0.5 10*3/uL (ref 0.1–1.0)
Monocytes Relative: 8 %
Neutro Abs: 4.4 10*3/uL (ref 1.7–7.7)
Neutrophils Relative %: 71 %
Platelets: 183 10*3/uL (ref 150–400)
RBC: 3.56 MIL/uL — ABNORMAL LOW (ref 3.87–5.11)
RDW: 12.5 % (ref 11.5–15.5)
WBC: 6.3 10*3/uL (ref 4.0–10.5)
nRBC: 0 % (ref 0.0–0.2)

## 2020-05-24 LAB — COMPREHENSIVE METABOLIC PANEL
ALT: 11 U/L (ref 0–44)
AST: 22 U/L (ref 15–41)
Albumin: 3.8 g/dL (ref 3.5–5.0)
Alkaline Phosphatase: 57 U/L (ref 38–126)
Anion gap: 10 (ref 5–15)
BUN: 15 mg/dL (ref 8–23)
CO2: 28 mmol/L (ref 22–32)
Calcium: 8.5 mg/dL — ABNORMAL LOW (ref 8.9–10.3)
Chloride: 95 mmol/L — ABNORMAL LOW (ref 98–111)
Creatinine, Ser: 0.9 mg/dL (ref 0.44–1.00)
GFR calc Af Amer: >60 mL/min (ref >60)
GFR calc non Af Amer: 57 mL/min — ABNORMAL LOW (ref >60)
Glucose, Bld: 111 mg/dL — ABNORMAL HIGH (ref 70–99)
Potassium: 4.4 mmol/L (ref 3.5–5.1)
Sodium: 133 mmol/L — ABNORMAL LOW (ref 135–145)
Total Bilirubin: 0.8 mg/dL (ref 0.3–1.2)
Total Protein: 6.9 g/dL (ref 6.5–8.1)

## 2020-05-24 LAB — LIPASE, BLOOD: Lipase: 22 U/L (ref 11–51)

## 2020-05-24 NOTE — ED Triage Notes (Addendum)
Pt brought in with caregiver who reports she was seen and diagnosed with UTI on 6/18. Pt brought to ED due to failure to thrive by consuming fluids and solids and increased pain in lower abdomen.

## 2020-05-25 ENCOUNTER — Other Ambulatory Visit: Payer: Self-pay

## 2020-05-25 ENCOUNTER — Emergency Department: Payer: Medicare Other

## 2020-05-25 ENCOUNTER — Inpatient Hospital Stay
Admission: EM | Admit: 2020-05-25 | Discharge: 2020-05-27 | DRG: 690 | Disposition: A | Discharge status: Home Health | Payer: Medicare Other | Attending: Internal Medicine | Admitting: Internal Medicine

## 2020-05-25 ENCOUNTER — Encounter: Payer: Self-pay | Admitting: Emergency Medicine

## 2020-05-25 DIAGNOSIS — R112 Nausea with vomiting, unspecified: Secondary | ICD-10-CM

## 2020-05-25 DIAGNOSIS — K219 Gastro-esophageal reflux disease without esophagitis: Secondary | ICD-10-CM | POA: Diagnosis present

## 2020-05-25 DIAGNOSIS — E039 Hypothyroidism, unspecified: Secondary | ICD-10-CM | POA: Diagnosis present

## 2020-05-25 DIAGNOSIS — N3000 Acute cystitis without hematuria: Secondary | ICD-10-CM | POA: Diagnosis present

## 2020-05-25 DIAGNOSIS — M47816 Spondylosis without myelopathy or radiculopathy, lumbar region: Secondary | ICD-10-CM | POA: Diagnosis not present

## 2020-05-25 DIAGNOSIS — F32A Depression, unspecified: Secondary | ICD-10-CM

## 2020-05-25 DIAGNOSIS — K8689 Other specified diseases of pancreas: Secondary | ICD-10-CM | POA: Diagnosis not present

## 2020-05-25 DIAGNOSIS — F329 Major depressive disorder, single episode, unspecified: Secondary | ICD-10-CM | POA: Diagnosis not present

## 2020-05-25 DIAGNOSIS — I700 Atherosclerosis of aorta: Secondary | ICD-10-CM | POA: Diagnosis not present

## 2020-05-25 DIAGNOSIS — I10 Essential (primary) hypertension: Secondary | ICD-10-CM | POA: Diagnosis present

## 2020-05-25 DIAGNOSIS — N390 Urinary tract infection, site not specified: Secondary | ICD-10-CM | POA: Diagnosis present

## 2020-05-25 DIAGNOSIS — R1032 Left lower quadrant pain: Secondary | ICD-10-CM

## 2020-05-25 DIAGNOSIS — N3289 Other specified disorders of bladder: Secondary | ICD-10-CM | POA: Diagnosis not present

## 2020-05-25 DIAGNOSIS — R627 Adult failure to thrive: Secondary | ICD-10-CM

## 2020-05-25 HISTORY — DX: Unspecified hearing loss, unspecified ear: H91.90

## 2020-05-25 LAB — URINALYSIS, COMPLETE (UACMP) WITH MICROSCOPIC
Bilirubin Urine: NEGATIVE
Glucose, UA: NEGATIVE mg/dL
Hgb urine dipstick: NEGATIVE
Ketones, ur: NEGATIVE mg/dL
Nitrite: POSITIVE — AB
Protein, ur: NEGATIVE mg/dL
Specific Gravity, Urine: 1.005 (ref 1.005–1.030)
WBC, UA: >50 WBC/hpf — ABNORMAL HIGH (ref 0–5)
pH: 7 (ref 5.0–8.0)

## 2020-05-25 LAB — CBC
HCT: 37.8 % (ref 36.0–46.0)
Hemoglobin: 13.3 g/dL (ref 12.0–15.0)
MCH: 31.5 pg (ref 26.0–34.0)
MCHC: 35.2 g/dL (ref 30.0–36.0)
MCV: 89.6 fL (ref 80.0–100.0)
Platelets: 192 10*3/uL (ref 150–400)
RBC: 4.22 MIL/uL (ref 3.87–5.11)
RDW: 12.6 % (ref 11.5–15.5)
WBC: 6.6 10*3/uL (ref 4.0–10.5)
nRBC: 0 % (ref 0.0–0.2)

## 2020-05-25 LAB — CREATININE, SERUM
Creatinine, Ser: 0.82 mg/dL (ref 0.44–1.00)
GFR calc Af Amer: >60 mL/min (ref >60)
GFR calc non Af Amer: >60 mL/min (ref >60)

## 2020-05-25 LAB — LACTIC ACID, PLASMA: Lactic Acid, Venous: 0.9 mmol/L (ref 0.5–1.9)

## 2020-05-25 LAB — SARS CORONAVIRUS 2 BY RT PCR (HOSPITAL ORDER, PERFORMED IN ~~LOC~~ HOSPITAL LAB): SARS Coronavirus 2: NEGATIVE

## 2020-05-25 LAB — PROCALCITONIN: Procalcitonin: <0.1 ng/mL

## 2020-05-25 MED ORDER — SACCHAROMYCES BOULARDII 250 MG PO CAPS
250.0000 mg | ORAL_CAPSULE | Freq: Two times a day (BID) | ORAL | Status: DC
  Administered 2020-05-25 – 2020-05-27 (×5): 250 mg via ORAL
  Filled 2020-05-25 (×6): qty 1

## 2020-05-25 MED ORDER — SODIUM CHLORIDE 0.9 % IV SOLN
2.0000 g | Freq: Once | INTRAVENOUS | Status: AC
  Administered 2020-05-25: 2 g via INTRAVENOUS

## 2020-05-25 MED ORDER — ONDANSETRON HCL 4 MG PO TABS: 4.0000 mg | ORAL_TABLET | Freq: Four times a day (QID) | ORAL | Status: DC | PRN

## 2020-05-25 MED ORDER — SODIUM CHLORIDE 0.9 % IV SOLN
1.0000 g | INTRAVENOUS | Status: DC
  Administered 2020-05-25 – 2020-05-27 (×3): 1 g via INTRAVENOUS
  Filled 2020-05-25 (×3): qty 1
  Filled 2020-05-25: qty 10

## 2020-05-25 MED ORDER — ACETAMINOPHEN 650 MG RE SUPP: 650.0000 mg | Freq: Four times a day (QID) | RECTAL | Status: DC | PRN

## 2020-05-25 MED ORDER — CILIDINIUM-CHLORDIAZEPOXIDE 2.5-5 MG PO CAPS
1.0000 | ORAL_CAPSULE | Freq: Every day | ORAL | Status: DC
  Filled 2020-05-25 (×4): qty 1

## 2020-05-25 MED ORDER — SODIUM CHLORIDE 0.9 % IV SOLN
2.0000 g | Freq: Two times a day (BID) | INTRAVENOUS | Status: DC
  Filled 2020-05-25: qty 2

## 2020-05-25 MED ORDER — ONDANSETRON HCL 4 MG/2ML IJ SOLN: 4.0000 mg | Freq: Four times a day (QID) | INTRAMUSCULAR | Status: DC | PRN

## 2020-05-25 MED ORDER — LISINOPRIL 10 MG PO TABS
10.0000 mg | ORAL_TABLET | Freq: Every day | ORAL | Status: DC
  Administered 2020-05-25 – 2020-05-26 (×2): 10 mg via ORAL
  Filled 2020-05-25 (×2): qty 1

## 2020-05-25 MED ORDER — SODIUM CHLORIDE 0.9 % IV SOLN: INTRAVENOUS | Status: AC

## 2020-05-25 MED ORDER — POLYETHYLENE GLYCOL 3350 17 G PO PACK
17.0000 g | PACK | Freq: Two times a day (BID) | ORAL | Status: DC | PRN
  Administered 2020-05-26: 09:00:00 17 g via ORAL
  Filled 2020-05-25: qty 1

## 2020-05-25 MED ORDER — SODIUM CHLORIDE 0.9 % IV BOLUS
500.0000 mL | Freq: Once | INTRAVENOUS | Status: AC
  Administered 2020-05-25: 500 mL via INTRAVENOUS

## 2020-05-25 MED ORDER — LORATADINE 10 MG PO TABS: 10.0000 mg | ORAL_TABLET | Freq: Every day | ORAL | Status: DC | PRN

## 2020-05-25 MED ORDER — PANTOPRAZOLE SODIUM 40 MG PO TBEC
40.0000 mg | DELAYED_RELEASE_TABLET | Freq: Every day | ORAL | Status: DC
  Administered 2020-05-25 – 2020-05-27 (×3): 40 mg via ORAL
  Filled 2020-05-25 (×3): qty 1

## 2020-05-25 MED ORDER — ACETAMINOPHEN 325 MG PO TABS
650.0000 mg | ORAL_TABLET | Freq: Four times a day (QID) | ORAL | Status: DC | PRN
  Administered 2020-05-26: 650 mg via ORAL
  Filled 2020-05-25: qty 2

## 2020-05-25 MED ORDER — VITAMIN D 25 MCG (1000 UNIT) PO TABS
1000.0000 [IU] | ORAL_TABLET | Freq: Every day | ORAL | Status: DC
  Administered 2020-05-25 – 2020-05-27 (×3): 1000 [IU] via ORAL
  Filled 2020-05-25 (×3): qty 1

## 2020-05-25 MED ORDER — ENOXAPARIN SODIUM 30 MG/0.3ML ~~LOC~~ SOLN
30.0000 mg | SUBCUTANEOUS | Status: DC
  Administered 2020-05-25 – 2020-05-27 (×3): 30 mg via SUBCUTANEOUS
  Filled 2020-05-25 (×4): qty 0.3

## 2020-05-25 MED ORDER — HYDROCODONE-ACETAMINOPHEN 5-325 MG PO TABS
1.0000 | ORAL_TABLET | ORAL | Status: DC | PRN
  Administered 2020-05-25: 1 via ORAL
  Filled 2020-05-25: qty 1

## 2020-05-25 MED ORDER — LEVOTHYROXINE SODIUM 88 MCG PO TABS
88.0000 ug | ORAL_TABLET | Freq: Every day | ORAL | Status: DC
  Administered 2020-05-25 – 2020-05-27 (×3): 88 ug via ORAL
  Filled 2020-05-25 (×4): qty 1

## 2020-05-25 MED ORDER — ONDANSETRON HCL 4 MG/2ML IJ SOLN
4.0000 mg | INTRAMUSCULAR | Status: AC
  Administered 2020-05-25: 4 mg via INTRAVENOUS

## 2020-05-25 MED ORDER — IOHEXOL 300 MG/ML  SOLN
75.0000 mL | Freq: Once | INTRAMUSCULAR | Status: AC | PRN
  Administered 2020-05-25: 75 mL via INTRAVENOUS

## 2020-05-25 MED ORDER — ASCORBIC ACID 500 MG PO TABS
500.0000 mg | ORAL_TABLET | Freq: Every day | ORAL | Status: DC
  Administered 2020-05-25 – 2020-05-27 (×3): 500 mg via ORAL
  Filled 2020-05-25 (×3): qty 1

## 2020-05-25 MED ORDER — LORAZEPAM 0.5 MG PO TABS
0.2500 mg | ORAL_TABLET | Freq: Two times a day (BID) | ORAL | Status: DC | PRN
  Administered 2020-05-27: 02:00:00 0.25 mg via ORAL
  Filled 2020-05-25: qty 1

## 2020-05-25 MED ORDER — BOOST PLUS PO LIQD
237.0000 mL | Freq: Two times a day (BID) | ORAL | Status: DC
  Administered 2020-05-25: 237 mL via ORAL
  Filled 2020-05-25: qty 237

## 2020-05-25 MED ORDER — MIRTAZAPINE 15 MG PO TABS
15.0000 mg | ORAL_TABLET | Freq: Every day | ORAL | Status: DC
  Administered 2020-05-25 – 2020-05-26 (×2): 15 mg via ORAL
  Filled 2020-05-25 (×2): qty 1

## 2020-05-25 MED ORDER — ENOXAPARIN SODIUM 40 MG/0.4ML ~~LOC~~ SOLN
SUBCUTANEOUS | Status: AC
  Filled 2020-05-25: qty 0.4

## 2020-05-25 MED ORDER — SODIUM CHLORIDE 0.9 % IV SOLN
INTRAVENOUS | Status: AC
  Filled 2020-05-25: qty 2

## 2020-05-25 MED ORDER — ONDANSETRON HCL 4 MG/2ML IJ SOLN
INTRAMUSCULAR | Status: AC
  Filled 2020-05-25: qty 2

## 2020-05-25 NOTE — Care Management Obs Status (Signed)
[employer] NOTIFICATION   Patient Details  Name: [name] MRN: [id number] Date of Birth: [birth year]   Medicare Observation Status Notification Given:  Yes    Shelbie Hutching, RN 05/25/2020, 10:25 AM

## 2020-05-25 NOTE — ED Provider Notes (Signed)
[facility] Emergency Department Provider Note  ____________________________________________   First MD Initiated Contact with Patient 05/25/20 [phone]     (approximate)  I have reviewed the triage vital signs and the nursing notes.   HISTORY  Chief Complaint Abdominal Pain and Emesis    HPI  [name] is a 86 y.o. female with medical history as listed below who presents for evaluation  of persistent dysuria, worsening nausea and vomiting, and worsening left lower quadrant abdominal pain.  She was seen about 10 to 11 days ago in the emergency department and diagnosed with a urinary tract infection as well as with some degree at least of chronic urinary outlet obstruction.  She has a number of drug allergies listed in her record and is concerned about the possibility of having a reaction to antibiotics, so she was started on Macrobid which she has used successfully in the past.  She is here tonight with a caregiver who helps look after her but has no legal responsibility for her.  The caregiver and the patient reported that she has not been doing any better since the antibiotics and she completed the full course.  She has an appointment with her primary care provider in about 4 days but she needed to come in tonight due to an inability to eat or drink anything at home, worsening pain, persistent dysuria, etc.  The patient says that briefly the pain in her abdomen seem to get better after the antibiotics but it has come back and is worse.  It is a sharp pain that is worse when she pushes on it.  She also has burning when she urinates.  She denies fever, sore throat, chest pain, shortness of breath.  She has a hard time eating or drinking anything due to nausea and vomiting.  Overall her symptoms are moderate to severe and nothing in particular is helping.        Past Medical History:  Diagnosis Date  . Anxiety   . Barrett esophagus   . Basal cell  carcinoma   . Diplopia    history of,hospital 2/26-2/27/10  . Diverticulitis    colonoscopy 1995.  CT 01/31/09, 02/02/09  . Hard of hearing   . Hiatal hernia   . Hiatal hernia    EGD 1998, 2007 ( Dr. Sharlett Iles)  . Hyperlipemia   . Hypertension   . Hypothyroidism   . Osteoporosis DXA 2015  . Personal history of colonic polyps    colonoscpy, removed 1995  . Thyroid carcinoma ([place]) 01/1994  . UTI (urinary tract infection)    Chronic    Patient Active Problem List   Diagnosis Date Noted  . UTI (urinary tract infection) 05/25/2020  . Vitamin D deficiency 05/11/2019  . Impacted stool in rectum ([place]) 03/06/2019  . Skin tear of forearm without complication 00/86/7619  . Encounter for chronic pain management 11/14/2017  . Health care maintenance 09/26/2017  . Dysuria 07/23/2017  . Near syncope 03/18/2017  . Throat pain 11/03/2016  . Weight loss 09/20/2016  . Insomnia 03/10/2015  . Osteoporosis 11/01/2014  . Medicare annual wellness visit, subsequent 08/25/2014  . Advance care planning 08/25/2014  . Anxiety state 10/23/2012  . IBS (irritable bowel syndrome) 04/18/2011  . GERD (gastroesophageal reflux disease) 04/18/2011  . Barrett esophagus 04/18/2011  . MULTIPLE CRANIAL NERVE PALSIES 01/29/2009  . CONVERGENCE INSUFF/PALSY BINOCULAR EYE MOVEMENT 01/22/2009  . KERATOACANTHOMA 11/18/2007  . Hypothyroidism 02/15/2007  . HLD (hyperlipidemia) 02/15/2007  . Essential hypertension  02/15/2007  . MITRAL VALVE PROLAPSE 02/15/2007  . ATRIAL FIBRILLATION, PAROXYSMAL 02/15/2007  . DIVERTICULOSIS, COLON 02/15/2007  . POSTMENOPAUSAL STATUS 02/15/2007  . COLONIC POLYPS, HX OF 02/15/2007  . DISORDER, DEPRESSIVE NEC 05/27/1998  . THYROID CANCER, HX OF 02/25/1994    Past Surgical History:  Procedure Laterality Date  . ABDOMINAL HYSTERECTOMY    . CHOLECYSTECTOMY    . HIP SURGERY  08/1993   due to fracture of right hip  . SKIN CANCER EXCISION     basal cell left hand, forehead  .  THYROIDECTOMY  04/1994   thyroid cancer    Prior to Admission medications   Medication Sig Start Date End Date Taking? Authorizing Provider  Artificial Saliva (BIOTENE DRY MOUTH MOISTURIZING) SOLN Use as directed in the mouth or throat.   Yes [provider]  ascorbic acid (VITAMIN C) 500 MG tablet Take 500 mg by mouth daily.   Yes [provider]  Cholecalciferol ([employer] D3) 25 MCG (1000 UT) capsule Take 1,000 Units by mouth daily.   Yes [provider]  clidinium-chlordiazePOXIDE ([name]) 5-2.5 MG capsule TAKE 1 CAPSULE BY MOUTH EVERY DAY 02/25/20  Yes [name], MD  ENSURE PLUS (ENSURE PLUS) LIQD Take 237 mLs by mouth.   Yes [provider]  levothyroxine (SYNTHROID) 88 MCG tablet TAKE 1 TABLET BY MOUTH ONCE A DAY. TAKE ON AN EMPTY STOMACH WITH A GLASS OF WATER AT LEAST 30-60 MIN BEFORE BREAKFAST 10/28/19  Yes [name], MD  lisinopril (ZESTRIL) 10 MG tablet TAKE 1 TABLET BY MOUTH DAILY AT BEDTIME 03/25/20  Yes [name], MD  loratadine (CVS ALLERGY RELIEF) 10 MG dissolvable tablet Take 10 mg by mouth daily as needed for allergies.    Yes [provider]  LORazepam (ATIVAN) 0.5 MG tablet TAKE 1/2 TO 1 TABLET BY MOUTH TWICE DAILY AS NEEDED FOR ANXIETY 04/30/20  Yes [name], MD  mirtazapine (REMERON) 15 MG tablet TAKE ONE TABLET BY MOUTH AT BEDTIME 01/02/20  Yes [name], MD  NON FORMULARY Nutraflora  Probiotic   Yes [provider]  omeprazole (PRILOSEC) 40 MG capsule TAKE 1 CAPSULE BY MOUTH EVERY MORNING 45MINUTES BEFORE BREAKFAST 05/03/20  Yes [name], MD  polyethylene glycol powder (MIRALAX) 17 GM/SCOOP powder Take 17 g by mouth 2 (two) times daily as needed. 03/06/19  Yes [name], MD    Allergies Bentyl [dicyclomine hcl], Ciprofloxacin, Cortisone acetate, Doxycycline, Gabapentin, Metoclopramide hcl, Penicillins, Promethazine hcl, Remeron [mirtazapine], and Septra  [sulfamethoxazole-trimethoprim]  Family History  Problem Relation Age of Onset  . Cancer Brother        Pharyngeal   . Heart disease Brother        MI  . Diabetes Brother   . Diabetes Sister        amputation  . Hypertension Mother   . Stroke Father   . Kidney cancer Neg Hx   . Bladder Cancer Neg Hx     Social History Social History   Tobacco Use  . Smoking status: Never Smoker  . Smokeless tobacco: Never Used  Substance Use Topics  . Alcohol use: No    Alcohol/week: 0.0 standard drinks  . Drug use: No    Review of Systems Constitutional: No fever/chills.  General malaise and fatigue. Eyes: No visual changes. ENT: No sore throat. Cardiovascular: Denies chest pain. Respiratory: Denies shortness of breath. Gastrointestinal: Worsening left lower quadrant abdominal pain.  Persistent episodes of nausea and vomiting particular  when she tries to eat or drink anything. Genitourinary: +dysuria. Musculoskeletal: Negative for neck pain.  Negative for back pain. Integumentary: Negative for rash. Neurological: Negative for headaches, focal weakness or numbness.   ____________________________________________   PHYSICAL EXAM:  VITAL SIGNS: ED Triage Vitals [05/24/20 2157]  Enc Vitals Group     BP 139/74     Pulse Rate 88     Resp 18     Temp 98.6 F (37 C)     Temp Source Oral     SpO2 95 %     Weight      Height      Head Circumference      Peak Flow      Pain Score      Pain Loc      Pain Edu?      Excl. in [place]?     Constitutional: Alert and oriented.  Eyes: Conjunctivae are normal.  Head: Atraumatic. Nose: No congestion/rhinnorhea. Mouth/Throat: Patient is wearing a mask. Neck: No stridor.  No meningeal signs.   Cardiovascular: Normal rate, regular rhythm. Good peripheral circulation. Grossly normal heart sounds. Respiratory: Normal respiratory effort.  No retractions. Gastrointestinal: Cachectic body habitus.  Soft and nondistended.  Moderate to severe  tenderness to palpation of the suprapubic region and left lower quadrant.  Involuntary guarding is present, no obvious rebound tenderness. Musculoskeletal: No lower extremity tenderness nor edema. No gross deformities of extremities. Neurologic:  Normal speech and language. No gross focal neurologic deficits are appreciated other than the patient being chronically hard of hearing. Skin:  Skin is warm, dry and intact. Psychiatric: Mood and affect are normal. Speech and behavior are normal.  ____________________________________________   LABS (all labs ordered are listed, but only abnormal results are displayed)  Labs Reviewed  COMPREHENSIVE METABOLIC PANEL - Abnormal; Notable for the following components:      Result Value   Sodium 133 (*)    Chloride 95 (*)    Glucose, Bld 111 (*)    Calcium 8.5 (*)    GFR calc non Af Amer 57 (*)    All other components within normal limits  URINALYSIS, COMPLETE (UACMP) WITH MICROSCOPIC - Abnormal; Notable for the following components:   Color, Urine YELLOW (*)    APPearance CLOUDY (*)    Nitrite POSITIVE (*)    Leukocytes,Ua LARGE (*)    WBC, UA >50 (*)    Bacteria, UA RARE (*)    All other components within normal limits  CBC WITH DIFFERENTIAL/PLATELET - Abnormal; Notable for the following components:   RBC 3.56 (*)    Hemoglobin 11.2 (*)    HCT 31.6 (*)    All other components within normal limits  URINE CULTURE  SARS CORONAVIRUS 2 BY RT PCR (HOSPITAL ORDER, [address] [facility])  LIPASE, BLOOD  PROCALCITONIN  LACTIC ACID, PLASMA  CBC  CREATININE, SERUM   ____________________________________________  EKG  No indication for emergent EKG ____________________________________________  RADIOLOGY I, Hinda Kehr, personally viewed and evaluated these images (plain radiographs) as part of my medical decision making, as well as reviewing the written report by the radiologist.  ED MD interpretation:  No acute  intraabdominal abnormalities.  Official radiology report(s): CT ABDOMEN PELVIS W CONTRAST  Result Date: 05/25/2020 CLINICAL DATA:  Failure to thrive. EXAM: CT ABDOMEN AND PELVIS WITH CONTRAST TECHNIQUE: Multidetector CT imaging of the abdomen and pelvis was performed using the standard protocol following bolus administration of intravenous contrast. CONTRAST:  31mL OMNIPAQUE IOHEXOL 300  MG/ML  SOLN COMPARISON:  CT scan 05/14/2020 FINDINGS: Lower chest: The lung bases are clear of acute process. No pleural effusion or pulmonary lesions. The heart is normal in size. No pericardial effusion. The distal esophagus and aorta are unremarkable. Hepatobiliary: No worrisome hepatic lesions. Stable mild common bile duct dilatation due to a gin prior cholecystectomy. Pancreas: Moderate pancreatic atrophy. No mass or acute inflammation. Spleen: Normal size. No focal lesions. Adrenals/Urinary Tract: The adrenal glands and kidneys are unremarkable and stable. No worrisome renal lesions or hydroureteronephrosis. The bladder is slightly distended. No bladder mass or asymmetric bladder wall thickening. No bladder calculi. Stomach/Bowel: Moderate stool throughout the colon may suggest constipation. No findings for small bowel obstruction. Vascular/Lymphatic: Stable atherosclerotic calcifications involving the aorta and branch vessels but no aneurysm or dissection. The major venous structures are patent. No mesenteric or retroperitoneal mass or adenopathy. Reproductive: Surgically absent. Other: No pelvic mass or adenopathy. No free pelvic fluid collections. No inguinal mass or adenopathy. No abdominal wall hernia or subcutaneous lesions. Musculoskeletal: Stable degenerative changes involving the lumbar spine and stable osteoporosis. No acute bony findings or worrisome bone lesions. IMPRESSION: 1. No acute abdominal/pelvic findings, mass lesions or adenopathy. 2. Moderate stool throughout the colon may suggest constipation. 3.  Stable mild common bile duct dilatation due to a prior cholecystectomy. 4. Aortic atherosclerosis. Aortic Atherosclerosis (ICD10-I70.0). Electronically Signed   By: Marijo Sanes M.D.   On: 05/25/2020 05:47    ____________________________________________   PROCEDURES   Procedure(s) performed (including Critical Care):  Procedures   ____________________________________________   INITIAL IMPRESSION / MDM / [place] / ED COURSE  As part of my medical decision making, I reviewed the following data within the [place] History obtained from family, Nursing notes reviewed and incorporated, Labs reviewed , Old chart reviewed, Discussed with admitting physician  and Notes from prior ED visits   Differential diagnosis includes, but is not limited to, healthcare associated urinary tract infection which was resistant to outpatient treatment, pyelonephritis, diverticulitis, failure to thrive, electrolyte or metabolic abnormality, bladder outlet obstruction.  The patient's vital signs are stable.  Her basic metabolic panel is essentially within normal limits other than very mild hyponatremia.  Her CBC is also within normal limits.  However her Urinalysis is notable for nitrate positive urine with large leukocytes and greater than 50 WBCs.  This is consistent with her result last time in spite of treatment with Macrobid.  I reviewed the notes from her prior visit and the results including the CT scan results and the urine culture results which demonstrates a Klebsiella infection which should have been sensitive to Macrobid.  However she is clinically worsening and has findings consistent with persistent UTI.  Given her extensive drug allergies, I will discussed the case with the pharmacist to determine whether she would benefit from a medication such as aztreonam or meropenem or whether it is reasonable to try cephalosporin given the relatively low risk of cross-reactivity.   Additionally, given her tenderness to palpation of her lower abdomen, I think it is also reasonable to repeat a CT scan to rule out other intra-abdominal infection as well as to evaluate for the possibility of pyelonephritis developing since last CT scan.  I discussed this plan with the patient and her friend/caregiver who is at the bedside and they agree with the plan.  I will also give her a fluid bolus given her decreased oral intake and the need for IV contrast.  Clinical Course as of May 25 700  Tue May 25, 2020  0403 I had an extensive conversation with Shanon Brow the [facility] pharmacist.  He reviewed the patient's record as well and we came to the conclusion that a clinically significant reaction to a cephalosporin, particularly in an advanced generation like cefepime, is very unlikely based on the reported history of penicillin allergy.  Additionally, he pointed out that cefepime would be an appropriate choice given the somewhat chronic nature of her infection, the previous treatment with Macrobid, the Klebsiella but possibility of multiple organisms, etc.  I have ordered cefepime 2 g IV for treatment which will proceed as we are awaiting the results of the CT scan to rule out other intra-abdominal infection.  After that point, we know that there is not an acute surgical issue, I will consult the hospitalist for admission.   [CF]  0442 Lactic Acid, Venous: 0.9 [CF]  0442 Procalcitonin: <0.10 [CF]  0556 Stable CT without surgical intraabdominal abnormality.  Consulting hospitalist for admission.  CT ABDOMEN PELVIS W CONTRAST [CF]  0619 Discussed case with the hospitalist service (Dr. [name]) and she will admit.   [CF]    Clinical Course User Index [CF] Hinda Kehr, MD     ____________________________________________  FINAL CLINICAL IMPRESSION(S) / ED DIAGNOSES  Final diagnoses:  Urinary tract infection without hematuria, site unspecified  Failure to thrive in adult  LLQ pain  Nausea  and vomiting, intractability of vomiting not specified, unspecified vomiting type     MEDICATIONS GIVEN DURING THIS VISIT:  Medications  ceFEPIme (MAXIPIME) 2 g in sodium chloride 0.9 % 100 mL IVPB (has no administration in time range)  enoxaparin (LOVENOX) injection 30 mg (has no administration in time range)  0.9 %  sodium chloride infusion (has no administration in time range)  acetaminophen (TYLENOL) tablet 650 mg (has no administration in time range)    Or  acetaminophen (TYLENOL) suppository 650 mg (has no administration in time range)  ondansetron (ZOFRAN) tablet 4 mg (has no administration in time range)    Or  ondansetron (ZOFRAN) injection 4 mg (has no administration in time range)  HYDROcodone-acetaminophen (NORCO/VICODIN) 5-325 MG per tablet 1-2 tablet (has no administration in time range)  ceFEPIme (MAXIPIME) 2 g in sodium chloride 0.9 % 100 mL IVPB (0 g Intravenous Stopped 05/25/20 0629)  ondansetron (ZOFRAN) injection 4 mg (4 mg Intravenous Given 05/25/20 0503)  sodium chloride 0.9 % bolus 500 mL (500 mLs Intravenous New Bag/Given 05/25/20 0512)  iohexol (OMNIPAQUE) 300 MG/ML solution 75 mL (75 mLs Intravenous Contrast Given 05/25/20 0519)     ED Discharge Orders    None      *Please note:  LAVELL RIDINGS was evaluated in Emergency Department on 05/25/2020 for the symptoms described in the history of present illness. She was evaluated in the context of the global COVID-19 pandemic, which necessitated consideration that the patient might be at risk for infection with the SARS-CoV-2 virus that causes COVID-19. Institutional protocols and algorithms that pertain to the evaluation of patients at risk for COVID-19 are in a state of rapid change based on information released by regulatory bodies including the CDC and federal and state organizations. These policies and algorithms were followed during the patient's care in the ED.  Some ED evaluations and interventions may be  delayed as a result of limited staffing during and after the pandemic.*  Note:  This document was prepared using Dragon voice recognition software and may include unintentional dictation errors.  Hinda Kehr, MD 05/25/20 [phone]

## 2020-05-25 NOTE — Progress Notes (Signed)
Pharmacy Antibiotic Note  [name] is a 86 y.o. female admitted on 05/25/2020 with UTI.  Pharmacy has been consulted for cefepime dosing.  Plan: Will start patient on cefepime 2g IV q12h per CrCl 30 - 60 ml/min and will continue to monitor renal function and adjust doses per changes in renal function.     Temp (24hrs), Avg:98.6 F (37 C), Min:98.6 F (37 C), Max:98.6 F (37 C)  Recent Labs  Lab 05/24/20 2201 05/25/20 0410  WBC 6.3  --   CREATININE 0.90  --   LATICACIDVEN  --  0.9    Estimated Creatinine Clearance: 30 mL/min (by C-G formula based on SCr of 0.9 mg/dL).    Allergies  Allergen Reactions  . Bentyl [Dicyclomine Hcl] Other (See Comments)    abd pain  . Ciprofloxacin     Can cause GI upset, esp if taken with metamucil, o/w it is tolerated  . Cortisone Acetate     REACTION: nausea  . Doxycycline Other (See Comments)    GI upset  . Gabapentin     REACTION: severe dizziness  . Metoclopramide Hcl   . Penicillins     Has patient had a PCN reaction causing immediate rash, facial/tongue/throat swelling, SOB or lightheadedness with hypotension: yes Has patient had a PCN reaction causing severe rash involving mucus membranes or skin necrosis: yes Has patient had a PCN reaction that required hospitalization no Has patient had a PCN reaction occurring within the last 10 years: no If all of the above answers are "NO", then may proceed with Cephalosporin use.   . Promethazine Hcl     REACTION: unspecified  . Remeron [Mirtazapine] Other (See Comments)    Tolerates 15 mg/day but does not tolerate 30 mg.  Felt jittery with 30 mg dose.  Sarina Ill [Sulfamethoxazole-Trimethoprim] Other (See Comments)    GI upset but not allergy    Thank you for allowing pharmacy to be a part of this patient's care.  Tobie Lords, PharmD, BCPS Clinical Pharmacist 05/25/2020 5:12 AM

## 2020-05-25 NOTE — Progress Notes (Signed)
Patient requested prayer. Chaplain. Patient advised she been up all night she cannot sleep, so much going on. Chaplain offered prayer of comfort.

## 2020-05-25 NOTE — Plan of Care (Signed)
  Problem: Education: Goal: Knowledge of General Education information will improve Description: Including pain rating scale, medication(s)/side effects and non-pharmacologic comfort measures Outcome: Progressing Note: Oriented to room and surroundings provide freq  reorientation   Problem: Nutrition: Goal: Adequate nutrition will be maintained Note: Assist with setup  for feeding   Problem: Safety: Goal: Ability to remain free from injury will improve Note: High fall risk protocol /yellow  door sign/ [place]ies and  bed /chair alarm in use as well as freq  reorientation

## 2020-05-25 NOTE — ED Notes (Signed)
Report given to Annette RN

## 2020-05-25 NOTE — TOC Initial Note (Signed)
Transition of Care [facility]) - Initial/Assessment Note    Patient Details  Name: [name] MRN: [id number] Date of Birth: Mar 28, 1930  Transition of Care [facility]) CM/SW Contact:    Shelbie Hutching, RN Phone Number: 05/25/2020, 10:27 AM  Clinical Narrative:                 Patient placed under observation for acute cystitis, friend, Selena Batten is at the bedside with patient this morning.  RNCM introduced self and explained role.  Patient is from home where she lives alone.  Patient is independent in ADL's, drives, and uses a quad cane.  Patient has no family but has a friend and caregiver, Selena Batten, that would make decisions for the patient if the patient was unable.  Patient is current with her PCP, Dr. [name].  [facility] [place]  Patient would like home health services at discharge and has no preference in agency.  Corene Cornea with [facility] given referral for RN, PT, OT, and aide.   Patient has a rolling walker at home but does not currently use it.   Expected Discharge Plan: [place] Barriers to Discharge: Continued Medical Work up   Patient Goals and CMS Choice Patient states their goals for this hospitalization and ongoing recovery are:: Plan to return home with home health services CMS Medicare.gov Compare Post Acute Care list provided to:: Patient Choice offered to / list presented to : Patient  Expected Discharge Plan and Services Expected Discharge Plan: [place]   Discharge Planning Services: CM Consult Post Acute Care Choice: [place] arrangements for the past 2 months: Single Family Home                           HH Arranged: RN, PT, OT, Nurse's Aide [place] Agency: [facility] [place] ([place]) Date HH Agency Contacted: 05/25/20 Time HH Agency Contacted: 65 Representative spoke with at [place]: Floydene Flock  Prior Living Arrangements/Services Living arrangements for the past 2  months: [place] with:: Self Patient language and need for interpreter reviewed:: Yes Do you feel safe going back to the place where you live?: Yes      Need for Family Participation in Patient Care: Yes (Comment) (ongoing UTI) Care giver support system in place?: Yes (comment) (friend) Current home services: DME (quad cane) Criminal Activity/Legal Involvement Pertinent to Current Situation/Hospitalization: No - Comment as needed  Activities of Daily Living Home Assistive Devices/Equipment: Cane (specify quad or straight) (quad) ADL Screening (condition at time of admission) Patient's cognitive ability adequate to safely complete daily activities?: Yes Is the patient deaf or have difficulty hearing?: Yes Does the patient have difficulty seeing, even when wearing glasses/contacts?: No Does the patient have difficulty concentrating, remembering, or making decisions?: No Patient able to express need for assistance with ADLs?: Yes Does the patient have difficulty dressing or bathing?: No Independently performs ADLs?: Yes (appropriate for developmental age) Does the patient have difficulty walking or climbing stairs?: Yes Weakness of Legs: Both (generalized) Weakness of Arms/Hands: None  Permission Sought/Granted Permission sought to share information with : Case Manager, Other (comment), Family Supports Permission granted to share information with : Yes, Verbal Permission Granted  Share Information with NAME: Selena Batten  Permission granted to share info w AGENCY: [place] granted to share info w Relationship: Friend     Emotional Assessment Appearance:: Appears stated  age Attitude/Demeanor/Rapport: Engaged Affect (typically observed): Accepting Orientation: : Oriented to Self, Oriented to Place, Oriented to  Time, Oriented to Situation Alcohol / Substance Use: Not Applicable Psych Involvement: No (comment)  Admission diagnosis:  UTI (urinary tract  infection) [N39.0] LLQ pain [R10.32] Failure to thrive in adult [R62.7] Urinary tract infection without hematuria, site unspecified [N39.0] Nausea and vomiting, intractability of vomiting not specified, unspecified vomiting type [R11.2] Patient Active Problem List   Diagnosis Date Noted  . UTI (urinary tract infection) 05/25/2020  . Acute cystitis 05/25/2020  . Vitamin D deficiency 05/11/2019  . Impacted stool in rectum ([place]) 03/06/2019  . Skin tear of forearm without complication 82/42/3536  . Encounter for chronic pain management 11/14/2017  . Health care maintenance 09/26/2017  . Dysuria 07/23/2017  . Near syncope 03/18/2017  . Throat pain 11/03/2016  . Weight loss 09/20/2016  . Insomnia 03/10/2015  . Osteoporosis 11/01/2014  . Medicare annual wellness visit, subsequent 08/25/2014  . Advance care planning 08/25/2014  . Anxiety state 10/23/2012  . IBS (irritable bowel syndrome) 04/18/2011  . GERD (gastroesophageal reflux disease) 04/18/2011  . Barrett esophagus 04/18/2011  . MULTIPLE CRANIAL NERVE PALSIES 01/29/2009  . CONVERGENCE INSUFF/PALSY BINOCULAR EYE MOVEMENT 01/22/2009  . KERATOACANTHOMA 11/18/2007  . Hypothyroidism 02/15/2007  . HLD (hyperlipidemia) 02/15/2007  . Essential hypertension 02/15/2007  . MITRAL VALVE PROLAPSE 02/15/2007  . ATRIAL FIBRILLATION, PAROXYSMAL 02/15/2007  . DIVERTICULOSIS, COLON 02/15/2007  . POSTMENOPAUSAL STATUS 02/15/2007  . COLONIC POLYPS, HX OF 02/15/2007  . Depression 05/27/1998  . THYROID CANCER, HX OF 02/25/1994   PCP:  [name], MD Pharmacy:   [facility], [address] [place] [address] Phone: [phone] Fax: [phone]  [id number] [place], [address] [place] [address] Phone: [phone] Fax: [phone]  [id number] [place], [place] - [address] [place] [address] Phone:  [phone] Fax: [phone]     Social Determinants of Health (SDOH) Interventions    Readmission Risk Interventions No flowsheet data found.

## 2020-05-25 NOTE — H&P (Signed)
History and Physical    [name] CZY:[id number] DOB: 1930/08/31 DOA: 05/25/2020  PCP: [name], MD   Patient coming from: Home  I have personally briefly reviewed patient's old medical records in [place]  Chief Complaint: Abdominal pain/emesis   HPI: [name] is a 86 y.o. female with medical history significant for anxiety, Barrett's esophagus, hypertension, hypothyroidism, history of recurrent UTI who presents to the emergency room for evaluation of abdominal pain mostly in the suprapubic and left lower quadrant area which she has had for over 10 days and was seen in the emergency room for same.  She rates her abdominal pain a 7 out of 10 in intensity at its worst, it is nonradiating and is associated with persistent dysuria and frequency of urination as well as nausea but no vomiting.  She also complains of constipation and has not had a normal bowel movement for her in days.  During her last ER visit she was discharged on Macrobid because she had multiple drug allergies.  Patient has completed antibiotic therapy without any improvement in her symptoms. She denies having any chest pain, shortness of breath, fever, chills, dizziness or lightheadedness. She had a CT scan of abdomen and pelvis which showed no acute abdominal/pelvic findings, mass lesions or adenopathy. Moderate stool throughout the colon may suggest constipation. Patient continues to have pyuria.  She has a normal white cell count   ED Course: Patient is an 86 year old female who presents for evaluation of abdominal pain mostly in the left lower quadrant and suprapubic area associated with nausea, urinary frequency and dysuria.  She will be admitted to the hospital for failure of outpatient antibiotic therapy for UTI  Review of Systems: As per HPI otherwise 10 point review of systems negative.    Past Medical History:  Diagnosis Date  . Anxiety   . Barrett esophagus   . Basal cell  carcinoma   . Diplopia    history of,hospital 2/26-2/27/10  . Diverticulitis    colonoscopy 1995.  CT 01/31/09, 02/02/09  . Hard of hearing   . Hiatal hernia   . Hiatal hernia    EGD 1998, 2007 ( Dr. Sharlett Iles)  . Hyperlipemia   . Hypertension   . Hypothyroidism   . Osteoporosis DXA 2015  . Personal history of colonic polyps    colonoscpy, removed 1995  . Thyroid carcinoma ([place]) 01/1994  . UTI (urinary tract infection)    Chronic    Past Surgical History:  Procedure Laterality Date  . ABDOMINAL HYSTERECTOMY    . CHOLECYSTECTOMY    . HIP SURGERY  08/1993   due to fracture of right hip  . SKIN CANCER EXCISION     basal cell left hand, forehead  . THYROIDECTOMY  04/1994   thyroid cancer     reports that she has never smoked. She has never used smokeless tobacco. She reports that she does not drink alcohol and does not use drugs.  Allergies  Allergen Reactions  . Bentyl [Dicyclomine Hcl] Other (See Comments)    abd pain  . Ciprofloxacin     Can cause GI upset, esp if taken with metamucil, o/w it is tolerated  . Cortisone Acetate     REACTION: nausea  . Doxycycline Other (See Comments)    GI upset  . Gabapentin     REACTION: severe dizziness  . Metoclopramide Hcl   . Penicillins     Has patient had a PCN reaction causing immediate rash, facial/tongue/throat  swelling, SOB or lightheadedness with hypotension: yes Has patient had a PCN reaction causing severe rash involving mucus membranes or skin necrosis: yes Has patient had a PCN reaction that required hospitalization no Has patient had a PCN reaction occurring within the last 10 years: no If all of the above answers are "NO", then may proceed with Cephalosporin use.   . Promethazine Hcl     REACTION: unspecified  . Remeron [Mirtazapine] Other (See Comments)    Tolerates 15 mg/day but does not tolerate 30 mg.  Felt jittery with 30 mg dose.  Sarina Ill [Sulfamethoxazole-Trimethoprim] Other (See Comments)    GI upset but  not allergy    Family History  Problem Relation Age of Onset  . Cancer Brother        Pharyngeal   . Heart disease Brother        MI  . Diabetes Brother   . Diabetes Sister        amputation  . Hypertension Mother   . Stroke Father   . Kidney cancer Neg Hx   . Bladder Cancer Neg Hx      Prior to Admission medications   Medication Sig Start Date End Date Taking? Authorizing Provider  Artificial Saliva (BIOTENE DRY MOUTH MOISTURIZING) SOLN Use as directed in the mouth or throat.   Yes [provider]  ascorbic acid (VITAMIN C) 500 MG tablet Take 500 mg by mouth daily.   Yes [provider]  Cholecalciferol ([facility] D3) 25 MCG (1000 UT) capsule Take 1,000 Units by mouth daily.   Yes [provider]  clidinium-chlordiazePOXIDE ([name]) 5-2.5 MG capsule TAKE 1 CAPSULE BY MOUTH EVERY DAY 02/25/20  Yes [name], MD  ENSURE PLUS (ENSURE PLUS) LIQD Take 237 mLs by mouth.   Yes [provider]  levothyroxine (SYNTHROID) 88 MCG tablet TAKE 1 TABLET BY MOUTH ONCE A DAY. TAKE ON AN EMPTY STOMACH WITH A GLASS OF WATER AT LEAST 30-60 MIN BEFORE BREAKFAST 10/28/19  Yes [name], MD  lisinopril (ZESTRIL) 10 MG tablet TAKE 1 TABLET BY MOUTH DAILY AT BEDTIME 03/25/20  Yes [name], MD  loratadine ([facility] ALLERGY RELIEF) 10 MG dissolvable tablet Take 10 mg by mouth daily as needed for allergies.    Yes [provider]  LORazepam (ATIVAN) 0.5 MG tablet TAKE 1/2 TO 1 TABLET BY MOUTH TWICE DAILY AS NEEDED FOR ANXIETY 04/30/20  Yes [name], MD  mirtazapine (REMERON) 15 MG tablet TAKE ONE TABLET BY MOUTH AT BEDTIME 01/02/20  Yes [name], MD  NON FORMULARY Nutraflora  Probiotic   Yes [provider]  omeprazole (PRILOSEC) 40 MG capsule TAKE 1 CAPSULE BY MOUTH EVERY MORNING 45MINUTES BEFORE BREAKFAST 05/03/20  Yes [name], MD  polyethylene glycol powder (MIRALAX) 17 GM/SCOOP powder Take 17 g by mouth 2 (two) times daily as  needed. 03/06/19  Yes [name], MD    Physical Exam: Vitals:   05/25/20 0427 05/25/20 0633 05/25/20 0700 05/25/20 0855  BP:  (!) 187/70  (!) 148/65  Pulse: 84 94  92  Resp:  16  18  Temp:    97.9 F (36.6 C)  TempSrc:    Oral  SpO2: 98% 94%  98%  Weight:   40.8 kg 43 kg     Vitals:   05/25/20 0427 05/25/20 0633 05/25/20 0700 05/25/20 0855  BP:  (!) 187/70  (!) 148/65  Pulse: 84 94  92  Resp:  16  18  Temp:    97.9 F (36.6 C)  TempSrc:    Oral  SpO2: 98% 94%  98%  Weight:   40.8 kg 43 kg    Constitutional: NAD, alert and oriented x 3.  Chronically ill-appearing Eyes: PERRL, lids and conjunctivae pallor ENMT: Mucous membranes are moist.  Neck: normal, supple, no masses, no thyromegaly Respiratory: clear to auscultation bilaterally, no wheezing, no crackles. Normal respiratory effort. No accessory muscle use.  Cardiovascular: Regular rate and rhythm, no murmurs / rubs / gallops. No extremity edema. 2+ pedal pulses. No carotid bruits.  Abdomen: tenderness in the suprapubic area, no masses palpated. No hepatosplenomegaly. Bowel sounds positive.  Musculoskeletal: no clubbing / cyanosis. No joint deformity upper and lower extremities.  Skin: no rashes, lesions, ulcers.  Neurologic: No gross focal neurologic deficit. Psychiatric: Normal mood and affect.   Labs on Admission: I have personally reviewed following labs and imaging studies  CBC: Recent Labs  Lab 05/24/20 2201 05/25/20 0646  WBC 6.3 6.6  NEUTROABS 4.4  --   HGB 11.2* 13.3  HCT 31.6* 37.8  MCV 88.8 89.6  PLT 183 585   Basic Metabolic Panel: Recent Labs  Lab 05/24/20 2201 05/25/20 0646  NA 133*  --   K 4.4  --   CL 95*  --   CO2 28  --   GLUCOSE 111*  --   BUN 15  --   CREATININE 0.90 0.82  CALCIUM 8.5*  --    GFR: Estimated Creatinine Clearance: 32.8 mL/min (by C-G formula based on SCr of 0.82 mg/dL). Liver Function Tests: Recent Labs  Lab 05/24/20 2201  AST 22  ALT 11  ALKPHOS 57   BILITOT 0.8  PROT 6.9  ALBUMIN 3.8   Recent Labs  Lab 05/24/20 2201  LIPASE 22   No results for input(s): AMMONIA in the last 168 hours. Coagulation Profile: No results for input(s): INR, PROTIME in the last 168 hours. Cardiac Enzymes: No results for input(s): CKTOTAL, CKMB, CKMBINDEX, TROPONINI in the last 168 hours. BNP (last 3 results) No results for input(s): PROBNP in the last 8760 hours. HbA1C: No results for input(s): HGBA1C in the last 72 hours. CBG: No results for input(s): GLUCAP in the last 168 hours. Lipid Profile: No results for input(s): CHOL, HDL, LDLCALC, TRIG, CHOLHDL, LDLDIRECT in the last 72 hours. Thyroid Function Tests: No results for input(s): TSH, T4TOTAL, FREET4, T3FREE, THYROIDAB in the last 72 hours. Anemia Panel: No results for input(s): VITAMINB12, FOLATE, FERRITIN, TIBC, IRON, RETICCTPCT in the last 72 hours. Urine analysis:    Component Value Date/Time   COLORURINE YELLOW (A) 05/24/2020 2201   APPEARANCEUR CLOUDY (A) 05/24/2020 2201   APPEARANCEUR Cloudy (A) 02/26/2018 1308   LABSPEC 1.005 05/24/2020 2201   PHURINE 7.0 05/24/2020 2201   GLUCOSEU NEGATIVE 05/24/2020 2201   GLUCOSEU NEGATIVE 04/18/2011 1456   HGBUR NEGATIVE 05/24/2020 2201   HGBUR small 09/30/2010 1108   BILIRUBINUR NEGATIVE 05/24/2020 2201   BILIRUBINUR Neg 10/15/2018 1501   BILIRUBINUR Negative 02/26/2018 1308   KETONESUR NEGATIVE 05/24/2020 2201   PROTEINUR NEGATIVE 05/24/2020 2201   UROBILINOGEN 0.2 10/15/2018 1501   UROBILINOGEN 0.2 04/18/2011 1456   NITRITE POSITIVE (A) 05/24/2020 2201   LEUKOCYTESUR LARGE (A) 05/24/2020 2201    Radiological Exams on Admission: CT ABDOMEN PELVIS W CONTRAST  Result Date: 05/25/2020 CLINICAL DATA:  Failure to thrive. EXAM: CT ABDOMEN AND PELVIS WITH CONTRAST TECHNIQUE: Multidetector CT imaging of the abdomen and pelvis was performed using the  standard protocol following bolus administration of intravenous contrast. CONTRAST:  67mL  OMNIPAQUE IOHEXOL 300 MG/ML  SOLN COMPARISON:  CT scan 05/14/2020 FINDINGS: Lower chest: The lung bases are clear of acute process. No pleural effusion or pulmonary lesions. The heart is normal in size. No pericardial effusion. The distal esophagus and aorta are unremarkable. Hepatobiliary: No worrisome hepatic lesions. Stable mild common bile duct dilatation due to a gin prior cholecystectomy. Pancreas: Moderate pancreatic atrophy. No mass or acute inflammation. Spleen: Normal size. No focal lesions. Adrenals/Urinary Tract: The adrenal glands and kidneys are unremarkable and stable. No worrisome renal lesions or hydroureteronephrosis. The bladder is slightly distended. No bladder mass or asymmetric bladder wall thickening. No bladder calculi. Stomach/Bowel: Moderate stool throughout the colon may suggest constipation. No findings for small bowel obstruction. Vascular/Lymphatic: Stable atherosclerotic calcifications involving the aorta and branch vessels but no aneurysm or dissection. The major venous structures are patent. No mesenteric or retroperitoneal mass or adenopathy. Reproductive: Surgically absent. Other: No pelvic mass or adenopathy. No free pelvic fluid collections. No inguinal mass or adenopathy. No abdominal wall hernia or subcutaneous lesions. Musculoskeletal: Stable degenerative changes involving the lumbar spine and stable osteoporosis. No acute bony findings or worrisome bone lesions. IMPRESSION: 1. No acute abdominal/pelvic findings, mass lesions or adenopathy. 2. Moderate stool throughout the colon may suggest constipation. 3. Stable mild common bile duct dilatation due to a prior cholecystectomy. 4. Aortic atherosclerosis. Aortic Atherosclerosis (ICD10-I70.0). Electronically Signed   By: Marijo Sanes M.D.   On: 05/25/2020 05:47    EKG: Independently reviewed.   Assessment/Plan Principal Problem:   Acute cystitis Active Problems:   Hypothyroidism   Depression   Essential  hypertension   GERD (gastroesophageal reflux disease)   UTI (urinary tract infection)    Acute cystitis With failure of outpatient antibiotic therapy Patient continues to have frequency, dysuria and suprapubic pain Recent urine culture yielded Klebsiella pneumonia We will place patient on Rocephin 1 g IV daily    Hypothyroidism Continue Synthroid   Hypertension Continue lisinopril   Depression and anxiety Continue Remeron and as needed lorazepam  DVT prophylaxis: Lovenox Code Status: Full code Family Communication: Greater than 50% of time was spent discussing plan of care with patient at the bedside.  She verbalizes understanding and agrees with the plan. She lists Jearld Lesch as her healthcare power of attorney Disposition Plan: Back to previous home environment Consults called: None    Sharnita Bogucki MD [facility]     05/25/2020, 9:33 AM

## 2020-05-26 DIAGNOSIS — Z9049 Acquired absence of other specified parts of digestive tract: Secondary | ICD-10-CM | POA: Diagnosis not present

## 2020-05-26 DIAGNOSIS — R627 Adult failure to thrive: Secondary | ICD-10-CM | POA: Diagnosis present

## 2020-05-26 DIAGNOSIS — Z87440 Personal history of urinary (tract) infections: Secondary | ICD-10-CM | POA: Diagnosis not present

## 2020-05-26 DIAGNOSIS — Z8719 Personal history of other diseases of the digestive system: Secondary | ICD-10-CM | POA: Diagnosis not present

## 2020-05-26 DIAGNOSIS — Z79890 Hormone replacement therapy (postmenopausal): Secondary | ICD-10-CM | POA: Diagnosis not present

## 2020-05-26 DIAGNOSIS — Z79899 Other long term (current) drug therapy: Secondary | ICD-10-CM | POA: Diagnosis not present

## 2020-05-26 DIAGNOSIS — E890 Postprocedural hypothyroidism: Secondary | ICD-10-CM | POA: Diagnosis present

## 2020-05-26 DIAGNOSIS — K2270 Barrett's esophagus without dysplasia: Secondary | ICD-10-CM | POA: Diagnosis present

## 2020-05-26 DIAGNOSIS — Z8249 Family history of ischemic heart disease and other diseases of the circulatory system: Secondary | ICD-10-CM | POA: Diagnosis not present

## 2020-05-26 DIAGNOSIS — Z85850 Personal history of malignant neoplasm of thyroid: Secondary | ICD-10-CM | POA: Diagnosis not present

## 2020-05-26 DIAGNOSIS — K5900 Constipation, unspecified: Secondary | ICD-10-CM | POA: Diagnosis present

## 2020-05-26 DIAGNOSIS — H9190 Unspecified hearing loss, unspecified ear: Secondary | ICD-10-CM | POA: Diagnosis present

## 2020-05-26 DIAGNOSIS — I10 Essential (primary) hypertension: Secondary | ICD-10-CM | POA: Diagnosis not present

## 2020-05-26 DIAGNOSIS — Z823 Family history of stroke: Secondary | ICD-10-CM | POA: Diagnosis not present

## 2020-05-26 DIAGNOSIS — E785 Hyperlipidemia, unspecified: Secondary | ICD-10-CM | POA: Diagnosis present

## 2020-05-26 DIAGNOSIS — R682 Dry mouth, unspecified: Secondary | ICD-10-CM | POA: Diagnosis present

## 2020-05-26 DIAGNOSIS — Z85828 Personal history of other malignant neoplasm of skin: Secondary | ICD-10-CM | POA: Diagnosis not present

## 2020-05-26 DIAGNOSIS — F418 Other specified anxiety disorders: Secondary | ICD-10-CM | POA: Diagnosis present

## 2020-05-26 DIAGNOSIS — K219 Gastro-esophageal reflux disease without esophagitis: Secondary | ICD-10-CM | POA: Diagnosis present

## 2020-05-26 DIAGNOSIS — Z681 Body mass index (BMI) 19 or less, adult: Secondary | ICD-10-CM | POA: Diagnosis not present

## 2020-05-26 DIAGNOSIS — Z20822 Contact with and (suspected) exposure to covid-19: Secondary | ICD-10-CM | POA: Diagnosis present

## 2020-05-26 DIAGNOSIS — R64 Cachexia: Secondary | ICD-10-CM | POA: Diagnosis present

## 2020-05-26 DIAGNOSIS — N3000 Acute cystitis without hematuria: Secondary | ICD-10-CM | POA: Diagnosis not present

## 2020-05-26 DIAGNOSIS — N390 Urinary tract infection, site not specified: Secondary | ICD-10-CM | POA: Diagnosis present

## 2020-05-26 DIAGNOSIS — Z90710 Acquired absence of both cervix and uterus: Secondary | ICD-10-CM | POA: Diagnosis not present

## 2020-05-26 LAB — CBC
HCT: 32.7 % — ABNORMAL LOW (ref 36.0–46.0)
Hemoglobin: 11.6 g/dL — ABNORMAL LOW (ref 12.0–15.0)
MCH: 31.3 pg (ref 26.0–34.0)
MCHC: 35.5 g/dL (ref 30.0–36.0)
MCV: 88.1 fL (ref 80.0–100.0)
Platelets: 166 10*3/uL (ref 150–400)
RBC: 3.71 MIL/uL — ABNORMAL LOW (ref 3.87–5.11)
RDW: 12.6 % (ref 11.5–15.5)
WBC: 6.1 10*3/uL (ref 4.0–10.5)
nRBC: 0 % (ref 0.0–0.2)

## 2020-05-26 LAB — BASIC METABOLIC PANEL
Anion gap: 9 (ref 5–15)
BUN: 10 mg/dL (ref 8–23)
CO2: 27 mmol/L (ref 22–32)
Calcium: 8.4 mg/dL — ABNORMAL LOW (ref 8.9–10.3)
Chloride: 102 mmol/L (ref 98–111)
Creatinine, Ser: 0.8 mg/dL (ref 0.44–1.00)
GFR calc Af Amer: >60 mL/min (ref >60)
GFR calc non Af Amer: >60 mL/min (ref >60)
Glucose, Bld: 91 mg/dL (ref 70–99)
Potassium: 4 mmol/L (ref 3.5–5.1)
Sodium: 138 mmol/L (ref 135–145)

## 2020-05-26 MED ORDER — BISACODYL 10 MG RE SUPP
10.0000 mg | Freq: Every day | RECTAL | Status: DC | PRN
  Administered 2020-05-26: 19:00:00 10 mg via RECTAL
  Filled 2020-05-26: qty 1

## 2020-05-26 MED ORDER — BISACODYL 5 MG PO TBEC
5.0000 mg | DELAYED_RELEASE_TABLET | Freq: Every day | ORAL | Status: DC | PRN
  Administered 2020-05-26: 12:00:00 5 mg via ORAL
  Filled 2020-05-26: qty 1

## 2020-05-26 MED ORDER — ENSURE ENLIVE PO LIQD
237.0000 mL | Freq: Two times a day (BID) | ORAL | Status: DC
  Administered 2020-05-27: 09:00:00 237 mL via ORAL

## 2020-05-26 NOTE — Hospital Course (Signed)
[name] is a 86 y.o. female with medical history significant for anxiety, Barrett's esophagus, hypertension, hypothyroidism, history of recurrent UTI who presents to the emergency room for evaluation of abdominal pain mostly in the suprapubic and left lower quadrant x over 10 days.  She had been seen in the emergency room for same and discharged home with Macrobid for UTI.   She had completed the course of that without improvement in symptoms.  On admission, patient reported persistent dysuria and urinary frequency, in addition to abdominal discomfort due to constipation.  CT abdomen/pelvis showed no acute findings, mass lesions or adenopathy, but did show moderate stool throughout the colon.  Patient admitted to hospitalist service for IV antibiotics for UTI, having failed outpatient antibiotic therapy.

## 2020-05-26 NOTE — Progress Notes (Signed)
Initial Nutrition Assessment  DOCUMENTATION CODES:   Underweight  INTERVENTION:  Will d/c Boost Plus, available only in chocolate Ensure Enlive po BID, each supplement provides 350 kcal and 20 grams of protein (vanilla) Encouraged po intake of meals and supplements  NUTRITION DIAGNOSIS:   Inadequate oral intake related to acute illness, decreased appetite (recurrent UTI) as evidenced by per patient/family report.  GOAL:   Patient will meet greater than or equal to 90% of their needs   MONITOR:   PO intake, Weight trends, Labs, I & O's, Supplement acceptance  REASON FOR ASSESSMENT:   Malnutrition Screening Tool, Consult Assessment of nutrition requirement/status  ASSESSMENT:  RD working remotely.  86 year old female admitted for acute cystitis after presenting with suprapubic and left lower quadrant abdominal pain s/p completion of antibiotic therapy for recurrent UTI. Past medical history significant for anxiety, hypothyroidism,history of thyroid cancer s/p throidectomy (1995), Barrett's esophagus, HTN  Spoke with patient via phone, she reports feeling better, good po intake of breakfast meal and hoping to go home today. Patient stated that she still has not had a BM, reports she was given medication to help her go this morning, recalls last BM as very small a few days prior to coming to the hospital secondary to poor po intake. Patient reports usually having good appetite and intake at home as well as regular BMs. Patient reports that she has not been eating very well the past couple of weaks secondary to not feeling well due to UTI. She recalls scrambled eggs, half of a biscuit and drinking vanilla Ensure, cranberry juice, coffee. Per medications, patient is receiving Boost Plus BID. Supplement available only in chocolate flavor, will switch to vanilla Ensure due to patient preference. She has good po intake, noted 75-100%x 3 documented meals 6/29-6/30. RD encouraged po intake of  meals and supplements, discussed adequate hydration as well as slowly increasing fiber to aid with symptoms of constipation.   Patient recalls usually weighing ~105 lb Per chart, UBW 98-101 lb from 2019-2020.  Weights have trended down 5 lbs in 6 months which is insignificant for time frame, however concerning given patient is underweight as well as advanced age.  Medications reviewed and include: Vit C, D3, Zestril, Remeron, Protonix, Florastor Rocephin Labs reviewed  NUTRITION - FOCUSED PHYSICAL EXAM: Unable to complete at this time, RD working remotely.  Diet Order:   Diet Order            Diet Heart Room service appropriate? Yes; Fluid consistency: Thin  Diet effective now                 EDUCATION NEEDS:   Education needs have been addressed  Skin:  Skin Assessment: Reviewed RN Assessment  Last BM:  pta  Height:   Ht Readings from Last 1 Encounters:  05/14/20 5\' 2"  (1.575 m)    Weight:   Wt Readings from Last 1 Encounters:  05/25/20 43 kg    Ideal Body Weight:  50 kg  BMI:  Body mass index is 17.34 kg/m.  Estimated Nutritional Needs:   Kcal:  1400-1600  Protein:  70-80  Fluid:  >/= 1.3 L/day   Lajuan Lines, RD, LDN Clinical Nutrition After Hours/Weekend Pager # in [place]

## 2020-05-26 NOTE — Progress Notes (Signed)
PROGRESS NOTE    [name]   EYC:[id number]  DOB: March 12, 1930  PCP: [name], MD    DOA: 05/25/2020 LOS: 0   Brief Narrative   [name] is a 86 y.o. female with medical history significant for anxiety, Barrett's esophagus, hypertension, hypothyroidism, history of recurrent UTI who presents to the emergency room for evaluation of abdominal pain mostly in the suprapubic and left lower quadrant x over 10 days.  She had been seen in the emergency room for same and discharged home with Macrobid for UTI.   She had completed the course of that without improvement in symptoms.  On admission, patient reported persistent dysuria and urinary frequency, in addition to abdominal discomfort due to constipation.  CT abdomen/pelvis showed no acute findings, mass lesions or adenopathy, but did show moderate stool throughout the colon.  Patient admitted to hospitalist service for IV antibiotics for UTI, having failed outpatient antibiotic therapy.       Assessment & Plan   Principal Problem:   Acute cystitis Active Problems:   Hypothyroidism   Depression   Essential hypertension   GERD (gastroesophageal reflux disease)   UTI (urinary tract infection)   Acute cystitis -present on admission and persistent despite completing course of my exam patient.  Urine culture from the ED previously grew Klebsiella.  Continue IV Rocephin.  Follow urine culture from this admission.    Constipation - acute on chronic.  Patient has not had a BM for several days and has significant abdominal discomfort.  Continue bowel regimen, will add suppository if needed for later today.  Hypothyroidism -continue Synthroid  Essential hypertension - chronic, stable.  Continue lisinopril.  Depression with anxiety - chronic, stable.  Continue Remeron and as needed Ativan.  Patient BMI: Body mass index is 17.34 kg/m.   DVT prophylaxis: enoxaparin (LOVENOX) injection 30 mg Start: 05/25/20  0630   Diet:  Diet Orders (From admission, onward)    Start     Ordered   05/25/20 0625  Diet Heart Room service appropriate? Yes; Fluid consistency: Thin  Diet effective now       Question Answer Comment  Room service appropriate? Yes   Fluid consistency: Thin      05/25/20 0625            Code Status: Full Code    Subjective 05/26/20    Patient seen the bedside this morning.  She reports abdominal discomfort due to severe constipation.  Reports tolerating minimal food due to this.  She denies nausea or vomiting however.  No fevers or chills.  States her dysuria and urinary frequency is improving.  No acute events reported.   Disposition Plan & Communication   Status is: Inpatient  Remains inpatient appropriate because:IV treatments appropriate due to intensity of illness or inability to take PO   Dispo: The patient is from: Home              Anticipated d/c is to: Home              Anticipated d/c date is: 1 day              Patient currently is not medically stable to d/c.   Family Communication: None at bedside during encounter, will attempt to call   Consults, Procedures, Significant Events   Consultants:   none  Procedures:   none  Antimicrobials:   Rocephin   Objective   Vitals:   05/26/20 0307 05/26/20 0827 05/26/20 1153  05/26/20 1619  BP: 130/79 (!) 164/72 (!) 183/95 (!) 166/98  Pulse: 88 (!) 102 97 (!) 102  Resp: 16 17 17 16   Temp: (!) 97.5 F (36.4 C) 97.7 F (36.5 C) 98 F (36.7 C) 98.1 F (36.7 C)  TempSrc: Oral Oral Oral Oral  SpO2: 96% 96% 97% 97%  Weight:        Intake/Output Summary (Last 24 hours) at 05/26/2020 1731 Last data filed at 05/26/2020 1430 Gross per 24 hour  Intake 700 ml  Output 2000 ml  Net -1300 ml   Filed Weights   05/25/20 0700 05/25/20 0855  Weight: 40.8 kg 43 kg    Physical Exam:  General exam: awake, alert, no acute distress, frail HEENT: atraumatic, clear conjunctiva, anicteric sclera, moist  mucus membranes, hearing grossly normal  Respiratory system: CTAB, no wheezes, rales or rhonchi, normal respiratory effort. Cardiovascular system: normal S1/S2, RRR, no pedal edema.   Gastrointestinal system: Mildly distended, mild tenderness on palpation diffusely, hypoactive bowel sounds. Central nervous system: A&O x4. no gross focal neurologic deficits, normal speech Extremities: moves all, no cyanosis, normal tone Skin: dry, intact, normal temperature, normal color Psychiatry: normal mood, congruent affect, judgement and insight appear normal  Labs   Data Reviewed: I have personally reviewed following labs and imaging studies  CBC: Recent Labs  Lab 05/24/20 2201 05/25/20 0646 05/26/20 0427  WBC 6.3 6.6 6.1  NEUTROABS 4.4  --   --   HGB 11.2* 13.3 11.6*  HCT 31.6* 37.8 32.7*  MCV 88.8 89.6 88.1  PLT 183 192 409   Basic Metabolic Panel: Recent Labs  Lab 05/24/20 2201 05/25/20 0646 05/26/20 0427  NA 133*  --  138  K 4.4  --  4.0  CL 95*  --  102  CO2 28  --  27  GLUCOSE 111*  --  91  BUN 15  --  10  CREATININE 0.90 0.82 0.80  CALCIUM 8.5*  --  8.4*   GFR: Estimated Creatinine Clearance: 33.6 mL/min (by C-G formula based on SCr of 0.8 mg/dL). Liver Function Tests: Recent Labs  Lab 05/24/20 2201  AST 22  ALT 11  ALKPHOS 57  BILITOT 0.8  PROT 6.9  ALBUMIN 3.8   Recent Labs  Lab 05/24/20 2201  LIPASE 22   No results for input(s): AMMONIA in the last 168 hours. Coagulation Profile: No results for input(s): INR, PROTIME in the last 168 hours. Cardiac Enzymes: No results for input(s): CKTOTAL, CKMB, CKMBINDEX, TROPONINI in the last 168 hours. BNP (last 3 results) No results for input(s): PROBNP in the last 8760 hours. HbA1C: No results for input(s): HGBA1C in the last 72 hours. CBG: No results for input(s): GLUCAP in the last 168 hours. Lipid Profile: No results for input(s): CHOL, HDL, LDLCALC, TRIG, CHOLHDL, LDLDIRECT in the last 72 hours. Thyroid  Function Tests: No results for input(s): TSH, T4TOTAL, FREET4, T3FREE, THYROIDAB in the last 72 hours. Anemia Panel: No results for input(s): VITAMINB12, FOLATE, FERRITIN, TIBC, IRON, RETICCTPCT in the last 72 hours. Sepsis Labs: Recent Labs  Lab 05/24/20 2201 05/25/20 0410  PROCALCITON <0.10  --   LATICACIDVEN  --  0.9    Recent Results (from the past 240 hour(s))  Urine Culture     Status: Abnormal (Preliminary result)   Collection Time: 05/24/20 10:01 PM   Specimen: Urine, Clean Catch  Result Value Ref Range Status   Specimen Description   Final    URINE, CLEAN CATCH Performed at [facility]  Lab, [address] [place], [place] [address]    Special Requests   Final    Normal Performed at [facility], [address] [place], [place] [address]    Culture (A)  Final    >=100,000 COLONIES/mL KLEBSIELLA PNEUMONIAE SUSCEPTIBILITIES TO FOLLOW Performed at [facility], [place] [address], [place], [place] [address]    Report Status PENDING  Incomplete  SARS Coronavirus 2 by RT PCR (hospital order, performed in [facility] hospital lab) Nasopharyngeal Nasopharyngeal Swab     Status: None   Collection Time: 05/25/20  6:46 AM   Specimen: Nasopharyngeal Swab  Result Value Ref Range Status   SARS Coronavirus 2 NEGATIVE NEGATIVE Final    Comment: (NOTE) SARS-CoV-2 target nucleic acids are NOT DETECTED.  The SARS-CoV-2 RNA is generally detectable in upper and lower respiratory specimens during the acute phase of infection. The lowest concentration of SARS-CoV-2 viral copies this assay can detect is 250 copies / mL. A negative result does not preclude SARS-CoV-2 infection and should not be used as the sole basis for treatment or other patient management decisions.  A negative result may occur with improper specimen collection / handling, submission of specimen other than nasopharyngeal swab, presence of viral mutation(s) within the areas targeted by this  assay, and inadequate number of viral copies (<250 copies / mL). A negative result must be combined with clinical observations, patient history, and epidemiological information.  Fact Sheet for Patients:   StrictlyIdeas.no  Fact Sheet for Healthcare Providers: BankingDealers.co.za  This test is not yet approved or  cleared by the [place] FDA and has been authorized for detection and/or diagnosis of SARS-CoV-2 by FDA under an Emergency Use Authorization (EUA).  This EUA will remain in effect (meaning this test can be used) for the duration of the COVID-19 declaration under Section 564(b)(1) of the Act, 21 U.S.C. section 360bbb-3(b)(1), unless the authorization is terminated or revoked sooner.  Performed at [facility], [address] [place],  [address]       Imaging Studies   CT ABDOMEN PELVIS W CONTRAST  Result Date: 05/25/2020 CLINICAL DATA:  Failure to thrive. EXAM: CT ABDOMEN AND PELVIS WITH CONTRAST TECHNIQUE: Multidetector CT imaging of the abdomen and pelvis was performed using the standard protocol following bolus administration of intravenous contrast. CONTRAST:  84mL OMNIPAQUE IOHEXOL 300 MG/ML  SOLN COMPARISON:  CT scan 05/14/2020 FINDINGS: Lower chest: The lung bases are clear of acute process. No pleural effusion or pulmonary lesions. The heart is normal in size. No pericardial effusion. The distal esophagus and aorta are unremarkable. Hepatobiliary: No worrisome hepatic lesions. Stable mild common bile duct dilatation due to a gin prior cholecystectomy. Pancreas: Moderate pancreatic atrophy. No mass or acute inflammation. Spleen: Normal size. No focal lesions. Adrenals/Urinary Tract: The adrenal glands and kidneys are unremarkable and stable. No worrisome renal lesions or hydroureteronephrosis. The bladder is slightly distended. No bladder mass or asymmetric bladder wall thickening. No bladder calculi.  Stomach/Bowel: Moderate stool throughout the colon may suggest constipation. No findings for small bowel obstruction. Vascular/Lymphatic: Stable atherosclerotic calcifications involving the aorta and branch vessels but no aneurysm or dissection. The major venous structures are patent. No mesenteric or retroperitoneal mass or adenopathy. Reproductive: Surgically absent. Other: No pelvic mass or adenopathy. No free pelvic fluid collections. No inguinal mass or adenopathy. No abdominal wall hernia or subcutaneous lesions. Musculoskeletal: Stable degenerative changes involving the lumbar spine and stable osteoporosis. No acute bony findings or worrisome bone lesions.  IMPRESSION: 1. No acute abdominal/pelvic findings, mass lesions or adenopathy. 2. Moderate stool throughout the colon may suggest constipation. 3. Stable mild common bile duct dilatation due to a prior cholecystectomy. 4. Aortic atherosclerosis. Aortic Atherosclerosis (ICD10-I70.0). Electronically Signed   By: Marijo Sanes M.D.   On: 05/25/2020 05:47     Medications   Scheduled Meds: . ascorbic acid  500 mg Oral Daily  . cholecalciferol  1,000 Units Oral Daily  . clidinium-chlordiazePOXIDE  1 capsule Oral Daily  . enoxaparin (LOVENOX) injection  30 mg Subcutaneous Q24H  . feeding supplement (ENSURE ENLIVE)  237 mL Oral BID BM  . levothyroxine  88 mcg Oral Q0600  . lisinopril  10 mg Oral QHS  . mirtazapine  15 mg Oral QHS  . pantoprazole  40 mg Oral Daily  . saccharomyces boulardii  250 mg Oral BID   Continuous Infusions: . cefTRIAXone (ROCEPHIN)  IV 1 g (05/26/20 0940)       LOS: 0 days    Time spent: 30 minutes    Ezekiel Slocumb, DO [facility]  05/26/2020, 5:31 PM    If 7PM-7AM, please contact night-coverage. How to contact the [facility] Attending or Consulting provider [place] or covering provider during after hours [place], for this patient?    1. Check the care team in [facility] and look for a) attending/consulting  [facility] provider listed and b) the [facility] team listed 2. Log into [email or web address] and use [place]'s universal password to access. If you do not have the password, please contact the hospital operator. 3. Locate the [facility] provider you are looking for under [facility] and page to a number that you can be directly reached. 4. If you still have difficulty reaching the provider, please page the [facility] (Director on Call) for the Hospitalists listed on amion for assistance.

## 2020-05-27 LAB — URINE CULTURE
Culture: >=100000 — AB
Special Requests: NORMAL

## 2020-05-27 MED ORDER — CEFDINIR 300 MG PO CAPS: 300.0000 mg | ORAL_CAPSULE | Freq: Two times a day (BID) | ORAL | 0 refills | Status: AC

## 2020-05-27 MED ORDER — SACCHAROMYCES BOULARDII 250 MG PO CAPS: 250.0000 mg | ORAL_CAPSULE | Freq: Two times a day (BID) | ORAL | 0 refills | Status: AC

## 2020-05-27 NOTE — Discharge Summary (Signed)
Physician Discharge Summary  [name] [facility] QIO:[id number] DOB: 11-12-1930 DOA: 05/25/2020  PCP: [name], MD  Admit date: 05/25/2020 Discharge date: 05/27/2020  Admitted From: home Disposition:  home  Recommendations for Outpatient Follow-up:  1. Follow up with PCP in 1-2 weeks 2. Please obtain BMP/CBC in one week   Home Health: PT / OT / RN / Aide  Equipment/Devices: none  Discharge Condition: Stable  CODE STATUS: Full  Diet recommendation: Heart Healthy    Discharge Diagnoses: Principal Problem:   Acute cystitis Active Problems:   Hypothyroidism   Depression   Essential hypertension   GERD (gastroesophageal reflux disease)   UTI (urinary tract infection)    Summary of HPI and Hospital Course:  [name] is a 86 y.o. female with medical history significant for anxiety, Barrett's esophagus, hypertension, hypothyroidism, history of recurrent UTI who presents to the emergency room for evaluation of abdominal pain mostly in the suprapubic and left lower quadrant x over 10 days.  She had been seen in the emergency room for same and discharged home with Macrobid for UTI.   She had completed the course of that without improvement in symptoms.  On admission, patient reported persistent dysuria and urinary frequency, in addition to abdominal discomfort due to constipation.  CT abdomen/pelvis showed no acute findings, mass lesions or adenopathy, but did show moderate stool throughout the colon.  Patient admitted to hospitalist service for IV antibiotics for UTI, having failed outpatient antibiotic therapy.      Acute cystitis -present on admission and persistent despite completing course of my exam patient.  Urine culture from the ED previously grew Klebsiella.  Treated with IV Rocephin based on prior ED cultlure.  Urine culture from this admission also growing Klebsiella.  Discharge with 2 more days Omnicef to complete the course.  Will follow up final culture  susceptibilities.  Constipation - acute on chronic. Resolved with stool softeners.  Patient has not had a BM for several days and has significant abdominal discomfort.  Continue Miralax at home.  Hypothyroidism -continue Synthroid  Essential hypertension - chronic, stable.  Continue lisinopril.  Depression with anxiety - chronic, stable.  Continue Remeron and as needed Ativan.    Discharge Instructions    Allergies as of 05/27/2020      Reactions   Bentyl [dicyclomine Hcl] Other (See Comments)   abd pain   Ciprofloxacin    Can cause GI upset, esp if taken with metamucil, o/w it is tolerated   Cortisone Acetate    REACTION: nausea   Doxycycline Other (See Comments)   GI upset   Gabapentin    REACTION: severe dizziness   Metoclopramide Hcl    Penicillins    Has patient had a PCN reaction causing immediate rash, facial/tongue/throat swelling, SOB or lightheadedness with hypotension: yes Has patient had a PCN reaction causing severe rash involving mucus membranes or skin necrosis: yes Has patient had a PCN reaction that required hospitalization no Has patient had a PCN reaction occurring within the last 10 years: no If all of the above answers are "NO", then may proceed with Cephalosporin use.   Promethazine Hcl    REACTION: unspecified   Remeron [mirtazapine] Other (See Comments)   Tolerates 15 mg/day but does not tolerate 30 mg.  Felt jittery with 30 mg dose.   Septra [sulfamethoxazole-trimethoprim] Other (See Comments)   GI upset but not allergy      Medication List    TAKE these medications   ascorbic acid  500 MG tablet Commonly known as: VITAMIN C Take 500 mg by mouth daily.   Biotene Dry Mouth Moisturizing Soln Use as directed in the mouth or throat.   cefdinir 300 MG capsule Commonly known as: OMNICEF Take 1 capsule (300 mg total) by mouth 2 (two) times daily for 2 days.   clidinium-chlordiazePOXIDE 5-2.5 MG capsule Commonly known as: [name] TAKE 1  CAPSULE BY MOUTH EVERY DAY   [employer] Allergy Relief 10 MG dissolvable tablet Generic drug: loratadine Take 10 mg by mouth daily as needed for allergies.   [employer] D3 25 MCG (1000 UT) capsule Generic drug: Cholecalciferol Take 1,000 Units by mouth daily.   Ensure Plus Liqd Take 237 mLs by mouth.   levothyroxine 88 MCG tablet Commonly known as: SYNTHROID TAKE 1 TABLET BY MOUTH ONCE A DAY. TAKE ON AN EMPTY STOMACH WITH A GLASS OF WATER AT LEAST 30-60 MIN BEFORE BREAKFAST   lisinopril 10 MG tablet Commonly known as: ZESTRIL TAKE 1 TABLET BY MOUTH DAILY AT BEDTIME   LORazepam 0.5 MG tablet Commonly known as: ATIVAN TAKE 1/2 TO 1 TABLET BY MOUTH TWICE DAILY AS NEEDED FOR ANXIETY   mirtazapine 15 MG tablet Commonly known as: REMERON TAKE ONE TABLET BY MOUTH AT BEDTIME   NON FORMULARY Nutraflora  Probiotic   omeprazole 40 MG capsule Commonly known as: PRILOSEC TAKE 1 CAPSULE BY MOUTH EVERY MORNING 45MINUTES BEFORE BREAKFAST   polyethylene glycol powder 17 GM/SCOOP powder Commonly known as: MiraLax Take 17 g by mouth 2 (two) times daily as needed.   saccharomyces boulardii 250 MG capsule Commonly known as: FLORASTOR Take 1 capsule (250 mg total) by mouth 2 (two) times daily for 5 days.       Allergies  Allergen Reactions  . Bentyl [Dicyclomine Hcl] Other (See Comments)    abd pain  . Ciprofloxacin     Can cause GI upset, esp if taken with metamucil, o/w it is tolerated  . Cortisone Acetate     REACTION: nausea  . Doxycycline Other (See Comments)    GI upset  . Gabapentin     REACTION: severe dizziness  . Metoclopramide Hcl   . Penicillins     Has patient had a PCN reaction causing immediate rash, facial/tongue/throat swelling, SOB or lightheadedness with hypotension: yes Has patient had a PCN reaction causing severe rash involving mucus membranes or skin necrosis: yes Has patient had a PCN reaction that required hospitalization no Has patient had a PCN reaction  occurring within the last 10 years: no If all of the above answers are "NO", then may proceed with Cephalosporin use.   . Promethazine Hcl     REACTION: unspecified  . Remeron [Mirtazapine] Other (See Comments)    Tolerates 15 mg/day but does not tolerate 30 mg.  Felt jittery with 30 mg dose.  Sarina Ill [Sulfamethoxazole-Trimethoprim] Other (See Comments)    GI upset but not allergy    Consultations:  none    Procedures/Studies: CT ABDOMEN PELVIS W CONTRAST  Result Date: 05/25/2020 CLINICAL DATA:  Failure to thrive. EXAM: CT ABDOMEN AND PELVIS WITH CONTRAST TECHNIQUE: Multidetector CT imaging of the abdomen and pelvis was performed using the standard protocol following bolus administration of intravenous contrast. CONTRAST:  87mL OMNIPAQUE IOHEXOL 300 MG/ML  SOLN COMPARISON:  CT scan 05/14/2020 FINDINGS: Lower chest: The lung bases are clear of acute process. No pleural effusion or pulmonary lesions. The heart is normal in size. No pericardial effusion. The distal esophagus and aorta are unremarkable. Hepatobiliary:  No worrisome hepatic lesions. Stable mild common bile duct dilatation due to a gin prior cholecystectomy. Pancreas: Moderate pancreatic atrophy. No mass or acute inflammation. Spleen: Normal size. No focal lesions. Adrenals/Urinary Tract: The adrenal glands and kidneys are unremarkable and stable. No worrisome renal lesions or hydroureteronephrosis. The bladder is slightly distended. No bladder mass or asymmetric bladder wall thickening. No bladder calculi. Stomach/Bowel: Moderate stool throughout the colon may suggest constipation. No findings for small bowel obstruction. Vascular/Lymphatic: Stable atherosclerotic calcifications involving the aorta and branch vessels but no aneurysm or dissection. The major venous structures are patent. No mesenteric or retroperitoneal mass or adenopathy. Reproductive: Surgically absent. Other: No pelvic mass or adenopathy. No free pelvic fluid  collections. No inguinal mass or adenopathy. No abdominal wall hernia or subcutaneous lesions. Musculoskeletal: Stable degenerative changes involving the lumbar spine and stable osteoporosis. No acute bony findings or worrisome bone lesions. IMPRESSION: 1. No acute abdominal/pelvic findings, mass lesions or adenopathy. 2. Moderate stool throughout the colon may suggest constipation. 3. Stable mild common bile duct dilatation due to a prior cholecystectomy. 4. Aortic atherosclerosis. Aortic Atherosclerosis (ICD10-I70.0). Electronically Signed   By: Marijo Sanes M.D.   On: 05/25/2020 05:47   CT ABDOMEN PELVIS W CONTRAST  Result Date: 05/14/2020 CLINICAL DATA:  Abdominal pain left lower quadrant with constipation 4 days. EXAM: CT ABDOMEN AND PELVIS WITH CONTRAST TECHNIQUE: Multidetector CT imaging of the abdomen and pelvis was performed using the standard protocol following bolus administration of intravenous contrast. CONTRAST:  22mL OMNIPAQUE IOHEXOL 300 MG/ML  SOLN COMPARISON:  12/16/2018 FINDINGS: Lower chest: Lung bases are normal. Hepatobiliary: Previous cholecystectomy. Liver and biliary tree are unremarkable. Pancreas: Normal. Spleen: Normal. Adrenals/Urinary Tract: Adrenal glands are normal. Kidneys are normal in size without hydronephrosis. There is a subcentimeter hypodensity over the right mid to lower pole cortex too small to characterize but likely a cyst and unchanged. Ureters are within normal. There is moderate bladder distention without significant change from the prior exam. Stomach/Bowel: Stomach and small bowel are normal. Appendix is not well visualized. Mild fecal retention throughout the colon which is otherwise unremarkable. Vascular/Lymphatic: Mild calcified plaque over the abdominal aorta which is normal in caliber. No evidence of adenopathy. Reproductive: Evidence of previous hysterectomy. Other: No free fluid or focal inflammatory change. Musculoskeletal: Orthopedic screws over the  right femoral neck intact. Degenerative changes of the spine with multilevel disc disease over the lumbar spine. Mild grade 1 anterolisthesis of L4 on L5. IMPRESSION: 1.  No acute findings in the abdomen/pelvis. 2. Subcentimeter hypodensity over the mid to lower pole cortex of the right kidney too small to characterize but likely a cyst and unchanged. 3.  Aortic Atherosclerosis (ICD10-I70.0). 4. Stable bladder distension which may be due to a degree of bladder outlet obstruction. Recommend clinical correlation. Electronically Signed   By: Marin Olp M.D.   On: 05/14/2020 18:58       Subjective: Patient seen this AM.  Reports she had a large BM yesterday evening after stool softeners.  Says her abdomen feels better today.  Reports dysuria and frequency are better.  Says ready to return home.   Discharge Exam: Vitals:   05/27/20 0446 05/27/20 0741  BP: (!) 151/87 (!) 153/88  Pulse: 96 97  Resp: 16 16  Temp: 97.7 F (36.5 C) 98.4 F (36.9 C)  SpO2: 95% 97%   Vitals:   05/26/20 1944 05/26/20 2337 05/27/20 0446 05/27/20 0741  BP: (!) 162/98 105/65 (!) 151/87 (!) 153/88  Pulse: (!) 108  96 96 97  Resp: 16 16 16 16   Temp: 97.7 F (36.5 C) 98 F (36.7 C) 97.7 F (36.5 C) 98.4 F (36.9 C)  TempSrc: Oral Oral Oral   SpO2: 97% 96% 95% 97%  Weight:        General: Pt is alert, awake, not in acute distress, frail Cardiovascular: RRR, S1/S2 +, no rubs, no gallops Respiratory: CTA bilaterally, no wheezing, no rhonchi Abdominal: Soft, NT, ND, bowel sounds + Extremities: no edema, no cyanosis    The results of significant diagnostics from this hospitalization (including imaging, microbiology, ancillary and laboratory) are listed below for reference.     Microbiology: Recent Results (from the past 240 hour(s))  Urine Culture     Status: Abnormal (Preliminary result)   Collection Time: 05/24/20 10:01 PM   Specimen: Urine, Clean Catch  Result Value Ref Range Status   Specimen  Description   Final    URINE, CLEAN CATCH Performed at [facility], [address] [place], [place] [address]    Special Requests   Final    Normal Performed at [facility], [address] [place], [place] [address]    Culture (A)  Final    >=100,000 COLONIES/mL KLEBSIELLA PNEUMONIAE SUSCEPTIBILITIES TO FOLLOW Performed at [facility], [place] [address] [place], [place] [address]    Report Status PENDING  Incomplete  SARS Coronavirus 2 by RT PCR (hospital order, performed in [facility] hospital lab) Nasopharyngeal Nasopharyngeal Swab     Status: None   Collection Time: 05/25/20  6:46 AM   Specimen: Nasopharyngeal Swab  Result Value Ref Range Status   SARS Coronavirus 2 NEGATIVE NEGATIVE Final    Comment: (NOTE) SARS-CoV-2 target nucleic acids are NOT DETECTED.  The SARS-CoV-2 RNA is generally detectable in upper and lower respiratory specimens during the acute phase of infection. The lowest concentration of SARS-CoV-2 viral copies this assay can detect is 250 copies / mL. A negative result does not preclude SARS-CoV-2 infection and should not be used as the sole basis for treatment or other patient management decisions.  A negative result may occur with improper specimen collection / handling, submission of specimen other than nasopharyngeal swab, presence of viral mutation(s) within the areas targeted by this assay, and inadequate number of viral copies (<250 copies / mL). A negative result must be combined with clinical observations, patient history, and epidemiological information.  Fact Sheet for Patients:   StrictlyIdeas.no  Fact Sheet for Healthcare Providers: BankingDealers.co.za  This test is not yet approved or  cleared by the [place] FDA and has been authorized for detection and/or diagnosis of SARS-CoV-2 by FDA under an Emergency Use Authorization (EUA).  This EUA will remain in  effect (meaning this test can be used) for the duration of the COVID-19 declaration under Section 564(b)(1) of the Act, 21 U.S.C. section 360bbb-3(b)(1), unless the authorization is terminated or revoked sooner.  Performed at [facility], [place] [place], [place] [address]      Labs: BNP (last 3 results) No results for input(s): BNP in the last 8760 hours. Basic Metabolic Panel: Recent Labs  Lab 05/24/20 2201 05/25/20 0646 05/26/20 0427  NA 133*  --  138  K 4.4  --  4.0  CL 95*  --  102  CO2 28  --  27  GLUCOSE 111*  --  91  BUN 15  --  10  CREATININE 0.90 0.82 0.80  CALCIUM 8.5*  --  8.4*  Liver Function Tests: Recent Labs  Lab 05/24/20 2201  AST 22  ALT 11  ALKPHOS 57  BILITOT 0.8  PROT 6.9  ALBUMIN 3.8   Recent Labs  Lab 05/24/20 2201  LIPASE 22   No results for input(s): AMMONIA in the last 168 hours. CBC: Recent Labs  Lab 05/24/20 2201 05/25/20 0646 05/26/20 0427  WBC 6.3 6.6 6.1  NEUTROABS 4.4  --   --   HGB 11.2* 13.3 11.6*  HCT 31.6* 37.8 32.7*  MCV 88.8 89.6 88.1  PLT 183 192 166   Cardiac Enzymes: No results for input(s): CKTOTAL, CKMB, CKMBINDEX, TROPONINI in the last 168 hours. BNP: Invalid input(s): POCBNP CBG: No results for input(s): GLUCAP in the last 168 hours. D-Dimer No results for input(s): DDIMER in the last 72 hours. Hgb A1c No results for input(s): HGBA1C in the last 72 hours. Lipid Profile No results for input(s): CHOL, HDL, LDLCALC, TRIG, CHOLHDL, LDLDIRECT in the last 72 hours. Thyroid function studies No results for input(s): TSH, T4TOTAL, T3FREE, THYROIDAB in the last 72 hours.  Invalid input(s): FREET3 Anemia work up No results for input(s): VITAMINB12, FOLATE, FERRITIN, TIBC, IRON, RETICCTPCT in the last 72 hours. Urinalysis    Component Value Date/Time   COLORURINE YELLOW (A) 05/24/2020 2201   APPEARANCEUR CLOUDY (A) 05/24/2020 2201   APPEARANCEUR Cloudy (A) 02/26/2018 1308   LABSPEC  1.005 05/24/2020 2201   PHURINE 7.0 05/24/2020 2201   GLUCOSEU NEGATIVE 05/24/2020 2201   GLUCOSEU NEGATIVE 04/18/2011 1456   HGBUR NEGATIVE 05/24/2020 2201   HGBUR small 09/30/2010 1108   BILIRUBINUR NEGATIVE 05/24/2020 2201   BILIRUBINUR Neg 10/15/2018 1501   BILIRUBINUR Negative 02/26/2018 1308   KETONESUR NEGATIVE 05/24/2020 2201   PROTEINUR NEGATIVE 05/24/2020 2201   UROBILINOGEN 0.2 10/15/2018 1501   UROBILINOGEN 0.2 04/18/2011 1456   NITRITE POSITIVE (A) 05/24/2020 2201   LEUKOCYTESUR LARGE (A) 05/24/2020 2201   Sepsis Labs Invalid input(s): PROCALCITONIN,  WBC,  LACTICIDVEN Microbiology Recent Results (from the past 240 hour(s))  Urine Culture     Status: Abnormal (Preliminary result)   Collection Time: 05/24/20 10:01 PM   Specimen: Urine, Clean Catch  Result Value Ref Range Status   Specimen Description   Final    URINE, CLEAN CATCH Performed at [facility], [address] [place], [place] [address]    Special Requests   Final    Normal Performed at [facility], [address], [place], [place] [address]    Culture (A)  Final    >=100,000 COLONIES/mL KLEBSIELLA PNEUMONIAE SUSCEPTIBILITIES TO FOLLOW Performed at [facility], [place] [address], [place], [place] [address]    Report Status PENDING  Incomplete  SARS Coronavirus 2 by RT PCR (hospital order, performed in [facility] hospital lab) Nasopharyngeal Nasopharyngeal Swab     Status: None   Collection Time: 05/25/20  6:46 AM   Specimen: Nasopharyngeal Swab  Result Value Ref Range Status   SARS Coronavirus 2 NEGATIVE NEGATIVE Final    Comment: (NOTE) SARS-CoV-2 target nucleic acids are NOT DETECTED.  The SARS-CoV-2 RNA is generally detectable in upper and lower respiratory specimens during the acute phase of infection. The lowest concentration of SARS-CoV-2 viral copies this assay can detect is 250 copies / mL. A negative result does not preclude SARS-CoV-2 infection and should  not be used as the sole basis for treatment or other patient management decisions.  A negative result may occur with improper specimen collection / handling, submission of specimen  other than nasopharyngeal swab, presence of viral mutation(s) within the areas targeted by this assay, and inadequate number of viral copies (<250 copies / mL). A negative result must be combined with clinical observations, patient history, and epidemiological information.  Fact Sheet for Patients:   StrictlyIdeas.no  Fact Sheet for Healthcare Providers: BankingDealers.co.za  This test is not yet approved or  cleared by the [place] FDA and has been authorized for detection and/or diagnosis of SARS-CoV-2 by FDA under an Emergency Use Authorization (EUA).  This EUA will remain in effect (meaning this test can be used) for the duration of the COVID-19 declaration under Section 564(b)(1) of the Act, 21 U.S.C. section 360bbb-3(b)(1), unless the authorization is terminated or revoked sooner.  Performed at [facility], [address] [place], [place] [address]      Time coordinating discharge: Over 30 minutes  SIGNED:   Ezekiel Slocumb, DO [facility] 05/27/2020, 9:33 AM   If 7PM-7AM, please contact night-coverage [email or web address]

## 2020-05-27 NOTE — Progress Notes (Signed)
Patient discharged home. Patient transported home via POV. After Visit Summary reviewed with patient and patient has no questions at this time.

## 2020-05-27 NOTE — Progress Notes (Signed)
This RN spoke with family friend, May Lamb, regarding patient's discharge at the request of the patient. Ms. Arline Asp stated that she will be able to pick up patient and transport patient home. Patient notified.

## 2020-05-27 NOTE — Plan of Care (Signed)
  Problem: Urinary Elimination: Goal: Signs and symptoms of infection will decrease Outcome: Adequate for Discharge   Problem: Education: Goal: Knowledge of General Education information will improve Description: Including pain rating scale, medication(s)/side effects and non-pharmacologic comfort measures Outcome: Adequate for Discharge   Problem: Activity: Goal: Risk for activity intolerance will decrease Outcome: Adequate for Discharge   Problem: Nutrition: Goal: Adequate nutrition will be maintained Outcome: Adequate for Discharge   Problem: Pain Managment: Goal: General experience of comfort will improve Outcome: Adequate for Discharge   Problem: Safety: Goal: Ability to remain free from injury will improve Outcome: Adequate for Discharge   Problem: Skin Integrity: Goal: Risk for impaired skin integrity will decrease Outcome: Adequate for Discharge

## 2020-05-27 NOTE — TOC Transition Note (Signed)
Transition of Care [facility]) - CM/SW Discharge Note   Patient Details  Name: [name] MRN: [id number] Date of Birth: 1930-07-21  Transition of Care [facility]) CM/SW Contact:  Shelbie Hutching, RN Phone Number: 05/27/2020, 1:16 PM   Clinical Narrative:    Patient medically cleared for discharge home with home health services.  [facility] will provide home health RN, PT, OT and aide.  Floydene Flock with [facility] is aware of discharge today.  Patient has a walker at home and she will have her neighbor pick her up from the hospital.     Final next level of care: [place] Barriers to Discharge: Barriers Resolved   Patient Goals and CMS Choice Patient states their goals for this hospitalization and ongoing recovery are:: Plan to return home with home health services CMS Medicare.gov Compare Post Acute Care list provided to:: Patient Choice offered to / list presented to : Patient  Discharge Placement                       Discharge Plan and Services   Discharge Planning Services: CM Consult Post Acute Care Choice: Home Health                    HH Arranged: RN, PT, OT, Nurse's Aide [facility] Agency: [place] ([place]) Date [facility] Agency Contacted: 05/27/20 Time [place]: [phone] Representative spoke with at [place]: [place] (SDOH) Interventions     Readmission Risk Interventions No flowsheet data found.

## 2020-05-28 ENCOUNTER — Telehealth (INDEPENDENT_AMBULATORY_CARE_PROVIDER_SITE_OTHER): Payer: Medicare Other | Admitting: Family Medicine

## 2020-05-28 ENCOUNTER — Telehealth: Payer: Self-pay

## 2020-05-28 ENCOUNTER — Encounter: Payer: Self-pay | Admitting: Family Medicine

## 2020-05-28 DIAGNOSIS — B961 Klebsiella pneumoniae [K. pneumoniae] as the cause of diseases classified elsewhere: Secondary | ICD-10-CM | POA: Diagnosis not present

## 2020-05-28 DIAGNOSIS — I10 Essential (primary) hypertension: Secondary | ICD-10-CM | POA: Diagnosis not present

## 2020-05-28 DIAGNOSIS — K449 Diaphragmatic hernia without obstruction or gangrene: Secondary | ICD-10-CM | POA: Diagnosis not present

## 2020-05-28 DIAGNOSIS — M810 Age-related osteoporosis without current pathological fracture: Secondary | ICD-10-CM | POA: Diagnosis not present

## 2020-05-28 DIAGNOSIS — K5909 Other constipation: Secondary | ICD-10-CM | POA: Diagnosis not present

## 2020-05-28 DIAGNOSIS — F329 Major depressive disorder, single episode, unspecified: Secondary | ICD-10-CM | POA: Diagnosis not present

## 2020-05-28 DIAGNOSIS — H532 Diplopia: Secondary | ICD-10-CM | POA: Diagnosis not present

## 2020-05-28 DIAGNOSIS — Z85850 Personal history of malignant neoplasm of thyroid: Secondary | ICD-10-CM | POA: Diagnosis not present

## 2020-05-28 DIAGNOSIS — H9190 Unspecified hearing loss, unspecified ear: Secondary | ICD-10-CM | POA: Diagnosis not present

## 2020-05-28 DIAGNOSIS — E785 Hyperlipidemia, unspecified: Secondary | ICD-10-CM | POA: Diagnosis not present

## 2020-05-28 DIAGNOSIS — Z85828 Personal history of other malignant neoplasm of skin: Secondary | ICD-10-CM | POA: Diagnosis not present

## 2020-05-28 DIAGNOSIS — K219 Gastro-esophageal reflux disease without esophagitis: Secondary | ICD-10-CM | POA: Diagnosis not present

## 2020-05-28 DIAGNOSIS — N3000 Acute cystitis without hematuria: Secondary | ICD-10-CM | POA: Diagnosis not present

## 2020-05-28 DIAGNOSIS — F419 Anxiety disorder, unspecified: Secondary | ICD-10-CM | POA: Diagnosis not present

## 2020-05-28 DIAGNOSIS — E039 Hypothyroidism, unspecified: Secondary | ICD-10-CM | POA: Diagnosis not present

## 2020-05-28 DIAGNOSIS — K2270 Barrett's esophagus without dysplasia: Secondary | ICD-10-CM | POA: Diagnosis not present

## 2020-05-28 NOTE — Telephone Encounter (Signed)
[name] with [place] advised.

## 2020-05-28 NOTE — Telephone Encounter (Signed)
Please give the order.  Thanks.   

## 2020-05-28 NOTE — Progress Notes (Signed)
Interactive audio and video telecommunications were attempted between this provider and patient, however failed, due to patient having technical difficulties OR patient did not have access to video capability.  We continued and completed visit with audio only.   Virtual Visit via Telephone Note  I connected with patient on 05/28/20  at 3:46 PM  by telephone and verified that I am speaking with the correct person using two identifiers.  Location of patient: home  Location of MD: [place] Name of referring provider (if blank then none associated): Names per persons and role in encounter:  MD: Earlyne Iba, Patient: name listed above.    I discussed the limitations, risks, security and privacy concerns of performing an evaluation and management service by telephone and the availability of in person appointments. I also discussed with the patient that there may be a patient responsible charge related to this service. The patient expressed understanding and agreed to proceed.  CC: inpatient f/u.    History of Present Illness:   She is still having some pain from hemorrhoids.  No bleeding. She had BM yesterday.  We talked about bowel regimen. No abd pain.  No fevers.  No vomiting.  No diarrhea.  Discussed PO intake.  She had eggs this AM for breakfast, then had ensure.  She has [place] coming out, seen today.  She could take miralax if needed.    Still on abx for UTI.  She can complete her prescribed course.  She has GI clinic f/u pending.    We talked about getting HH to recheck BMET and CBC next week.  See follow-up phone note   Observations/Objective: nad Speech normal.  Assessment and Plan: UTI, status post inpatient treatment and she is going to complete her current antibiotics.  No change in medications at this point.  We talked about maintaining her bowel regimen and she is going to follow-up with the GI clinic as previously scheduled regarding weight loss evaluation. We talked  about getting HH to recheck BMET and CBC next week.  See follow-up phone note No change in medications at this point.  She agrees   Follow Up Instructions: see above.    I discussed the assessment and treatment plan with the patient. The patient was provided an opportunity to ask questions and all were answered. The patient agreed with the plan and demonstrated an understanding of the instructions.   The patient was advised to call back or seek an in-person evaluation if the symptoms worsen or if the condition fails to improve as anticipated.  I provided 21 minutes of non-face-to-face time during this encounter.  Elsie Stain, MD

## 2020-05-28 NOTE — Telephone Encounter (Signed)
I received a call from [place] with [facility], requesting VO for SN 1x a week for 1 week, 2x a week for 1 week, and 1x a week for 7 weeks, as well as 3 PRN visits.  Patient did refuse [place] aide services. [name] can be reached back at [phone]

## 2020-05-31 ENCOUNTER — Telehealth: Payer: Self-pay | Admitting: Family Medicine

## 2020-05-31 NOTE — Telephone Encounter (Signed)
Please check to see if home health can recheck BMET and CBC this week.   Dx: N30.00.  If they cannot do it then please let me know.  Thanks.

## 2020-05-31 NOTE — Assessment & Plan Note (Signed)
UTI, status post inpatient treatment and she is going to complete her current antibiotics.  No change in medications at this point.  We talked about maintaining her bowel regimen and she is going to follow-up with the GI clinic as previously scheduled regarding weight loss evaluation. We talked about getting HH to recheck BMET and CBC next week.  See follow-up phone note No change in medications at this point.  She agrees

## 2020-06-01 ENCOUNTER — Encounter: Payer: Self-pay | Admitting: *Deleted

## 2020-06-01 ENCOUNTER — Telehealth: Payer: Self-pay

## 2020-06-01 NOTE — Telephone Encounter (Signed)
I received a call from [facility] with [facility], and she states that she needs VO for PT 2x a week for 2 weeks, and 1x a week for 2 weeks. She also states that patient is so scared of getting in the shower, as she does not want to fall, and she feels patient would benefit from a home health aide 2x a week. Dr. [name], is this ok?

## 2020-06-01 NOTE — Telephone Encounter (Signed)
[name] with [facility] advised.  Patient does have shower chair that she can use with an aide.

## 2020-06-01 NOTE — Telephone Encounter (Signed)
Order written and faxed.

## 2020-06-01 NOTE — Telephone Encounter (Signed)
Please give the order.  Thanks.  Does she have a shower chair?  If not, would that help?  I can write up an order if needed.  Please let me know.

## 2020-06-02 ENCOUNTER — Telehealth: Payer: Self-pay

## 2020-06-02 DIAGNOSIS — Z5989 Other problems related to housing and economic circumstances: Secondary | ICD-10-CM

## 2020-06-02 NOTE — Telephone Encounter (Signed)
I put in a referral to [facility] to see if they can help with this.  Thanks.

## 2020-06-02 NOTE — Telephone Encounter (Signed)
Pt called and just got a letter from Medicare with some issues with pts coverage with part C & D. Pt thinks the coverage is being paid thru a draft at the bank; pt wants to know why ins is stopping paying for some of her meds and what can be done about it. Pt said she has no family or friends that can help her with this type thing; pts sister in law Ms Prescott Parma that did help her has passed away. Pt said Dr [name] should be receiving some paperwork from medicare about this issue with pts meds and ins not approving. Pt wants to know what to do in the meantime. I advised pt to ck with her pharmacy [facility] to see if they can help with her questions about medicines and pt to contact her bank to see if she has a Landscape architect or someone that can help her with the questions about drafts from pts checking acct.  FYI only to Dr [name].

## 2020-06-02 NOTE — Telephone Encounter (Signed)
Ria Comment called to verify [facility] skilled nursing orders on 05/28/20. Reviewed vo HH nursing request  From 05/28/20 and Ria Comment said that was all she needed.

## 2020-06-03 ENCOUNTER — Telehealth: Payer: Self-pay

## 2020-06-03 NOTE — Telephone Encounter (Signed)
Esther OT with [facility] left message for verbal orders for OT 1x week for 1 week and 2 x week for 2 weeks. Please call with orders [phone]

## 2020-06-03 NOTE — Telephone Encounter (Signed)
[place] and spoke with Caryl Pina who reports the only medications the pt has gotten that insurance has not paid for is ativan because it is a benzo and zofran because it was over the amount they would cover. She is not sure what the pt is talking about that is not being covered.   Tried to contact pt but line is busy

## 2020-06-03 NOTE — Telephone Encounter (Signed)
Please give the order.  Thanks.   

## 2020-06-04 NOTE — Telephone Encounter (Signed)
[name] has been notified and verbalized understanding,.

## 2020-06-08 MED ORDER — TRIAMCINOLONE ACETONIDE 0.1 % EX CREA
1.0000 | TOPICAL_CREAM | Freq: Two times a day (BID) | CUTANEOUS | 1 refills | Status: DC | PRN
Start: 2020-06-08 — End: 2020-08-12

## 2020-06-08 NOTE — Telephone Encounter (Signed)
Pt reports that she was talking about a hospital bill and did not have anything to do with Dr. [name] or her pharmacy medications. She did request while on the  Phone a cream for her itchy skin. She reports she has been using an OTC, Jergens, for sometime but not getting much relief. She said the itching is mostly on her buttocks but her entire body is dry and itchy a lot of the time. She said they gave her something for her buttocks in the hospital for her buttocks the last time she was in but she could not remember what it was. Advised a msg would be sent to PCP and this office would f/u. Pt verbalized understanding.

## 2020-06-08 NOTE — Addendum Note (Signed)
Addended by: [name] on: 06/08/2020 05:08 PM   Modules accepted: Orders

## 2020-06-08 NOTE — Telephone Encounter (Addendum)
I would try triamcinolone prn for itchy skin on buttock.  Use only if needed.  rx sent.  If not better, then let me know.  She had intolerance to oral cortisone but should be able to tolerate triamcinolone cream.  Thanks.

## 2020-06-09 NOTE — Telephone Encounter (Signed)
Advised pt of cream and if any symptom worsened to contact office. Pt verbalized understanding.

## 2020-06-09 NOTE — Telephone Encounter (Signed)
Thanks

## 2020-06-22 DIAGNOSIS — K2270 Barrett's esophagus without dysplasia: Secondary | ICD-10-CM

## 2020-06-22 DIAGNOSIS — K449 Diaphragmatic hernia without obstruction or gangrene: Secondary | ICD-10-CM

## 2020-06-22 DIAGNOSIS — E785 Hyperlipidemia, unspecified: Secondary | ICD-10-CM

## 2020-06-22 DIAGNOSIS — I10 Essential (primary) hypertension: Secondary | ICD-10-CM | POA: Diagnosis not present

## 2020-06-22 DIAGNOSIS — F329 Major depressive disorder, single episode, unspecified: Secondary | ICD-10-CM

## 2020-06-22 DIAGNOSIS — H9190 Unspecified hearing loss, unspecified ear: Secondary | ICD-10-CM

## 2020-06-22 DIAGNOSIS — Z85828 Personal history of other malignant neoplasm of skin: Secondary | ICD-10-CM

## 2020-06-22 DIAGNOSIS — M810 Age-related osteoporosis without current pathological fracture: Secondary | ICD-10-CM

## 2020-06-22 DIAGNOSIS — N3000 Acute cystitis without hematuria: Secondary | ICD-10-CM | POA: Diagnosis not present

## 2020-06-22 DIAGNOSIS — Z8719 Personal history of other diseases of the digestive system: Secondary | ICD-10-CM

## 2020-06-22 DIAGNOSIS — K5909 Other constipation: Secondary | ICD-10-CM

## 2020-06-22 DIAGNOSIS — E039 Hypothyroidism, unspecified: Secondary | ICD-10-CM | POA: Diagnosis not present

## 2020-06-22 DIAGNOSIS — F419 Anxiety disorder, unspecified: Secondary | ICD-10-CM

## 2020-06-22 DIAGNOSIS — K219 Gastro-esophageal reflux disease without esophagitis: Secondary | ICD-10-CM

## 2020-06-22 DIAGNOSIS — Z9049 Acquired absence of other specified parts of digestive tract: Secondary | ICD-10-CM

## 2020-06-22 DIAGNOSIS — Z86010 Personal history of colonic polyps: Secondary | ICD-10-CM

## 2020-06-22 DIAGNOSIS — Z87310 Personal history of (healed) osteoporosis fracture: Secondary | ICD-10-CM

## 2020-06-22 DIAGNOSIS — Z90710 Acquired absence of both cervix and uterus: Secondary | ICD-10-CM

## 2020-06-22 DIAGNOSIS — H532 Diplopia: Secondary | ICD-10-CM

## 2020-06-22 DIAGNOSIS — Z79891 Long term (current) use of opiate analgesic: Secondary | ICD-10-CM

## 2020-06-22 DIAGNOSIS — Z85850 Personal history of malignant neoplasm of thyroid: Secondary | ICD-10-CM

## 2020-06-22 DIAGNOSIS — Z9181 History of falling: Secondary | ICD-10-CM

## 2020-06-22 DIAGNOSIS — B961 Klebsiella pneumoniae [K. pneumoniae] as the cause of diseases classified elsewhere: Secondary | ICD-10-CM | POA: Diagnosis not present

## 2020-06-25 ENCOUNTER — Ambulatory Visit: Payer: Medicare Other | Admitting: Gastroenterology

## 2020-07-16 ENCOUNTER — Telehealth: Payer: Self-pay

## 2020-07-16 NOTE — Telephone Encounter (Deleted)
07/16/20 Left message on voicemail for patient to return my call regarding community resource for filing Medicare Extrahelp, [employer] [place] [employer]) counselor.  Will call again next week. Ambrose Mantle [phone]

## 2020-07-19 ENCOUNTER — Other Ambulatory Visit: Payer: Self-pay | Admitting: Family Medicine

## 2020-07-19 NOTE — Telephone Encounter (Signed)
    MA8/23/2021 2nd Attempt  Name: [name]   MRN: [id number]   DOB: 1930-07-05   AGE: 86 y.o.   GENDER: female   PCP [name], MD.   07/19/20 Left message on voicemail for patient to return my call regarding community resource for filing Medicare Extrahelp, [employer] [place] [employer]) counselor.  Will attempt to call again 07/20/20.   Kristoffer Bala, AAS Paralegal, [place] . Embedded Care Coordination [facility] Health  Care Management  [address], [place] [address] [name]  [phone]   [email or web address]

## 2020-07-19 NOTE — Telephone Encounter (Signed)
Request came in today.  rx sent for both.  Please notify pt.  Thanks.

## 2020-07-19 NOTE — Telephone Encounter (Signed)
[name] with [facility] left v/m to ck on status of lorazepam and ondansetron refills requested earlier today; pt is out of med.  Name of Medication: lorazepam 0.5 mg Name of [place] or Written Date and Quantity: # 40 x 1 04/30/2020 Last seen: phone visit [facility]U on 05/28/20. Next appt; none scheduled.  Name of Medication: ondansetron 4 mg Name of [place] or Written Date and Quantity: # 20 on 05/14/20 by Dr Delman Kitten MD Last seen: phone visit [facility]U on 05/28/20. Next appt; none scheduled.

## 2020-07-20 NOTE — Telephone Encounter (Signed)
    MA8/24/2021  Name: [name]   MRN: [id number]   DOB: [birth year]   AGE: 86 y.o.   GENDER: female   PCP [name], MD.   07/20/20 Spoke with patient about applying for Medicare Extrahelp through the [employer] counselor. Will follow-up with patient by 07/23/20.    Ticia Virgo, AAS Paralegal, [place] . Embedded Care Coordination [facility] Health  Care Management  [address], [place] [address] [email or web address]  [phone]   [email or web address]

## 2020-07-21 NOTE — Telephone Encounter (Signed)
Thanks

## 2020-07-26 NOTE — Telephone Encounter (Signed)
    MA8/30/2021 Follow-up  Name: [name]   MRN: [id number]   DOB: 12-30-1929   AGE: 86 y.o.   GENDER: female   PCP [name], MD.   07/26/20 Follow-up call with patient about calling local [facility] counselor at [facility] [place] to get information and sign up for Medicare Extrahelp.  Patient stated she plans on calling this week and she did not need assistance.  No other resources needed. Closing referral.    Penni Penado, AAS Paralegal, [place] . Embedded Care Coordination [facility] Health  Care Management  [address], [place] [address] [email or web address]  [phone]   [email or web address]

## 2020-07-29 ENCOUNTER — Ambulatory Visit (INDEPENDENT_AMBULATORY_CARE_PROVIDER_SITE_OTHER): Payer: Medicare Other | Admitting: Family Medicine

## 2020-07-29 ENCOUNTER — Encounter: Payer: Self-pay | Admitting: Family Medicine

## 2020-07-29 ENCOUNTER — Other Ambulatory Visit: Payer: Self-pay

## 2020-07-29 DIAGNOSIS — H6120 Impacted cerumen, unspecified ear: Secondary | ICD-10-CM

## 2020-07-29 DIAGNOSIS — R634 Abnormal weight loss: Secondary | ICD-10-CM
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
## 2020-07-29 NOTE — Patient Instructions (Signed)
It should be okay to try melatonin and ginko at night, but I don't know if it will help with the ear ringing.  Let me check with the GI clinic about your weight.   We'll be in touch.  Take care.  Glad to see you.

## 2020-08-02 ENCOUNTER — Telehealth: Payer: Self-pay | Admitting: Family Medicine

## 2020-08-02 NOTE — Telephone Encounter (Signed)
Hi. We had her come in for fecal pancreatic elastase which was normal, so that would argue against pancreatic exocrine deficiency despite the appearance of her CT scan. In that light, with normal elastase it would make it less likely that Creon would help her. I haven't seen her in over a year and a half, she was scheduled to see me in July and I don't think she came in for it. I'll have our staff reach out to her to schedule a follow up with me to discus other options. Thanks  Jan can you help schedule this patient for a follow up with me? Thanks

## 2020-08-02 NOTE — Telephone Encounter (Signed)
This patient's weight loss continues and she has atrophic changes of the pancreas per CT.  What are your thoughts on a trial of Creon?  If you have other suggestions then I would greatly appreciate them.  Thank you very much.

## 2020-08-02 NOTE — Assessment & Plan Note (Signed)
FeltSignificantly improved with curette extraction and irrigation.  Tolerated well.  Her ear her afterwards and her hearing was better.  She can update me as needed.  See above.

## 2020-08-02 NOTE — Assessment & Plan Note (Signed)
With recent weight loss noted.  See follow-up phone note.  I want GI input about preemptive treatment with Creon versus any input that they have.

## 2020-08-02 NOTE — Telephone Encounter (Signed)
Many thanks. 

## 2020-08-03 NOTE — Telephone Encounter (Signed)
Called and LM for pt to call back to reschedule for a sooner date with APP.

## 2020-08-03 NOTE — Telephone Encounter (Signed)
Yes if there are any openings with APP sooner that would be better. Thanks

## 2020-08-03 NOTE — Telephone Encounter (Signed)
Called pt and scheduled her for an appt on your next available, 11-1, and placed on the wait list for an earlier opening.  Would you like pt to been seen sooner by APP or await a cancellation on your schedule? Please advise.

## 2020-08-04 NOTE — Telephone Encounter (Signed)
Rescheduled pt for next Thursday, 9-16 at 2:00pm. Left detailed message with appt information.Asked her to call back if not able to keep this appt

## 2020-08-11 ENCOUNTER — Other Ambulatory Visit: Payer: Self-pay

## 2020-08-11 MED ORDER — HYDROCODONE-ACETAMINOPHEN 5-325 MG PO TABS: 1.0000 | ORAL_TABLET | Freq: Two times a day (BID) | ORAL | 0 refills | Status: DC

## 2020-08-11 NOTE — Telephone Encounter (Signed)
Sent. Thanks.   

## 2020-08-11 NOTE — Telephone Encounter (Signed)
Name of Medication: Hydrocodone apap 5-325 mg Name of Pharmacy: [facility] or Written Date and Quantity:# 180 on 04/22/20  Last Office Visit and Type: 07/29/20 earache and 05/07/20 ck meds Next Office Visit and Type:none scheduled  Last Controlled Substance Agreement Date: 07/19/2018 Last UDS:05/04/2020  Pt said she has 5 pills left.

## 2020-08-12 ENCOUNTER — Encounter: Payer: Self-pay | Admitting: Gastroenterology

## 2020-08-12 ENCOUNTER — Ambulatory Visit (INDEPENDENT_AMBULATORY_CARE_PROVIDER_SITE_OTHER): Payer: Medicare Other | Admitting: Gastroenterology

## 2020-08-12 VITALS — BP 110/80 | HR 84 | Ht 61.5 in | Wt 86.4 lb

## 2020-08-12 DIAGNOSIS — R634 Abnormal weight loss: Secondary | ICD-10-CM | POA: Diagnosis not present

## 2020-08-12 DIAGNOSIS — R1013 Epigastric pain: Secondary | ICD-10-CM
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
## 2020-08-12 NOTE — Patient Instructions (Addendum)
If you are age 84 or older, your body mass index should be between 23-30. Your Body mass index is 16.06 kg/m. If this is out of the aforementioned range listed, please consider follow up with your Primary Care Provider.  If you are age 43 or younger, your body mass index should be between 19-25. Your Body mass index is 16.06 kg/m. If this is out of the aformentioned range listed, please consider follow up with your Primary Care Provider.   Restart Omeprazole 20-30 minutes before dinner.  Add protein shake each day.   Call back with an update in 4 weeks, ask to speak with Koren Shiver, RN.

## 2020-08-13 NOTE — Progress Notes (Signed)
Agree with assessment and plan as outlined.  

## 2020-09-13 ENCOUNTER — Other Ambulatory Visit: Payer: Self-pay | Admitting: Family Medicine

## 2020-09-13 NOTE — Telephone Encounter (Signed)
Sent. Thanks.   

## 2020-09-13 NOTE — Telephone Encounter (Signed)
Electronic refill request. Ondansetron Last office visit:   07/29/2020 Last Filled:    20 tablet 1 07/19/2020

## 2020-09-14 DIAGNOSIS — Z0120 Encounter for dental examination and cleaning without abnormal findings: Secondary | ICD-10-CM | POA: Diagnosis not present

## 2020-09-17 ENCOUNTER — Emergency Department (HOSPITAL_COMMUNITY)
Admission: EM | Admit: 2020-09-17 | Discharge: 2020-09-17 | Disposition: A | Discharge status: Home / Self Care | Payer: Medicare Other | Attending: Emergency Medicine | Admitting: Emergency Medicine

## 2020-09-17 ENCOUNTER — Other Ambulatory Visit: Payer: Self-pay

## 2020-09-17 ENCOUNTER — Emergency Department (HOSPITAL_COMMUNITY): Payer: Medicare Other

## 2020-09-17 ENCOUNTER — Encounter (HOSPITAL_COMMUNITY): Payer: Self-pay

## 2020-09-17 DIAGNOSIS — J90 Pleural effusion, not elsewhere classified: Secondary | ICD-10-CM | POA: Diagnosis not present

## 2020-09-17 DIAGNOSIS — M25552 Pain in left hip: Secondary | ICD-10-CM | POA: Diagnosis not present

## 2020-09-17 DIAGNOSIS — Z79899 Other long term (current) drug therapy: Secondary | ICD-10-CM | POA: Diagnosis not present

## 2020-09-17 DIAGNOSIS — Z85850 Personal history of malignant neoplasm of thyroid: Secondary | ICD-10-CM | POA: Insufficient documentation

## 2020-09-17 DIAGNOSIS — R079 Chest pain, unspecified: Secondary | ICD-10-CM | POA: Diagnosis not present

## 2020-09-17 DIAGNOSIS — Z85828 Personal history of other malignant neoplasm of skin: Secondary | ICD-10-CM | POA: Insufficient documentation

## 2020-09-17 DIAGNOSIS — I499 Cardiac arrhythmia, unspecified: Secondary | ICD-10-CM | POA: Diagnosis not present

## 2020-09-17 DIAGNOSIS — R52 Pain, unspecified: Secondary | ICD-10-CM | POA: Diagnosis not present

## 2020-09-17 DIAGNOSIS — E039 Hypothyroidism, unspecified: Secondary | ICD-10-CM | POA: Insufficient documentation

## 2020-09-17 DIAGNOSIS — Z79890 Hormone replacement therapy (postmenopausal): Secondary | ICD-10-CM | POA: Diagnosis not present

## 2020-09-17 DIAGNOSIS — W19XXXA Unspecified fall, initial encounter: Secondary | ICD-10-CM | POA: Insufficient documentation

## 2020-09-17 DIAGNOSIS — R55 Syncope and collapse: Secondary | ICD-10-CM | POA: Diagnosis not present

## 2020-09-17 DIAGNOSIS — R197 Diarrhea, unspecified: Secondary | ICD-10-CM | POA: Insufficient documentation

## 2020-09-17 DIAGNOSIS — R0902 Hypoxemia: Secondary | ICD-10-CM | POA: Diagnosis not present

## 2020-09-17 DIAGNOSIS — I10 Essential (primary) hypertension: Secondary | ICD-10-CM | POA: Diagnosis not present

## 2020-09-17 LAB — CBC WITH DIFFERENTIAL/PLATELET
Abs Immature Granulocytes: 0.06 10*3/uL (ref 0.00–0.07)
Basophils Absolute: 0 10*3/uL (ref 0.0–0.1)
Basophils Relative: 0 %
Eosinophils Absolute: 0 10*3/uL (ref 0.0–0.5)
Eosinophils Relative: 0 %
HCT: 41.2 % (ref 36.0–46.0)
Hemoglobin: 13.6 g/dL (ref 12.0–15.0)
Immature Granulocytes: 1 %
Lymphocytes Relative: 5 %
Lymphs Abs: 0.6 10*3/uL — ABNORMAL LOW (ref 0.7–4.0)
MCH: 31.4 pg (ref 26.0–34.0)
MCHC: 33 g/dL (ref 30.0–36.0)
MCV: 95.2 fL (ref 80.0–100.0)
Monocytes Absolute: 0.6 10*3/uL (ref 0.1–1.0)
Monocytes Relative: 5 %
Neutro Abs: 11.4 10*3/uL — ABNORMAL HIGH (ref 1.7–7.7)
Neutrophils Relative %: 89 %
Platelets: 218 10*3/uL (ref 150–400)
RBC: 4.33 MIL/uL (ref 3.87–5.11)
RDW: 12.2 % (ref 11.5–15.5)
WBC: 12.7 10*3/uL — ABNORMAL HIGH (ref 4.0–10.5)
nRBC: 0 % (ref 0.0–0.2)

## 2020-09-17 LAB — COMPREHENSIVE METABOLIC PANEL
ALT: 14 U/L (ref 0–44)
AST: 30 U/L (ref 15–41)
Albumin: 4.1 g/dL (ref 3.5–5.0)
Alkaline Phosphatase: 57 U/L (ref 38–126)
Anion gap: 13 (ref 5–15)
BUN: 18 mg/dL (ref 8–23)
CO2: 28 mmol/L (ref 22–32)
Calcium: 9.1 mg/dL (ref 8.9–10.3)
Chloride: 95 mmol/L — ABNORMAL LOW (ref 98–111)
Creatinine, Ser: 1.03 mg/dL — ABNORMAL HIGH (ref 0.44–1.00)
GFR, Estimated: 52 mL/min — ABNORMAL LOW (ref >60)
Glucose, Bld: 119 mg/dL — ABNORMAL HIGH (ref 70–99)
Potassium: 4.7 mmol/L (ref 3.5–5.1)
Sodium: 136 mmol/L (ref 135–145)
Total Bilirubin: 1.4 mg/dL — ABNORMAL HIGH (ref 0.3–1.2)
Total Protein: 7.6 g/dL (ref 6.5–8.1)

## 2020-09-17 LAB — TROPONIN I (HIGH SENSITIVITY)
Troponin I (High Sensitivity): 8 ng/L (ref <18)
Troponin I (High Sensitivity): 7 ng/L (ref <18)

## 2020-09-17 MED ORDER — SODIUM CHLORIDE 0.9 % IV BOLUS
500.0000 mL | Freq: Once | INTRAVENOUS | Status: AC
  Administered 2020-09-17: 500 mL via INTRAVENOUS

## 2020-09-17 NOTE — ED Triage Notes (Signed)
Pt began to feel a sharp jaw pain after sitting down to eat a sanwhich earlier. Pt then went to the bathroom and had an explosive bowel movement before falling to the floor. Fall was unwitnessed but pt now reports pain in neck, left  hip, and left side of chest. Neck tenderness noted. Pt placed in C-collar. EKG showed ST depression. Pt is not on any blood thinners.   EMS vitals:  SPO2 94 % RA HR 60 BP 133/73 CBG 175

## 2020-09-17 NOTE — ED Provider Notes (Addendum)
[facility] DEPT Provider Note   CSN: [id number] Arrival date & time: 09/17/20  1543     History Chief Complaint  Patient presents with  . Fall    [name] is a 86 y.o. female.  Patient states she had some pain in her jaw when she was chewing and she went to the bathroom and she had diarrhea and then passed out.  Patient feels fine now  The history is provided by the patient. No language interpreter was used.  Fall This is a new problem. The current episode started 6 to 12 hours ago. The problem occurs constantly. The problem has not changed since onset.Pertinent negatives include no chest pain, no abdominal pain and no headaches. Nothing aggravates the symptoms. Nothing relieves the symptoms. She has tried nothing for the symptoms. The treatment provided no relief.       Past Medical History:  Diagnosis Date  . Anxiety   . Barrett esophagus   . Basal cell carcinoma   . Diplopia    history of,hospital 2/26-2/27/10  . Diverticulitis    colonoscopy 1995.  CT 01/31/09, 02/02/09  . Hard of hearing   . Hiatal hernia   . Hiatal hernia    EGD 1998, 2007 ( Dr. Sharlett Iles)  . Hyperlipemia   . Hypertension   . Hypothyroidism   . Osteoporosis DXA 2015  . Personal history of colonic polyps    colonoscpy, removed 1995  . Thyroid carcinoma ([place]) 01/1994  . UTI (urinary tract infection)    Chronic    Patient Active Problem List   Diagnosis Date Noted  . UTI (urinary tract infection) 05/25/2020  . Acute cystitis 05/25/2020  . Vitamin D deficiency 05/11/2019  . Impacted stool in rectum ([place]) 03/06/2019  . Skin tear of forearm without complication 11/94/1740  . Encounter for chronic pain management 11/14/2017  . Health care maintenance 09/26/2017  . Dysuria 07/23/2017  . Near syncope 03/18/2017  . Throat pain 11/03/2016  . Loss of weight 09/20/2016  . Insomnia 03/10/2015  . Osteoporosis 11/01/2014  . Medicare annual wellness visit,  subsequent 08/25/2014  . Advance care planning 08/25/2014  . Anxiety state 10/23/2012  . Cerumen impaction 06/03/2012  . IBS (irritable bowel syndrome) 04/18/2011  . GERD (gastroesophageal reflux disease) 04/18/2011  . Barrett esophagus 04/18/2011  . MULTIPLE CRANIAL NERVE PALSIES 01/29/2009  . CONVERGENCE INSUFF/PALSY BINOCULAR EYE MOVEMENT 01/22/2009  . KERATOACANTHOMA 11/18/2007  . Abdominal pain, epigastric 11/03/2007  . Hypothyroidism 02/15/2007  . HLD (hyperlipidemia) 02/15/2007  . Essential hypertension 02/15/2007  . MITRAL VALVE PROLAPSE 02/15/2007  . ATRIAL FIBRILLATION, PAROXYSMAL 02/15/2007  . DIVERTICULOSIS, COLON 02/15/2007  . POSTMENOPAUSAL STATUS 02/15/2007  . COLONIC POLYPS, HX OF 02/15/2007  . Depression 05/27/1998  . THYROID CANCER, HX OF 02/25/1994    Past Surgical History:  Procedure Laterality Date  . ABDOMINAL HYSTERECTOMY    . CHOLECYSTECTOMY    . HIP SURGERY  08/1993   due to fracture of right hip  . SKIN CANCER EXCISION     basal cell left hand, forehead  . THYROIDECTOMY  04/1994   thyroid cancer     OB History   No obstetric history on file.     Family History  Problem Relation Age of Onset  . Cancer Brother        Pharyngeal   . Heart disease Brother        MI  . Diabetes Brother   . Diabetes Sister  amputation  . Hypertension Mother   . Stroke Father   . Kidney cancer Neg Hx   . Bladder Cancer Neg Hx     Social History   Tobacco Use  . Smoking status: Never Smoker  . Smokeless tobacco: Never Used  Substance Use Topics  . Alcohol use: No    Alcohol/week: 0.0 standard drinks  . Drug use: No    Home Medications Prior to Admission medications   Medication Sig Start Date End Date Taking? Authorizing Provider  Artificial Saliva (BIOTENE DRY MOUTH MOISTURIZING) SOLN Use as directed 5 mLs in the mouth or throat daily as needed (For dry mouth).    Yes [provider]  ascorbic acid (VITAMIN C) 500 MG tablet Take  500 mg by mouth daily.   Yes [provider]  CHLORDIAZEPOXIDE-CLIDINIUM PO Take 1 tablet by mouth daily as needed (anxiety).   Yes [provider]  Cholecalciferol (CVS D3) 25 MCG (1000 UT) capsule Take 1,000 Units by mouth daily.   Yes [provider]  ENSURE PLUS (ENSURE PLUS) LIQD Take 237 mLs by mouth 2 (two) times daily between meals.    Yes [provider]  HYDROcodone-acetaminophen (NORCO/VICODIN) 5-325 MG tablet Take 1 tablet by mouth 2 (two) times daily. 08/11/20  Yes [name], MD  levothyroxine (SYNTHROID) 88 MCG tablet TAKE 1 TABLET BY MOUTH ONCE A DAY. TAKE ON AN EMPTY STOMACH WITH A GLASS OF WATER AT LEAST 30-60 MIN BEFORE BREAKFAST Patient taking differently: Take 88 mcg by mouth daily before breakfast.  10/28/19  Yes [name], MD  lisinopril (ZESTRIL) 10 MG tablet TAKE 1 TABLET BY MOUTH DAILY AT BEDTIME Patient taking differently: Take 10 mg by mouth daily.  03/25/20  Yes [name], MD  loratadine (CVS ALLERGY RELIEF) 10 MG dissolvable tablet Take 10 mg by mouth daily as needed for allergies.    Yes [provider]  LORazepam (ATIVAN) 0.5 MG tablet TAKE 1/2 TO 1 TABLET BY MOUTH TWICE DAILY AS NEEDED FOR ANXIETY Patient taking differently: Take 0.5 mg by mouth daily as needed for anxiety or sleep.  07/19/20  Yes [name], MD  mirtazapine (REMERON) 15 MG tablet TAKE ONE TABLET BY MOUTH AT BEDTIME Patient taking differently: Take 15 mg by mouth at bedtime.  01/02/20  Yes [name], MD  Multiple Vitamin (MULTIVITAMIN) capsule Take 1 capsule by mouth daily.   Yes [provider]  NON FORMULARY Take 1 capsule by mouth daily. Nutraflora  Probiotic    Yes [provider]  omeprazole (PRILOSEC) 40 MG capsule TAKE 1 CAPSULE BY MOUTH EVERY MORNING 45MINUTES BEFORE BREAKFAST Patient taking differently: Take 40 mg by mouth daily.  05/03/20  Yes [name], MD  ondansetron (ZOFRAN-ODT) 4 MG  disintegrating tablet TAKE 1 TABLET BY MOUTH EVERY 6 HOURS AS NEEDED FOR NAUSEA OR VOMITING Patient taking differently: Take 4 mg by mouth every 8 (eight) hours as needed for nausea or vomiting.  09/13/20  Yes [name], MD  polyethylene glycol powder (MIRALAX) 17 GM/SCOOP powder Take 17 g by mouth 2 (two) times daily as needed. Patient taking differently: Take 17-34 g by mouth daily as needed for mild constipation.  03/06/19  Yes [name], MD    Allergies    Bentyl [dicyclomine hcl], Ciprofloxacin, Cortisone acetate, Doxycycline, Gabapentin, Metoclopramide hcl, Penicillins, Promethazine hcl, Remeron [mirtazapine], and Septra [sulfamethoxazole-trimethoprim]  Review of Systems   Review of Systems  Constitutional: Negative for appetite  change and fatigue.  HENT: Negative for congestion, ear discharge and sinus pressure.   Eyes: Negative for discharge.  Respiratory: Negative for cough.   Cardiovascular: Negative for chest pain.  Gastrointestinal: Negative for abdominal pain and diarrhea.       Diarrhea  Genitourinary: Negative for frequency and hematuria.  Musculoskeletal: Negative for back pain.  Skin: Negative for rash.  Neurological: Negative for seizures and headaches.       Syncope  Psychiatric/Behavioral: Negative for hallucinations.    Physical Exam Updated Vital Signs BP (!) 165/86 (BP Location: Right Arm)   Pulse 89   Temp 97.6 F (36.4 C) (Oral)   Resp 20   Ht 5' 1.5" (1.562 m)   Wt 39.2 kg   LMP 03/28/1989   SpO2 99%   BMI 16.06 kg/m   Physical Exam Vitals and nursing note reviewed.  Constitutional:      Appearance: She is well-developed.  HENT:     Head: Normocephalic.     Nose: Nose normal.  Eyes:     General: No scleral icterus.    Conjunctiva/sclera: Conjunctivae normal.  Neck:     Thyroid: No thyromegaly.  Cardiovascular:     Rate and Rhythm: Normal rate and regular rhythm.     Heart sounds: No murmur heard.  No friction rub. No gallop.    Pulmonary:     Breath sounds: No stridor. No wheezing or rales.  Chest:     Chest wall: No tenderness.  Abdominal:     General: There is no distension.     Tenderness: There is no abdominal tenderness. There is no rebound.  Musculoskeletal:        General: Normal range of motion.     Cervical back: Neck supple.     Comments: Mild tenderness to left thigh  Lymphadenopathy:     Cervical: No cervical adenopathy.  Skin:    Findings: No erythema or rash.  Neurological:     Mental Status: She is alert and oriented to person, place, and time.     Motor: No abnormal muscle tone.     Coordination: Coordination normal.  Psychiatric:        Behavior: Behavior normal.     ED Results / Procedures / Treatments   Labs (all labs ordered are listed, but only abnormal results are displayed) Labs Reviewed  CBC WITH DIFFERENTIAL/PLATELET - Abnormal; Notable for the following components:      Result Value   WBC 12.7 (*)    Neutro Abs 11.4 (*)    Lymphs Abs 0.6 (*)    All other components within normal limits  COMPREHENSIVE METABOLIC PANEL - Abnormal; Notable for the following components:   Chloride 95 (*)    Glucose, Bld 119 (*)    Creatinine, Ser 1.03 (*)    Total Bilirubin 1.4 (*)    GFR, Estimated 52 (*)    All other components within normal limits  TROPONIN I (HIGH SENSITIVITY)  TROPONIN I (HIGH SENSITIVITY)    EKG None  Radiology DG Chest 2 View  Result Date: 09/17/2020 CLINICAL DATA:  Fall with anterior chest pain EXAM: CHEST - 2 VIEW COMPARISON:  03/10/2017 FINDINGS: Hyperinflation without focal opacity or pleural effusion. Stable cardiomediastinal silhouette. No pneumothorax. IMPRESSION: No active cardiopulmonary disease. Hyperinflation. Electronically Signed   By: Donavan Foil M.D.   On: 09/17/2020 17:37   DG Pelvis 1-2 Views  Result Date: 09/17/2020 CLINICAL DATA:  Unwitnessed fall with left hip pain EXAM: PELVIS - 1-2 VIEW  COMPARISON:  01/09/2017 FINDINGS: SI joints  are non widened. Pubic symphysis and rami appear intact. Threaded screw fixation of the right femur with similar appearance of hardware. No acute fracture or malalignment. IMPRESSION: No acute osseous abnormality. Electronically Signed   By: Donavan Foil M.D.   On: 09/17/2020 19:08   DG Femur Portable Min 2 Views Right  Result Date: 09/17/2020 CLINICAL DATA:  Fall, left hip pain EXAM: RIGHT FEMUR PORTABLE 2 VIEW COMPARISON:  None. FINDINGS: There is concern for possible left superior pubic ramus fracture. This would be better visualized with pelvic image. Mild degenerative changes in the left hip with joint space narrowing and spurring. No acute bony abnormality within the left femur. IMPRESSION: Question left superior pubic ramus fracture. Recommend dedicated pelvis image for further evaluation. Electronically Signed   By: Rolm Baptise M.D.   On: 09/17/2020 17:33    Procedures Procedures (including critical care time)  Medications Ordered in ED Medications  sodium chloride 0.9 % bolus 500 mL (0 mLs Intravenous Stopped 09/17/20 1832)    ED Course  I have reviewed the triage vital signs and the nursing notes.  Pertinent labs & imaging results that were available during my care of the patient were reviewed by me and considered in my medical decision making (see chart for details).    MDM Rules/Calculators/A&P                          Labs unremarkable.   Chest x-ray pelvic exam negative.  Patient with diarrhea and fall.  She will follow up with her doctor.  Patient feeling much better now Final Clinical Impression(s) / ED Diagnoses Final diagnoses:  Fall, initial encounter    Rx / DC Orders ED Discharge Orders    None       Milton Ferguson, MD 09/17/20 2215    Milton Ferguson, MD 09/17/20 2215

## 2020-09-17 NOTE — Discharge Instructions (Addendum)
Follow-up with your family doctor next week for recheck. 

## 2020-09-19 ENCOUNTER — Telehealth: Payer: Self-pay | Admitting: Family Medicine

## 2020-09-19 NOTE — Telephone Encounter (Signed)
Please check on patient s/p ER eval, please schedule f/u here.  Thanks.

## 2020-09-20 NOTE — Telephone Encounter (Signed)
Patient says she is doing better but was told to follow up with Dr. [name].  She had phone issues before we could make that appointment but she will call back to schedule.

## 2020-09-24 ENCOUNTER — Ambulatory Visit (INDEPENDENT_AMBULATORY_CARE_PROVIDER_SITE_OTHER): Payer: Medicare Other | Admitting: Family Medicine

## 2020-09-24 ENCOUNTER — Encounter: Payer: Self-pay | Admitting: Family Medicine

## 2020-09-24 ENCOUNTER — Other Ambulatory Visit: Payer: Self-pay

## 2020-09-24 VITALS — BP 132/86 | HR 99 | Temp 96.6°F | Ht 61.5 in | Wt 88.1 lb

## 2020-09-24 DIAGNOSIS — R197 Diarrhea, unspecified: Secondary | ICD-10-CM

## 2020-09-24 DIAGNOSIS — E039 Hypothyroidism, unspecified: Secondary | ICD-10-CM

## 2020-09-24 LAB — CBC WITH DIFFERENTIAL/PLATELET
Basophils Absolute: 0 10*3/uL (ref 0.0–0.1)
Basophils Relative: 0.4 % (ref 0.0–3.0)
Eosinophils Absolute: 0.1 10*3/uL (ref 0.0–0.7)
Eosinophils Relative: 2.4 % (ref 0.0–5.0)
HCT: 35.2 % — ABNORMAL LOW (ref 36.0–46.0)
Hemoglobin: 12 g/dL (ref 12.0–15.0)
Lymphocytes Relative: 22.7 % (ref 12.0–46.0)
Lymphs Abs: 1.1 10*3/uL (ref 0.7–4.0)
MCHC: 34.1 g/dL (ref 30.0–36.0)
MCV: 94 fl (ref 78.0–100.0)
Monocytes Absolute: 0.4 10*3/uL (ref 0.1–1.0)
Monocytes Relative: 8 % (ref 3.0–12.0)
Neutro Abs: 3.4 10*3/uL (ref 1.4–7.7)
Neutrophils Relative %: 66.5 % (ref 43.0–77.0)
Platelets: 212 10*3/uL (ref 150.0–400.0)
RBC: 3.74 Mil/uL — ABNORMAL LOW (ref 3.87–5.11)
RDW: 12.8 % (ref 11.5–15.5)
WBC: 5 10*3/uL (ref 4.0–10.5)

## 2020-09-24 LAB — COMPREHENSIVE METABOLIC PANEL
ALT: 14 U/L (ref 0–35)
AST: 22 U/L (ref 0–37)
Albumin: 4.1 g/dL (ref 3.5–5.2)
Alkaline Phosphatase: 58 U/L (ref 39–117)
BUN: 10 mg/dL (ref 6–23)
CO2: 35 mEq/L — ABNORMAL HIGH (ref 19–32)
Calcium: 8.8 mg/dL (ref 8.4–10.5)
Chloride: 98 mEq/L (ref 96–112)
Creatinine, Ser: 0.83 mg/dL (ref 0.40–1.20)
GFR: 63.07 mL/min (ref >60.00)
Glucose, Bld: 106 mg/dL — ABNORMAL HIGH (ref 70–99)
Potassium: 4.4 mEq/L (ref 3.5–5.1)
Sodium: 138 mEq/L (ref 135–145)
Total Bilirubin: 0.6 mg/dL (ref 0.2–1.2)
Total Protein: 7 g/dL (ref 6.0–8.3)

## 2020-09-24 LAB — TSH: TSH: 2.92 u[IU]/mL (ref 0.35–4.50)

## 2020-09-24 MED ORDER — OMEPRAZOLE 40 MG PO CPDR: DELAYED_RELEASE_CAPSULE | ORAL | Status: DC

## 2020-09-24 NOTE — Progress Notes (Signed)
This visit occurred during the SARS-CoV-2 public health emergency.  Safety protocols were in place, including screening questions prior to the visit, additional usage of staff PPE, and extensive cleaning of exam room while observing appropriate contact time as indicated for disinfecting solutions.  She felt well enough to go get her hair done, then came home.  She had some jaw pain (at the L TMJ) and neck pain, went to BR, then had diarrhea, then passed out briefly on standing.  She doesn't describe any post ictal sx.  Went to ER for eval with neg w/u.  She had some L leg numbness.  No more diarrhea but some loose stools.  She stopped miralax in the meantime- she thought that caused some abd discomfort.  She thought that taking prilosec helped with abd pain.  No other episode of syncope.  No CP. No SOB.  Not lightheaded on standing.   Walking with cane.  Parking form done for patient at [facility].    Meds, vitals, and allergies reviewed.   ROS: Per HPI unless specifically indicated in ROS section   GEN: nad, alert and oriented HEENT:ncat NECK: supple w/o LA CV: rrr PULM: ctab, no inc wob ABD: soft, +bs, epigastrum minimally ttp, improved with prilosec per patient report.  EXT: no edema SKIN: no acute rash

## 2020-09-24 NOTE — Patient Instructions (Signed)
Don't change your meds for now. Go to the lab on the way out.   If you have mychart we'll likely use that to update you.    Take care.  Glad to see you. 

## 2020-09-26 DIAGNOSIS — R197 Diarrhea, unspecified: Secondary | ICD-10-CM | POA: Insufficient documentation

## 2020-09-26 NOTE — Assessment & Plan Note (Signed)
She had an episode of diarrhea with possible vasovagal episode after that.  She thought that MiraLAX was contributing to the diarrhea so she wanted to stop that in the meantime.  Her previous abdominal discomfort had improved in the meantime taking Prilosec.  She is not lightheaded on standing and does not have new symptoms otherwise.  We talked about her previous work-up at the emergency room.  At this point, with the absence of other symptoms I think it makes sense to recheck her labs, have her stay off MiraLAX, continue omeprazole for now and update me if she notices other symptoms in the meantime.  She agrees with plan.

## 2020-09-27 ENCOUNTER — Ambulatory Visit: Payer: Medicare Other | Admitting: Gastroenterology

## 2020-09-29 ENCOUNTER — Other Ambulatory Visit: Payer: Self-pay | Admitting: Family Medicine

## 2020-10-03 NOTE — Telephone Encounter (Signed)
Sent. Thanks.   

## 2020-10-04 ENCOUNTER — Telehealth: Payer: Self-pay | Admitting: *Deleted

## 2020-10-04 NOTE — Telephone Encounter (Signed)
Patient left a voicemail stating that Dr. [name] has been treating her for her bowel problem. Patient stated that she is still having problems and wants to know if Dr. [name] will prescribe something else? Pharmacy :  [facility]

## 2020-10-05 NOTE — Telephone Encounter (Signed)
Pt called back due to returning a call

## 2020-10-05 NOTE — Telephone Encounter (Addendum)
Given her hx and weight loss, I need GI input.  I would continue prilosec for now and use miralax if needed.  Thanks.

## 2020-10-05 NOTE — Telephone Encounter (Signed)
lft VM to rtn call. Dm/cma  

## 2020-10-05 NOTE — Telephone Encounter (Signed)
Called patient and she states that she had stopped the Miralax for 1 week and still taking the Prilosec, but has since started the miralax back due to not having complete bowel movements.  Patient reports not letting her GI doctor know of her symptoms.   Please review and advise.   Thanks.   Dm/cma

## 2020-10-05 NOTE — Telephone Encounter (Signed)
Did she stop miralax and continue prilosec?  Did she notice any changes with that? What sx continue now? Did she update GI in the meantime?    Please let me know about the above.    I want to make sure GI know about this so I also routed this to [facility] in the meantime.  I thank all involved.

## 2020-10-05 NOTE — Telephone Encounter (Signed)
Patient notified VIA phone and will continue with instructions given.  No questions.  Dm/cma

## 2020-10-08 ENCOUNTER — Telehealth: Payer: Self-pay

## 2020-10-08 MED ORDER — PANCRELIPASE (LIP-PROT-AMYL) 36000-114000 UNITS PO CPEP: ORAL_CAPSULE | ORAL | 3 refills | Status: DC

## 2020-10-08 NOTE — Telephone Encounter (Signed)
Per Alonza Bogus, PA - please contact patient and let her know  that I spoke with Dr. [name] and we have decided to try some pancreatic enzymes to see if they help with her GI symptoms. Please tell her that we sent a prescription but won't know if they are covered until they process it. if they are not covered then we will see what we can do to get her some to try.  Called patient but phone number was busy.

## 2020-10-08 NOTE — Telephone Encounter (Signed)
Called patient 3x and home phone number is still busy.

## 2020-10-11 ENCOUNTER — Other Ambulatory Visit: Payer: Self-pay | Admitting: Family Medicine

## 2020-10-11 NOTE — Telephone Encounter (Addendum)
Refill request Levsin Medication is no longer on medication list Note from pharmacy shows D/C on 10/28/19 by Dr.Duncan Should medication be refilled?

## 2020-10-11 NOTE — Telephone Encounter (Signed)
I sent in the rx and also faxed form to [facility] nurse ambassador program for Creon

## 2020-10-12 NOTE — Telephone Encounter (Signed)
Called and spoke with patient. She states that she is still taking the medication to help with her recent stomach issues.

## 2020-10-12 NOTE — Telephone Encounter (Signed)
Please check with patient.  I thought she was off med.  Thanks.

## 2020-10-12 NOTE — Telephone Encounter (Signed)
Spoke with patient and informed her of plan. She will look out for a call from [facility] and if she has not heard from them by Monday she will call back.

## 2020-10-13 NOTE — Telephone Encounter (Signed)
Thanks

## 2020-10-13 NOTE — Telephone Encounter (Signed)
Called and spoke with patient regarding Dr. Josefine Class recommendations. Patient verbalized understanding. Patient stated that she has not picked up her prescription for her pancreatic enzymes from the pharmacy yet, but plans to do so. Overall, patient stated that her stomach is feeling better. Patient states that she is not taking the chlordiazepoxide. Patient to call with any further questions or concerns.

## 2020-10-13 NOTE — Telephone Encounter (Signed)
Noted, sent.  I wouldn't take this with chlordiazepoxide.  Did the pancreatic enzymes help her stomach symptoms? Please let me know. Thanks.

## 2020-10-29 ENCOUNTER — Ambulatory Visit (INDEPENDENT_AMBULATORY_CARE_PROVIDER_SITE_OTHER): Payer: Medicare Other | Admitting: Family Medicine

## 2020-10-29 ENCOUNTER — Other Ambulatory Visit: Payer: Self-pay

## 2020-10-29 DIAGNOSIS — Z659 Problem related to unspecified psychosocial circumstances: Secondary | ICD-10-CM | POA: Diagnosis not present

## 2020-10-29 DIAGNOSIS — R634 Abnormal weight loss: Secondary | ICD-10-CM | POA: Diagnosis not present

## 2020-10-29 NOTE — Progress Notes (Signed)
This visit occurred during the SARS-CoV-2 public health emergency.  Safety protocols were in place, including screening questions prior to the visit, additional usage of staff PPE, and extensive cleaning of exam room while observing appropriate contact time as indicated for disinfecting solutions.  Med f/u. Not yet on creon.  D/w pt.   I told her I would check with the GI clinic about potentially getting her started on Creon.  She was worried that someone was putting something in her home that smelled awful.  She hasn't seen any person doing it.  She had to temporarily move out to a hotel for a few nights, due to her concern.  She is not aware of any other specific threat to her health or wellbeing at this point.  Meds, vitals, and allergies reviewed.   ROS: Per HPI unless specifically indicated in ROS section   GEN: nad, alert and oriented HEENT: ncat NECK: supple w/o LA CV: rrr.  PULM: ctab, no inc wob ABD: soft, +bs EXT: no edema SKIN: no acute rash

## 2020-10-29 NOTE — Patient Instructions (Signed)
Let me check with the GI clinic about the creon pills.  We'll se about trying to get you some help with that.   Let me see about getting someone to help check on your house situation.  We'll be in touch.   Take care.  Glad to see you.

## 2020-10-31 DIAGNOSIS — Z659 Problem related to unspecified psychosocial circumstances: Secondary | ICD-10-CM | POA: Insufficient documentation

## 2020-10-31 NOTE — Assessment & Plan Note (Signed)
See above.  Not yet on Creon.  I am going to route this to GI to see if they have any other options available at this point to help her afford the medication.  I appreciate the help of all involved.

## 2020-10-31 NOTE — Assessment & Plan Note (Signed)
I am going to check to see if we can get someone to go to her house for a home evaluation.  Discussed.

## 2020-11-01 IMAGING — CT CT ABD-PELV W/ CM
2 of 5 series · 16 of 46 positions shown, 18 images · IV contrast (APPLIED)
Comparison: CT scan 05/14/2020

CLINICAL DATA: Failure to thrive.

EXAM:
CT ABDOMEN AND PELVIS WITH CONTRAST
TECHNIQUE: Multidetector CT imaging of the abdomen and pelvis was performed
using the standard protocol following bolus administration of
intravenous contrast.
CONTRAST:  75mL OMNIPAQUE IOHEXOL 300 MG/ML  SOLN

[Series 2: routine abd/pel with · axial · 0.61mm/px · z∈[-1158,-828]mm · 13 of 74 slices shown, 15 images]
[im 4/74  soft-tissue]
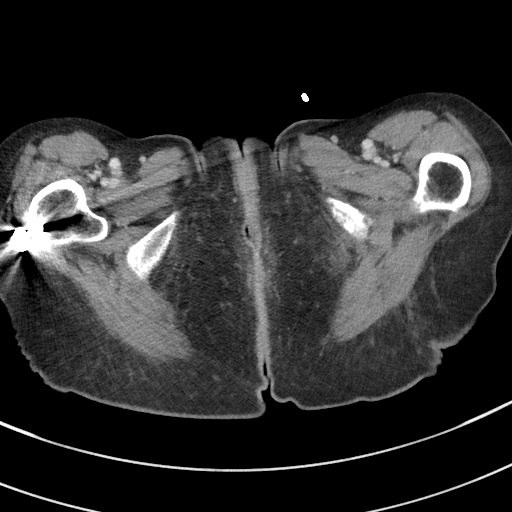
[im 4/74  bone]
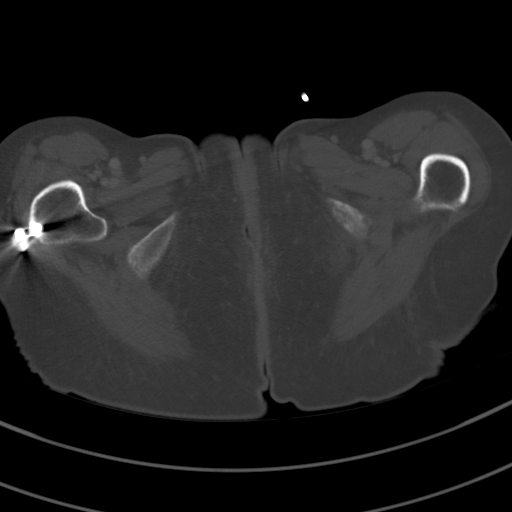
[im 12/74  soft-tissue]
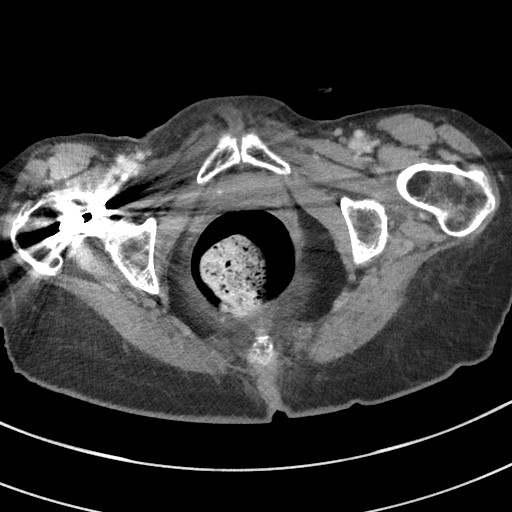
[im 16/74  soft-tissue]
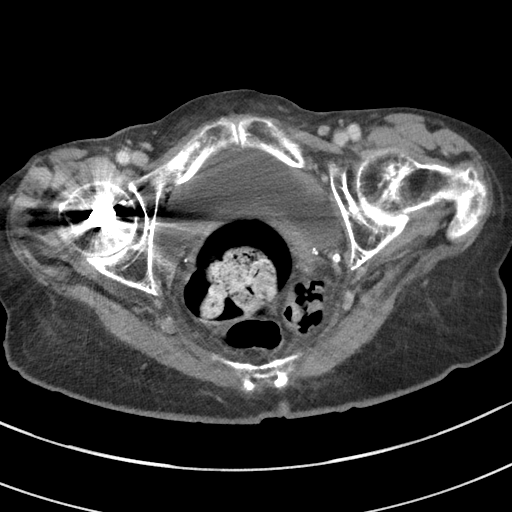
[im 20/74  soft-tissue]
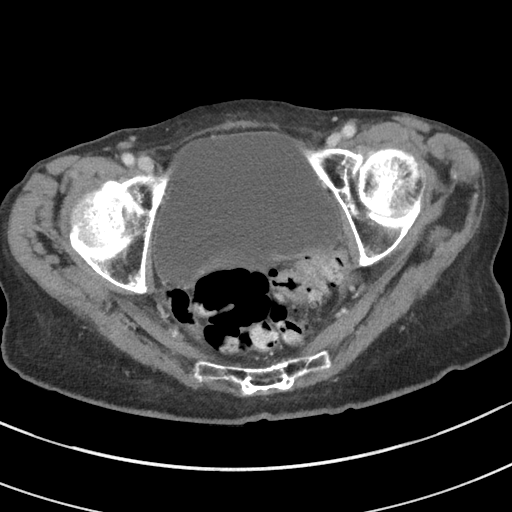
[im 27/74  soft-tissue]
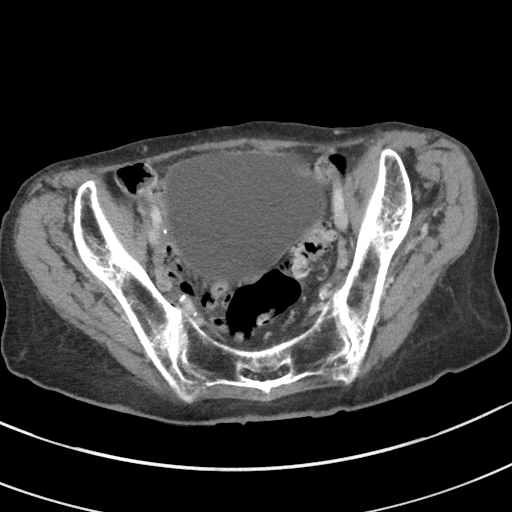
[im 31/74  soft-tissue]
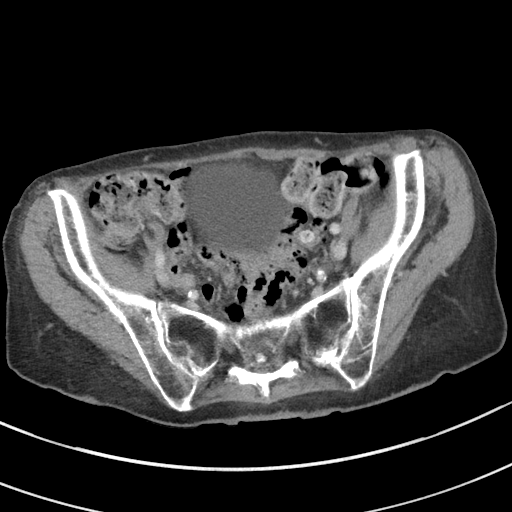
[im 39/74  soft-tissue]
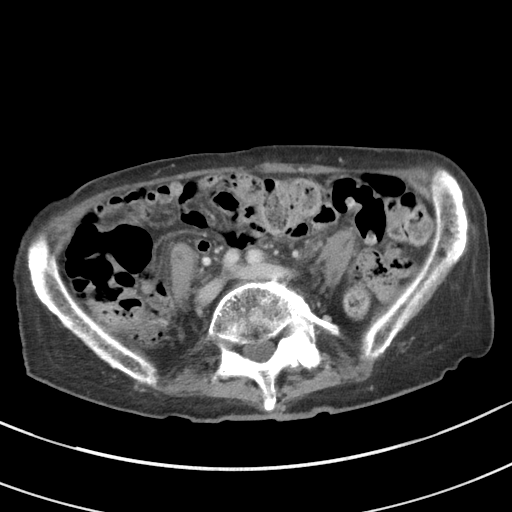
[im 43/74  soft-tissue]
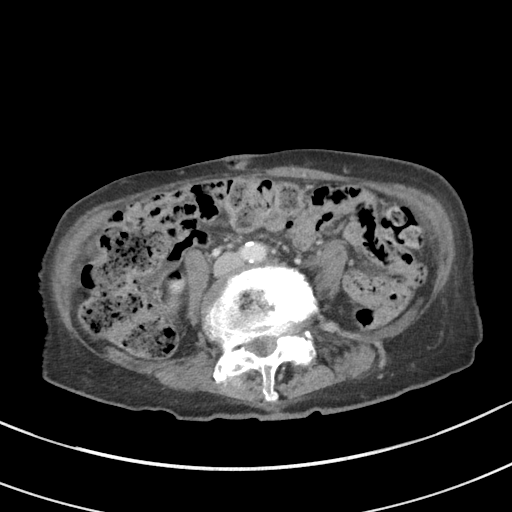
[im 47/74  soft-tissue]
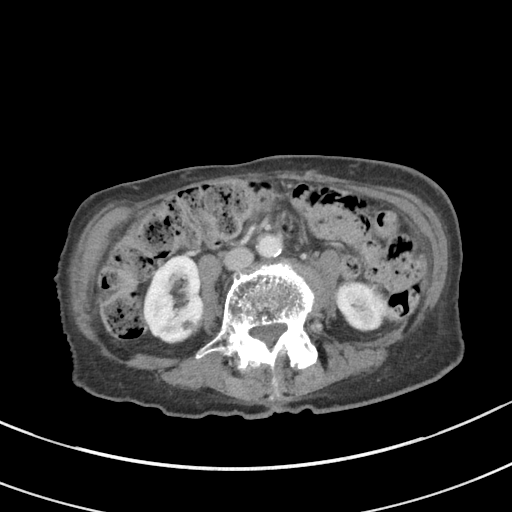
[im 47/74  bone]
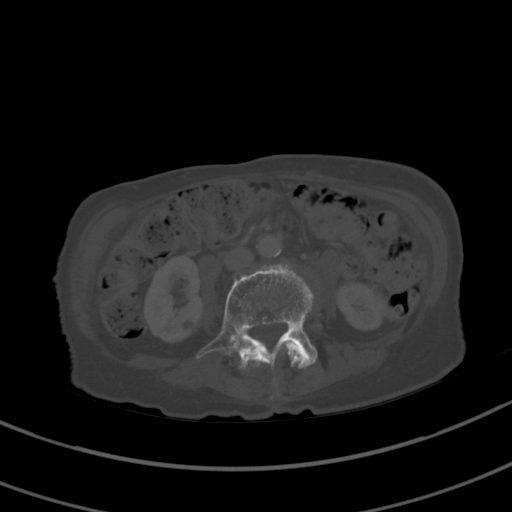
[im 54/74  soft-tissue]
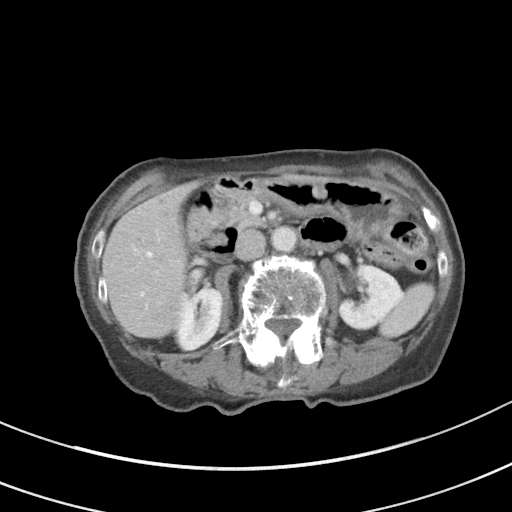
[im 58/74  soft-tissue]
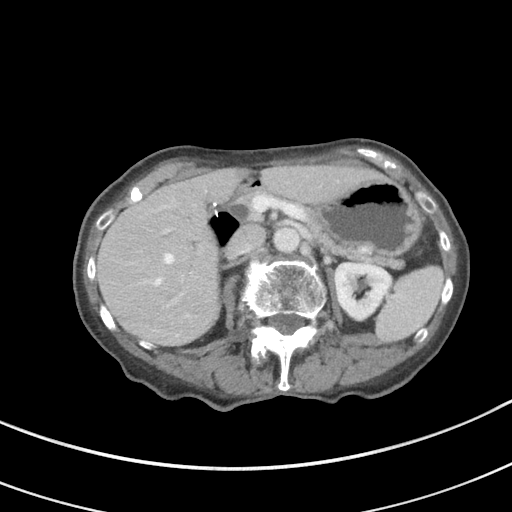
[im 62/74  soft-tissue]
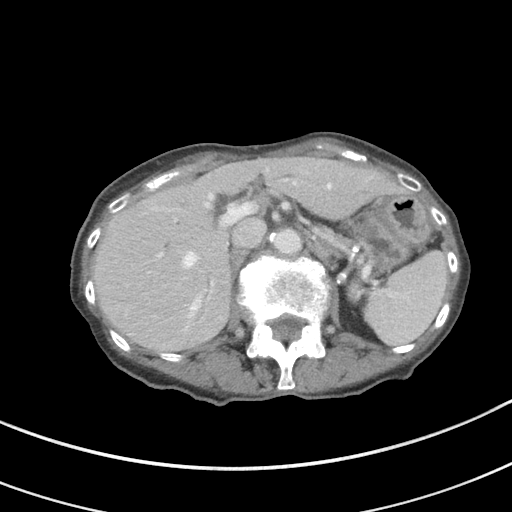
[im 70/74  soft-tissue]
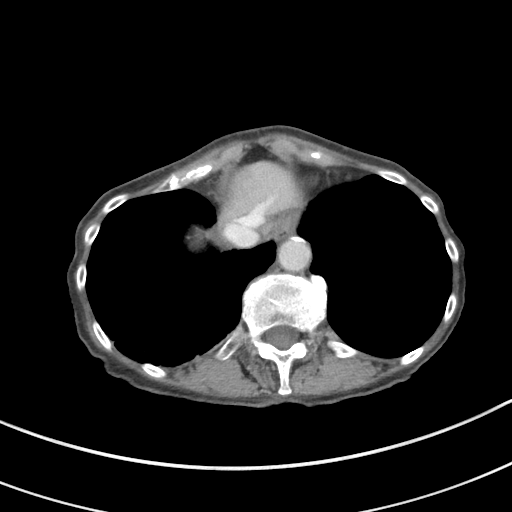

[Series 5: coronal st · coronal · 0.60mm/px · 3 of 67 slices shown]
[im 23/67  soft-tissue]
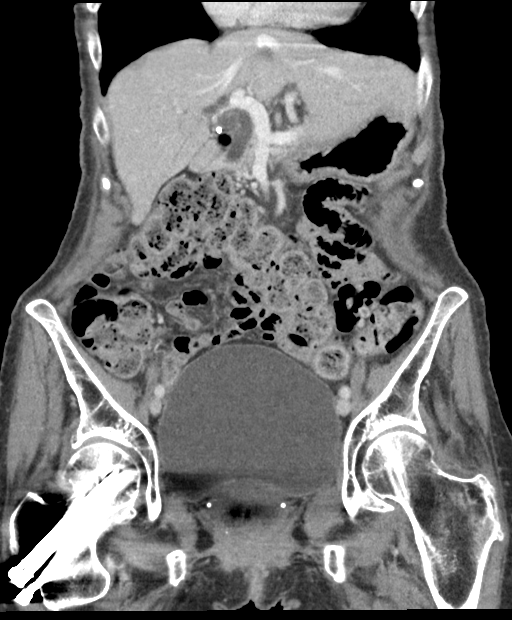
[im 30/67  soft-tissue]
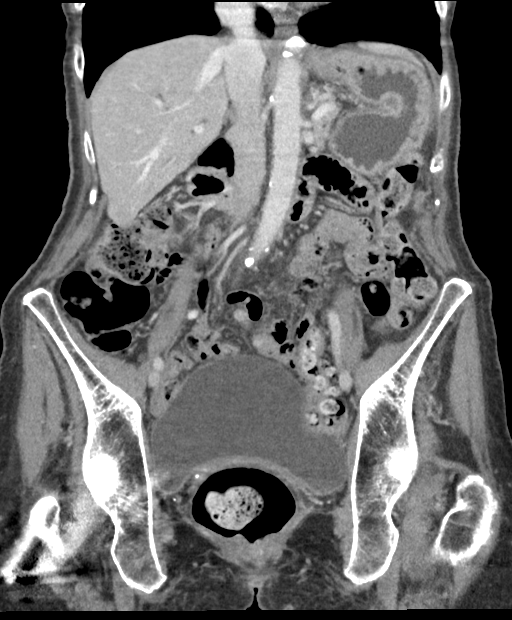
[im 37/67  soft-tissue]
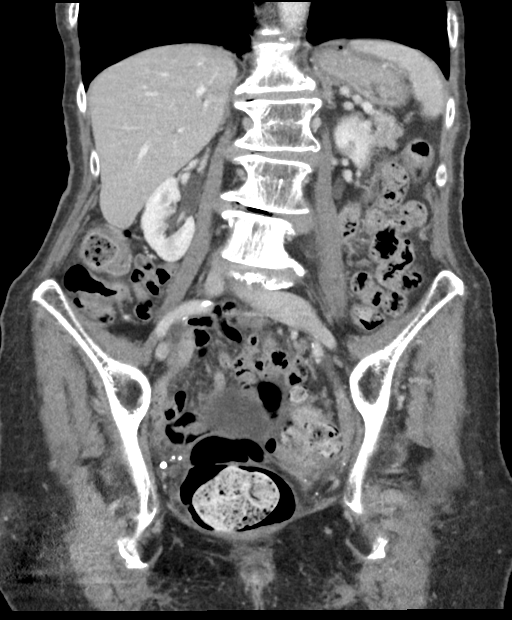

[16 of 46 positions shown; findings below may reference images not displayed]

FINDINGS: Lower chest: The lung bases are clear of acute process. No pleural
effusion or pulmonary lesions. The heart is normal in size. No
pericardial effusion. The distal esophagus and aorta are
unremarkable.

Hepatobiliary: No worrisome hepatic lesions. Stable mild common bile
duct dilatation due to a gin prior cholecystectomy.

Pancreas: Moderate pancreatic atrophy. No mass or acute
inflammation.

Spleen: Normal size. No focal lesions.

Adrenals/Urinary Tract: The adrenal glands and kidneys are
unremarkable and stable. No worrisome renal lesions or
hydroureteronephrosis. The bladder is slightly distended. No bladder
mass or asymmetric bladder wall thickening. No bladder calculi.

Stomach/Bowel: Moderate stool throughout the colon may suggest
constipation. No findings for small bowel obstruction.

Vascular/Lymphatic: Stable atherosclerotic calcifications involving
the aorta and branch vessels but no aneurysm or dissection. The
major venous structures are patent. No mesenteric or retroperitoneal
mass or adenopathy.

Reproductive: Surgically absent.

Other: No pelvic mass or adenopathy. No free pelvic fluid
collections. No inguinal mass or adenopathy. No abdominal wall
hernia or subcutaneous lesions.

Musculoskeletal: Stable degenerative changes involving the lumbar
spine and stable osteoporosis. No acute bony findings or worrisome
bone lesions.
IMPRESSION: 1. No acute abdominal/pelvic findings, mass lesions or adenopathy.
2. Moderate stool throughout the colon may suggest constipation.
3. Stable mild common bile duct dilatation due to a prior
cholecystectomy.
4. Aortic atherosclerosis.

Aortic Atherosclerosis ([id number]).

## 2020-11-02 ENCOUNTER — Other Ambulatory Visit: Payer: Self-pay | Admitting: Family Medicine

## 2020-11-05 ENCOUNTER — Other Ambulatory Visit: Payer: Self-pay | Admitting: Family Medicine

## 2020-11-05 NOTE — Telephone Encounter (Signed)
Refill request Zofran Last office visit 10/29/20 Last refill 09/13/20 #20/1

## 2020-11-05 NOTE — Telephone Encounter (Signed)
Pt called triage line asking for another medication. I called her back to find out what she was needing. She was asking about hyoscyamine. She wanted to make sure she can take it when she takes ondansteron. Please advise.

## 2020-11-07 NOTE — Telephone Encounter (Signed)
Okay to take together if needed.  rx sent.  Thanks.

## 2020-11-11 ENCOUNTER — Telehealth: Payer: Self-pay | Admitting: Family Medicine

## 2020-11-11 NOTE — Telephone Encounter (Signed)
Patient came into office and stated that she is needing refill of HYDROcodone-acetaminophen (NORCO/VICODIN) 5-325 MG per tablet 1-2 tablet  [[id number]].  Pt wanted to have it sent to [facility] on [address].. Please call when done at [phone]

## 2020-11-12 ENCOUNTER — Telehealth: Payer: Self-pay

## 2020-11-12 MED ORDER — HYDROCODONE-ACETAMINOPHEN 5-325 MG PO TABS: 1.0000 | ORAL_TABLET | Freq: Two times a day (BID) | ORAL | 0 refills | Status: DC

## 2020-11-12 NOTE — Telephone Encounter (Signed)
-----   Message from Loralie Champagne, PA-C sent at 11/02/2020  3:56 PM EST ----- Can you please look into this?  Apparently she has still not received any Creon.  Thank you,  Jess ----- Message ----- From: [name], MD Sent: 10/31/2020  10:58 PM EST To: Loralie Champagne, PA-C

## 2020-11-12 NOTE — Telephone Encounter (Signed)
Please Advise

## 2020-11-12 NOTE — Telephone Encounter (Signed)
I spoke with [facility] and they said they spoke to the patient who said she wanted to talk to the doctor before she started the Creon and to hold off on filling it.

## 2020-11-12 NOTE — Telephone Encounter (Signed)
Sent. Thanks.   

## 2020-11-12 NOTE — Telephone Encounter (Signed)
See note from my staff.  Apparently [name] declined to fill it for now.

## 2020-11-14 NOTE — Telephone Encounter (Signed)
Noted. Thanks.

## 2020-11-17 ENCOUNTER — Telehealth: Payer: Self-pay | Admitting: Family Medicine

## 2020-11-17 NOTE — Telephone Encounter (Signed)
Please call patient and give her the information for Clarita Crane at [phone] at [facility].  Extension [phone].    I have been trying to get in touch with [name] about this patient.  We keep playing phone tag back-and-forth and I have not been able to get in touch with her directly in the meantime.  The patient was asking about getting help at home and Ms. Marvel Burke is a kind and knowledgeable person who may be able to help.  I thank all involved.

## 2020-11-18 NOTE — Telephone Encounter (Signed)
Called and spoke with patient and gave her the information listed below. Patient was grateful for call.

## 2020-11-18 NOTE — Telephone Encounter (Signed)
Called and left voicemail for patient to return call to office.  °

## 2020-12-15 ENCOUNTER — Other Ambulatory Visit: Payer: Self-pay | Admitting: Family Medicine

## 2020-12-15 ENCOUNTER — Telehealth: Payer: Self-pay | Admitting: Family Medicine

## 2020-12-15 MED ORDER — MIRTAZAPINE 15 MG PO TABS: ORAL_TABLET | ORAL | 3 refills | Status: DC

## 2020-12-15 MED ORDER — ONDANSETRON 4 MG PO TBDP: ORAL_TABLET | ORAL | 2 refills | Status: DC

## 2020-12-15 MED ORDER — LORAZEPAM 0.5 MG PO TABS
0.2500 mg | ORAL_TABLET | Freq: Two times a day (BID) | ORAL | 1 refills | Status: DC | PRN
Start: 2020-12-15 — End: 2021-02-22

## 2020-12-15 NOTE — Telephone Encounter (Signed)
Refill already sent. Thanks

## 2020-12-15 NOTE — Telephone Encounter (Signed)
Sent. Thanks.   

## 2020-12-15 NOTE — Telephone Encounter (Signed)
Pharmacy requests refill on: Lisinopril 10 mg   LAST REFILL: 03/25/2020 (Q-90, R-2) LAST OV: 10/29/2020 NEXT OV: Not Scheduled  PHARMACY: [facility] requests refill on: Lorazepam 0.5 mg   LAST REFILL: 10/03/2020 (Q-40, R-1) LAST OV: 10/29/2020 NEXT OV: Not Scheduled  PHARMACY: [facility]

## 2020-12-15 NOTE — Telephone Encounter (Signed)
Pt called in needs to get the zofran, lisinopril, lorazepam, mirtazapine refilled.   [place]

## 2020-12-15 NOTE — Telephone Encounter (Signed)
Lisinopril had already been sent in today for patient. Patient still needs refills on zofran, lorazepam 0.5 mg tabs and mirtazapine 15 mg tabs.   LOV- 10/29/20 Next OV -  not scheduled Last refill on Lorazepam was 10/03/20 #40/1, zofran 11/07/20, and Mirtazapine 01/02/20 #90/3

## 2021-01-06 ENCOUNTER — Ambulatory Visit (INDEPENDENT_AMBULATORY_CARE_PROVIDER_SITE_OTHER): Payer: Medicare Other | Admitting: Internal Medicine

## 2021-01-06 ENCOUNTER — Other Ambulatory Visit: Payer: Self-pay

## 2021-01-06 ENCOUNTER — Telehealth: Payer: Self-pay

## 2021-01-06 ENCOUNTER — Encounter: Payer: Self-pay | Admitting: Internal Medicine

## 2021-01-06 VITALS — BP 144/96 | HR 98 | Temp 96.7°F | Wt 85.0 lb

## 2021-01-06 DIAGNOSIS — R300 Dysuria: Secondary | ICD-10-CM

## 2021-01-06 DIAGNOSIS — R3915 Urgency of urination: Secondary | ICD-10-CM | POA: Diagnosis not present

## 2021-01-06 LAB — POC URINALSYSI DIPSTICK (AUTOMATED)
Bilirubin, UA: NEGATIVE
Blood, UA: NEGATIVE
Glucose, UA: NEGATIVE
Ketones, UA: NEGATIVE
Nitrite, UA: NEGATIVE
Protein, UA: NEGATIVE
Spec Grav, UA: <=1.005 — AB (ref 1.010–1.025)
Urobilinogen, UA: 0.2 E.U./dL
pH, UA: 6 (ref 5.0–8.0)

## 2021-01-06 MED ORDER — NITROFURANTOIN MONOHYD MACRO 100 MG PO CAPS: 100.0000 mg | ORAL_CAPSULE | Freq: Two times a day (BID) | ORAL | 0 refills | Status: DC

## 2021-01-06 NOTE — Progress Notes (Signed)
HPI  Pt presents to the clinic today with c/o urinary urgency and intermittent dysuria. She reports this started 3-4 days ago. She denies frequency, blood in her urine or incontinence. She denies fever, chills, nausea or worsening low back pain. She has not tried anything OTC for this.   Review of Systems  Past Medical History:  Diagnosis Date  . Anxiety   . Barrett esophagus   . Basal cell carcinoma   . Diplopia    history of,hospital 2/26-2/27/10  . Diverticulitis    colonoscopy 1995.  CT 01/31/09, 02/02/09  . Hard of hearing   . Hiatal hernia   . Hiatal hernia    EGD 1998, 2007 ( Dr. Sharlett Iles)  . Hyperlipemia   . Hypertension   . Hypothyroidism   . Osteoporosis DXA 03-11-14  . Personal history of colonic polyps    colonoscpy, removed 1995  . Thyroid carcinoma ([place]) 03/11/1994  . UTI (urinary tract infection)    Chronic    Family History  Problem Relation Age of Onset  . Cancer Brother        Pharyngeal   . Heart disease Brother        MI  . Diabetes Brother   . Diabetes Sister        amputation  . Hypertension Mother   . Stroke Father   . Kidney cancer Neg Hx   . Bladder Cancer Neg Hx     Social History   Socioeconomic History  . Marital status: Widowed    Spouse name: Not on file  . Number of children: 0  . Years of education: Not on file  . Highest education level: Not on file  Occupational History  . Occupation: retired    Comment: Publishing copy closed 1995  Tobacco Use  . Smoking status: Never Smoker  . Smokeless tobacco: Never Used  Substance and Sexual Activity  . Alcohol use: No    Alcohol/week: 0.0 standard drinks  . Drug use: No  . Sexual activity: Not on file  Other Topics Concern  . Not on file  Social History Narrative   Widowed.  Final sib died in 11-Mar-2013   1 step son out of state, in [place] alone, drives as of [id number]   Social Determinants of Health   Financial Resource Strain: Not on file  Food Insecurity: Not on file   Transportation Needs: Not on file  Physical Activity: Not on file  Stress: Not on file  Social Connections: Not on file  Intimate Partner Violence: Not on file    Allergies  Allergen Reactions  . Bentyl [Dicyclomine Hcl] Other (See Comments)    abd pain  . Ciprofloxacin     Can cause GI upset, esp if taken with metamucil, o/w it is tolerated  . Cortisone Acetate     REACTION: nausea  . Doxycycline Other (See Comments)    GI upset  . Gabapentin     REACTION: severe dizziness  . Metoclopramide Hcl   . Miralax [Polyethylene Glycol]     abd discomfort  . Penicillins     Has patient had a PCN reaction causing immediate rash, facial/tongue/throat swelling, SOB or lightheadedness with hypotension: yes Has patient had a PCN reaction causing severe rash involving mucus membranes or skin necrosis: yes Has patient had a PCN reaction that required hospitalization no Has patient had a PCN reaction occurring within the last 10 years: no If all of the above answers are "NO", then may  proceed with Cephalosporin use.   . Promethazine Hcl     REACTION: unspecified  . Remeron [Mirtazapine] Other (See Comments)    Tolerates 15 mg/day but does not tolerate 30 mg.  Felt jittery with 30 mg dose.  Sarina Ill [Sulfamethoxazole-Trimethoprim] Other (See Comments)    GI upset but not allergy     Constitutional: Denies fever, malaise, fatigue, headache or abrupt weight changes.   GU: Pt reports urgency and pain with urination. Denies frequency, burning sensation, blood in urine, odor or discharge. Skin: Denies redness, rashes, lesions or ulcercations.   No other specific complaints in a complete review of systems (except as listed in HPI above).    Objective:   Physical Exam  BP (!) 144/96   Pulse 98   Temp (!) 96.7 F (35.9 C) (Temporal)   Wt 85 lb (38.6 kg)   LMP 03/28/1989   SpO2 96%   BMI 15.55 kg/m   Wt Readings from Last 3 Encounters:  10/29/20 86 lb (39 kg)  09/24/20 88 lb 1 oz  (39.9 kg)  09/17/20 86 lb 6.7 oz (39.2 kg)    General: Appears her stated age, chronically ill appearing, in NAD. Cardiovascular: Normal rate.   Pulmonary/Chest: Normal effort. Abdomen: Soft. Normal bowel sounds. No distention or masses noted.  Tender to palpation over the bladder area. No CVA tenderness.        Assessment & Plan:   Urgency, Dysuria:  Urinalysis: 3+ blood Will send urine culture eRx sent if for Macrobid 100 mg BID x 5 days OK to take AZO OTC Drink plenty of fluids  RTC as needed or if symptoms persist. Webb Silversmith, NP This visit occurred during the SARS-CoV-2 public health emergency.  Safety protocols were in place, including screening questions prior to the visit, additional usage of staff PPE, and extensive cleaning of exam room while observing appropriate contact time as indicated for disinfecting solutions.

## 2021-01-06 NOTE — Telephone Encounter (Signed)
Noted. Thanks.

## 2021-01-06 NOTE — Patient Instructions (Signed)
Urinary Tract Infection, Adult A urinary tract infection (UTI) is an infection of any part of the urinary tract. The urinary tract includes:  The kidneys.  The ureters.  The bladder.  The urethra. These organs make, store, and get rid of pee (urine) in the body. What are the causes? This infection is caused by germs (bacteria) in your genital area. These germs grow and cause swelling (inflammation) of your urinary tract. What increases the risk? The following factors may make you more likely to develop this condition:  Using a small, thin tube (catheter) to drain pee.  Not being able to control when you pee or poop (incontinence).  Being female. If you are female, these things can increase the risk: ? Using these methods to prevent pregnancy:  A medicine that kills sperm (spermicide).  A device that blocks sperm (diaphragm). ? Having low levels of a female hormone (estrogen). ? Being pregnant. You are more likely to develop this condition if:  You have genes that add to your risk.  You are sexually active.  You take antibiotic medicines.  You have trouble peeing because of: ? A prostate that is bigger than normal, if you are female. ? A blockage in the part of your body that drains pee from the bladder. ? A kidney stone. ? A nerve condition that affects your bladder. ? Not getting enough to drink. ? Not peeing often enough.  You have other conditions, such as: ? Diabetes. ? A weak disease-fighting system (immune system). ? Sickle cell disease. ? Gout. ? Injury of the spine. What are the signs or symptoms? Symptoms of this condition include:  Needing to pee right away.  Peeing small amounts often.  Pain or burning when peeing.  Blood in the pee.  Pee that smells bad or not like normal.  Trouble peeing.  Pee that is cloudy.  Fluid coming from the vagina, if you are female.  Pain in the belly or lower back. Other symptoms include:  Vomiting.  Not  feeling hungry.  Feeling mixed up (confused). This may be the first symptom in older adults.  Being tired and grouchy (irritable).  A fever.  Watery poop (diarrhea). How is this treated?  Taking antibiotic medicine.  Taking other medicines.  Drinking enough water. In some cases, you may need to see a specialist. Follow these instructions at home: Medicines  Take over-the-counter and prescription medicines only as told by your doctor.  If you were prescribed an antibiotic medicine, take it as told by your doctor. Do not stop taking it even if you start to feel better. General instructions  Make sure you: ? Pee until your bladder is empty. ? Do not hold pee for a long time. ? Empty your bladder after sex. ? Wipe from front to back after peeing or pooping if you are a female. Use each tissue one time when you wipe.  Drink enough fluid to keep your pee pale yellow.  Keep all follow-up visits.   Contact a doctor if:  You do not get better after 1-2 days.  Your symptoms go away and then come back. Get help right away if:  You have very bad back pain.  You have very bad pain in your lower belly.  You have a fever.  You have chills.  You feeling like you will vomit or you vomit. Summary  A urinary tract infection (UTI) is an infection of any part of the urinary tract.  This condition is caused by   germs in your genital area.  There are many risk factors for a UTI.  Treatment includes antibiotic medicines.  Drink enough fluid to keep your pee pale yellow. This information is not intended to replace advice given to you by your health care provider. Make sure you discuss any questions you have with your health care provider. Document Revised: 06/25/2020 Document Reviewed: 06/25/2020 Elsevier Patient Education  2021 Elsevier Inc.  

## 2021-01-06 NOTE — Telephone Encounter (Signed)
[facility] - Client TELEPHONE ADVICE RECORD [facility] Patient Name: [name] Gender: Female DOB: 04-Jun-1930 Age: 87 Y 43 M 3 D Return Phone Number: [phone] (Primary) [place] [address] Client [facility] - Client Client Site Advance - Day Physician Renford Dills - MD Contact Type Call Who Is Calling Patient / Member / Family / Caregiver Call Type Triage / Clinical Relationship To Patient Self Return Phone Number [phone] (Primary) Chief Complaint Urination Pain Reason for Call Symptomatic / Request for Health Information Initial Comment Has painful urination, little output and uncomfertable. Translation No Nurse Assessment Nurse: Lucky Cowboy, RN, Levada Dy Date/Time (Eastern Time): 01/06/2021 9:56:57 AM Confirm and document reason for call. If symptomatic, describe symptoms. ---Caller stated that she thinks she has UTI. She has burning, painful urination for a few days. No back pain, blood in urine, or fever. Does the patient have any new or worsening symptoms? ---Yes Will a triage be completed? ---Yes Related visit to physician within the last 2 weeks? ---No Does the PT have any chronic conditions? (i.e. diabetes, asthma, this includes High risk factors for pregnancy, etc.) ---Yes List chronic conditions. ---HTN, anxiety, throat cancer, thyroid, stomach issues Is this a behavioral health or substance abuse call? ---No Guidelines Guideline Title Affirmed Question Affirmed Notes Nurse Date/Time Eilene Ghazi Time) Urination Pain - Female Artificial heart valve or artificial joint Dew, RN, Levada Dy 01/06/2021 10:00:07 AM Disp. Time Eilene Ghazi Time) Disposition Final User 01/06/2021 10:02:21 AM See HCP within 4 Hours (or PCP triage) Yes Dew, RN, Marin Shutter Disagree/Comply Comply Caller Understands Yes PLEASE NOTE: All timestamps contained within this report are  represented as [place] Standard Time. CONFIDENTIALTY NOTICE: This fax transmission is intended only for the addressee. It contains information that is legally privileged, confidential or otherwise protected from use or disclosure. If you are not the intended recipient, you are strictly prohibited from reviewing, disclosing, copying using or disseminating any of this information or taking any action in reliance on or regarding this information. If you have received this fax in error, please notify us immediately by telephone so that we can arrange for its return to [name]. Phone: [phone], Toll-Free: [phone], Fax: [phone] Page: 2 of 2 Call Id: [id number] PreDisposition Call Doctor Care Advice Given Per Guideline SEE HCP (OR PCP TRIAGE) WITHIN 4 HOURS: * IF OFFICE WILL BE OPEN: You need to be seen within the next 3 or 4 hours. Call your doctor (or NP/PA) now or as soon as the office opens. CALL BACK IF: * You become worse CARE ADVICE given per Urination Pain - Female (Adult) guideline. Comments User: Raford Pitcher, RN Date/Time Eilene Ghazi Time): 01/06/2021 10:06:43 AM Warm transferred caller to the office so she could speak with Raquel Sarna about getting an app't. Referrals REFERRED TO PCP OFFICE Warm transfer to backline

## 2021-01-06 NOTE — Telephone Encounter (Signed)
Patient has an appointment today at 3:20 with Garnette Gunner, NP

## 2021-01-09 LAB — URINE CULTURE
MICRO NUMBER:: 11519575
SPECIMEN QUALITY:: ADEQUATE

## 2021-01-11 ENCOUNTER — Ambulatory Visit: Payer: Medicare Other | Admitting: Family Medicine

## 2021-01-12 ENCOUNTER — Telehealth: Payer: Self-pay

## 2021-01-12 NOTE — Telephone Encounter (Signed)
Patient called in regarding her lab results from her urine culture. Per Webb Silversmith, NP (see result note), there was no UTI. Patient stated that she is feeling a little better. Instructed patient to call back if her symptoms do not improve. Patient verbalized understanding.

## 2021-01-18 ENCOUNTER — Telehealth: Payer: Self-pay

## 2021-01-18 NOTE — Telephone Encounter (Signed)
Pt saw R Baity NP on 01/06/21 for UTI. Pt said she finished the Macrobid 100 mg bid for 5 days after being seen on 01/06/21; pt said after finishing abx she felt like she was getting better slowly. Pt got a call (see phone note on 01/12/21) that the urine culture results showed no UTI. Pt was advised to cb if symptoms do not improve.  Pt said she is getting better slowly but wanted DR  Carole Civil opinion if pt should wait awhile longer or schedule appt to see Dr [name]. Pt said the urinary frequency is still there but better than when seen; pt has nausea on and off but zofran helps that; no burning or pain when urinating and no abd pain or back pain that pt does not normally have. No fever or other covid symptoms except runny nose ans sneezing on and off for 1 month., pt said she is not in any distress and only wants to see Dr [name] if Dr [name] thinks that pt should have FU appt. [place]. Pt request cb after Dr [name] reviews this note. Sending to Dr [name] who is out of office and DR G who is in office. [place].

## 2021-01-18 NOTE — Telephone Encounter (Signed)
May await Dr Josefine Class return.

## 2021-01-19 NOTE — Telephone Encounter (Signed)
I think that if the patient has any residual symptoms or if she has any concerns that she wants to address, then I think it makes sense to schedule a follow-up.  If she is completely feeling better, and if she has no concerns, then I do not think she necessarily needs a follow-up right now.  Given the residual urinary frequency, I think it makes sense to schedule the appointment but she could always cancel if the symptoms totally resolved.  Thanks.

## 2021-01-20 NOTE — Telephone Encounter (Signed)
Spoke with patient who stated that she is feeling somewhat better, but she is still experiencing urinary frequency. Scheduled patient for 01/27/2021 at 10:30. Informed patient to call back if she experienced any new or worsening symptoms. Patient verbalized understanding.

## 2021-01-24 ENCOUNTER — Other Ambulatory Visit: Payer: Self-pay | Admitting: Family Medicine

## 2021-01-24 NOTE — Telephone Encounter (Signed)
Name of Medication: Hydrocodone Name of Pharmacy: [facility] or Written Date and Quantity: 11/12/20 #180 Last Office Visit and Type: 01/06/21 acute Next Office Visit and Type: 01/27/21 Last Controlled Substance Agreement Date: 08/07/14 Last UDS:05/04/20

## 2021-01-25 NOTE — Telephone Encounter (Signed)
Sent. Thanks.   

## 2021-01-27 ENCOUNTER — Ambulatory Visit (INDEPENDENT_AMBULATORY_CARE_PROVIDER_SITE_OTHER): Payer: Medicare Other | Admitting: Family Medicine

## 2021-01-27 ENCOUNTER — Encounter: Payer: Self-pay | Admitting: Family Medicine

## 2021-01-27 ENCOUNTER — Other Ambulatory Visit: Payer: Self-pay

## 2021-01-27 VITALS — BP 128/84 | HR 100 | Temp 97.9°F | Ht 62.0 in | Wt 87.0 lb

## 2021-01-27 DIAGNOSIS — R300 Dysuria: Secondary | ICD-10-CM | POA: Diagnosis not present

## 2021-01-27 DIAGNOSIS — R634 Abnormal weight loss: Secondary | ICD-10-CM

## 2021-01-27 DIAGNOSIS — Z659 Problem related to unspecified psychosocial circumstances: Secondary | ICD-10-CM

## 2021-01-27 LAB — POC URINALSYSI DIPSTICK (AUTOMATED)
Bilirubin, UA: NEGATIVE
Blood, UA: NEGATIVE
Glucose, UA: NEGATIVE
Ketones, UA: NEGATIVE
Leukocytes, UA: NEGATIVE
Nitrite, UA: NEGATIVE
Protein, UA: NEGATIVE
Spec Grav, UA: 1.015 (ref 1.010–1.025)
Urobilinogen, UA: 0.2 E.U./dL
pH, UA: 6 (ref 5.0–8.0)

## 2021-01-27 MED ORDER — ONDANSETRON 4 MG PO TBDP: ORAL_TABLET | ORAL | 2 refills | Status: DC

## 2021-01-27 MED ORDER — PANCRELIPASE (LIP-PROT-AMYL) 36000-114000 UNITS PO CPEP
ORAL_CAPSULE | ORAL | 3 refills | Status: DC
Start: 2021-01-27 — End: 2021-04-14

## 2021-01-27 NOTE — Patient Instructions (Signed)
I sent your pain meds to [facility] a few days ago.   I sent another rx for creon to help you absorb food better.  See if they can fill that.  If not, then let me know.  Take care.  Glad to see you. We'll update your about your urine culture.

## 2021-01-27 NOTE — Progress Notes (Signed)
This visit occurred during the SARS-CoV-2 public health emergency.  Safety protocols were in place, including screening questions prior to the visit, additional usage of staff PPE, and extensive cleaning of exam room while observing appropriate contact time as indicated for disinfecting solutions.  Urinary sx.  Repeat ucx pending.  Prev ucx positive, prev pain improved but still with urinary frequency.  Not much burning with urination but frequency is still the issue, though frequency seems to be getting better slowly.    She was concerned about people near her property.  She had prev called police but the person still wasn't on the property when police arrived.  She had talked with [facility] [employer] in the meantime.  She thought she was still safe at home.  Discussed.  She should be able to pick up her hydrocodone at [facility].  The prescription was sent a few days ago.  She will update me if she has trouble getting that filled.  No adverse effect on medication.  She has not started creon yet.  rx sent to pharmacy.  Discussed medication use.  She will update me if she is not able to get the Creon prescription filled.  Meds, vitals, and allergies reviewed.   ROS: Per HPI unless specifically indicated in ROS section   GEN: nad, alert and oriented HEENT: ncat NECK: supple w/o LA CV: rrr PULM: ctab, no inc wob ABD: soft, +bs, not tender to palpation. EXT: no edema SKIN: no acute rash

## 2021-01-28 LAB — URINE CULTURE
MICRO NUMBER:: 11603597
Result:: NO GROWTH
SPECIMEN QUALITY:: ADEQUATE

## 2021-01-29 NOTE — Assessment & Plan Note (Signed)
See notes on labs.  Still okay for outpatient follow-up.

## 2021-01-29 NOTE — Assessment & Plan Note (Signed)
She has not started creon yet.  rx sent to pharmacy.  Discussed medication use.  She will update me if she is not able to get the Creon prescription filled.

## 2021-01-29 NOTE — Assessment & Plan Note (Signed)
See above.  No active threat known.  She thought she was still safe at home and she has checked with the police and [place].  She will update me as needed.

## 2021-02-14 ENCOUNTER — Telehealth: Payer: Self-pay | Admitting: *Deleted

## 2021-02-14 NOTE — Telephone Encounter (Signed)
Would still take creon.  Would take 1 capsule (36,000 Units total) by mouth 3 (three) times daily with meals. May also take 1 capsule (36,000 Units total) as needed (with snacks). Thanks.

## 2021-02-14 NOTE — Telephone Encounter (Signed)
Called patient reviewed all information and repeated back to me. Will call if any questions.  She will start on medications and call if any issues.

## 2021-02-14 NOTE — Telephone Encounter (Signed)
Patient called stating that she just picked up the Creon prescription Friday and there was a lot of confusion with the prescription. Patient stated that she could not get the tops off and had to take them to [facility] to get [facility] to help her. Patient stated that they put the pills in two separate bottles which confused her. . Patient stated that she has concerns about starting any new medications. Patient stated that she wants to make sure that Dr. [name] feels that it is safe for her to take Creon with all of the other medications that she is taking. Patient also wanted to confirm the directions.

## 2021-02-21 ENCOUNTER — Other Ambulatory Visit: Payer: Self-pay | Admitting: Family Medicine

## 2021-02-21 NOTE — Telephone Encounter (Signed)
Refill request Lorazepam Last refill 12/15/20 #40/1 Last office visit 01/27/21

## 2021-02-22 NOTE — Telephone Encounter (Signed)
Sent. Thanks.   

## 2021-03-03 ENCOUNTER — Telehealth: Payer: Self-pay

## 2021-03-03 NOTE — Telephone Encounter (Signed)
Pt left v/m that the med hurts her stomach an she can't take it. I spoke with pt and name of med is creon and the capsules are huge and could not swallow and on paperwork with med and said if you cannot take capsule open capsule and sprinkle on food. Pt sprinkled on food and had mid stomach pain that shot down to lower stomach. Pt said she could not take the Creon and pt wants to know what to do or change meds. Pt took creon for 3 days and then pt stopped the Creon. Pt has taken zofran if is nauseated. Pt has been having a lot of gas and pt is able to pass the gas. Pt request review by Dr [name] and then cb to pt. [place].Uc & ED precautions given and pt voiced understanding.

## 2021-03-03 NOTE — Telephone Encounter (Signed)
I need help from GI and I routed this for advice.  If she can't tolerate creon I need GI recommendations.  Thanks.

## 2021-03-04 ENCOUNTER — Encounter: Payer: Self-pay | Admitting: Gastroenterology

## 2021-03-04 NOTE — Telephone Encounter (Signed)
Advised patient that she will need to see the GI clinic. Advised she should be getting a call from them to schedule appt.

## 2021-03-04 NOTE — Telephone Encounter (Signed)
She probably needs a visit in our office to see what else we could offer.  I will have my staff reach out to her.

## 2021-03-04 NOTE — Telephone Encounter (Signed)
See below, please notify Pt. I'll defer to GI.  I thank all involved.

## 2021-04-14 ENCOUNTER — Telehealth: Payer: Self-pay | Admitting: Family Medicine

## 2021-04-14 ENCOUNTER — Telehealth: Payer: Self-pay

## 2021-04-14 ENCOUNTER — Ambulatory Visit (INDEPENDENT_AMBULATORY_CARE_PROVIDER_SITE_OTHER): Payer: Medicare Other | Admitting: Gastroenterology

## 2021-04-14 ENCOUNTER — Encounter: Payer: Self-pay | Admitting: Gastroenterology

## 2021-04-14 ENCOUNTER — Other Ambulatory Visit (INDEPENDENT_AMBULATORY_CARE_PROVIDER_SITE_OTHER): Payer: Medicare Other

## 2021-04-14 VITALS — BP 186/84 | HR 80 | Ht 62.0 in | Wt 85.2 lb

## 2021-04-14 DIAGNOSIS — R110 Nausea: Secondary | ICD-10-CM | POA: Diagnosis not present

## 2021-04-14 DIAGNOSIS — F1190 Opioid use, unspecified, uncomplicated: Secondary | ICD-10-CM

## 2021-04-14 DIAGNOSIS — R636 Underweight: Secondary | ICD-10-CM

## 2021-04-14 DIAGNOSIS — R1013 Epigastric pain: Secondary | ICD-10-CM

## 2021-04-14 DIAGNOSIS — Z659 Problem related to unspecified psychosocial circumstances: Secondary | ICD-10-CM

## 2021-04-14 LAB — COMPREHENSIVE METABOLIC PANEL
ALT: 11 U/L (ref 0–35)
AST: 20 U/L (ref 0–37)
Albumin: 4.4 g/dL (ref 3.5–5.2)
Alkaline Phosphatase: 70 U/L (ref 39–117)
BUN: 14 mg/dL (ref 6–23)
CO2: 35 mEq/L — ABNORMAL HIGH (ref 19–32)
Calcium: 9.4 mg/dL (ref 8.4–10.5)
Chloride: 99 mEq/L (ref 96–112)
Creatinine, Ser: 0.79 mg/dL (ref 0.40–1.20)
GFR: 66.66 mL/min (ref >60.00)
Glucose, Bld: 97 mg/dL (ref 70–99)
Potassium: 4.4 mEq/L (ref 3.5–5.1)
Sodium: 139 mEq/L (ref 135–145)
Total Bilirubin: 0.8 mg/dL (ref 0.2–1.2)
Total Protein: 7.7 g/dL (ref 6.0–8.3)

## 2021-04-14 LAB — CBC WITH DIFFERENTIAL/PLATELET
Basophils Absolute: 0 10*3/uL (ref 0.0–0.1)
Basophils Relative: 0.7 % (ref 0.0–3.0)
Eosinophils Absolute: 0.1 10*3/uL (ref 0.0–0.7)
Eosinophils Relative: 3 % (ref 0.0–5.0)
HCT: 36.8 % (ref 36.0–46.0)
Hemoglobin: 12.5 g/dL (ref 12.0–15.0)
Lymphocytes Relative: 25.7 % (ref 12.0–46.0)
Lymphs Abs: 1.2 10*3/uL (ref 0.7–4.0)
MCHC: 33.9 g/dL (ref 30.0–36.0)
MCV: 93.2 fl (ref 78.0–100.0)
Monocytes Absolute: 0.4 10*3/uL (ref 0.1–1.0)
Monocytes Relative: 8.3 % (ref 3.0–12.0)
Neutro Abs: 2.9 10*3/uL (ref 1.4–7.7)
Neutrophils Relative %: 62.3 % (ref 43.0–77.0)
Platelets: 193 10*3/uL (ref 150.0–400.0)
RBC: 3.94 Mil/uL (ref 3.87–5.11)
RDW: 12.7 % (ref 11.5–15.5)
WBC: 4.7 10*3/uL (ref 4.0–10.5)

## 2021-04-14 LAB — LIPASE: Lipase: 16 U/L (ref 11.0–59.0)

## 2021-04-14 MED ORDER — FDGARD 25-20.75 MG PO CAPS: ORAL_CAPSULE | ORAL | 0 refills | Status: DC

## 2021-04-14 MED ORDER — ONDANSETRON 4 MG PO TBDP
4.0000 mg | ORAL_TABLET | Freq: Two times a day (BID) | ORAL | 5 refills | Status: DC
Start: 2021-04-14 — End: 2021-12-16

## 2021-04-14 MED ORDER — OMEPRAZOLE 20 MG PO CPDR: 20.0000 mg | DELAYED_RELEASE_CAPSULE | Freq: Every day | ORAL | 0 refills | Status: DC

## 2021-04-14 NOTE — Telephone Encounter (Signed)
PA submitted for ondansetron 4 mg BID.  Rec'd a call from [facility] asking some additional questions regarding diagnosis and contributing factors.  Representative indicates we should get an answer in the next day or so.

## 2021-04-14 NOTE — Telephone Encounter (Signed)
Please check with patient.  I put in social work referral to see about getting patient extra help/resources.  I got a note from the GI clinic.  Dr. Havery Moros was concerned about her being able to cooks meals at home, etc.  Thanks.

## 2021-04-14 NOTE — Patient Instructions (Addendum)
If you are age 85 or older, your body mass index should be between 23-30. Your Body mass index is 15.59 kg/m. If this is out of the aforementioned range listed, please consider follow up with your Primary Care Provider.  If you are age 38 or younger, your body mass index should be between 19-25. Your Body mass index is 15.59 kg/m. If this is out of the aformentioned range listed, please consider follow up with your Primary Care Provider.   Please go to the lab in the basement of our building to have lab work done as you leave today. Hit "B" for basement when you get on the elevator.  When the doors open the lab is on your left.  We will call you with the results. Thank you.  Due to recent changes in healthcare laws, you may see the results of your imaging and laboratory studies on MyChart before your provider has had a chance to review them.  We understand that in some cases there may be results that are confusing or concerning to you. Not all laboratory results come back in the same time frame and the provider may be waiting for multiple results in order to interpret others.  Please give [name] 48 hours in order for your provider to thoroughly review all the results before contacting the office for clarification of your results.    We have sent the following medications to your pharmacy for you to pick up at your convenience: Omeprazole 20 mg: Take once daily  Zofran: Take twice a day  We have given you samples of the following medication to take: FDGard: Take as directed  You should be eating three meals a day.  You have been scheduled for a follow up visit on Wednesday, 07-13-21 at 10:10am  Thank you for entrusting me with your care and for choosing [facility], Dr. Clyde Cellar

## 2021-04-14 NOTE — Progress Notes (Signed)
HPI :  87 y/o female here for a follow-up visit for chronically low/underweight, abdominal discomfort.  She has been seen in our office over the past few years for these issues.  Symptoms have been ongoing for years.  She has had 4 CT scans of the abdomen and pelvis since October 2018 as well as a CT scan of her chest, she has not had any evidence of malignancy.  She has also had an endoscopy in the past, done in 2018 for dysphagia, her stomach otherwise appeared okay.  She is accompanied by her hairdresser today, she has no family and apparently lives alone.  On her last CT scan in May 25, 2020 there was a question of some pancreatic atrophy.  We had a follow-up fecal pancreatic elastase level sent which was normal.  She was given an empiric trial of pancreatic enzymes for her symptoms which she states made things worse and she did not tolerate it.  She denies any steatorrhea, or diarrhea.  In fact she has constipation use MiraLAX to keep her stools regular.  Interestingly on a CT scan May 14, 2020 her pancreas was reported to be normal.  She is on Remeron chronically for appetite stimulation.  She does take Norco twice daily for chronic pain in her back and side she states.  She does have nausea intermittently, Zofran helps when she uses it but she states she does not take Zofran too frequently.  She is not having any vomiting.  Her pain appears to be mostly postprandial at times but also bothers her when she is not eating.  Certain foods she thinks such as beans can trigger it.  She states her weight has been stable for the past few months.  She denies any blood in her stools.  She is not using any NSAIDs.  She states she "eats well".  She however cannot clearly describe how many meal she eats a day.  It sounds like she often eats breakfast and then perhaps 1 other meal later in the day but she is not eating 3 meals a day.  She has Ensure as needed.  Her hairdresser who accompanies her today is  concerned that she is living alone and simply just not eating much.  She apparently has also had some paranoid delusions that people are trying to invade her home and kill her.  She apparently has no family in the local area to keep an eye out for her.   Prior work-up: Colonoscopy in 2012 with finding of severe left-sided diverticulosis, no polyps  EGD in 2012-Barrett's esophagus no dysplasia (irregular z-line)and chronic atrophic gastric mucosa as well as small hiatal hernia. biopsies showed chronic gastritis H. pylori negative peptic duodenitis . EGD 03/08/2017 - cricopharyngeal stenosis noted, dilated to 37mm, normal duodenal biopsies, no celiac, TTG negative  CT scan abdomen / pelvis / chest 08/2017 - no acute findings, diverticulosis sigmoid, otherwise normal - CBD 37mm post cholecystectomy   CT scan abdomen / pelvis 12/16/18:IMPRESSION: 1. No acute findings in the abdomen or pelvis. Specifically, no findings to explain the patient's history of abdominal pain. 2. Mild intra and extrahepatic biliary duct dilatation, stable since prior study. 3. Prominent stool volume throughout the colon. Imaging features would be compatible with clinical constipation. 4. Left colonic diverticulosis without diverticulitis. 5.  Aortic Atherosclerois (ICD10-170.0)   CT scan abdomen / pelvis 05/14/20 -  IMPRESSION: 1.  No acute findings in the abdomen/pelvis.  2. Subcentimeter hypodensity over the mid to lower pole  cortex of the right kidney too small to characterize but likely a cyst and unchanged.  3.  Aortic Atherosclerosis (ICD10-I70.0).  4. Stable bladder distension which may be due to a degree of bladder outlet obstruction. Recommend clinical correlation.    CT scan abdomen / pelvis 05/25/20: IMPRESSION: 1. No acute abdominal/pelvic findings, mass lesions or adenopathy. 2. Moderate stool throughout the colon may suggest constipation. 3. Stable mild common bile duct dilatation due to  a prior cholecystectomy. 4. Aortic atherosclerosis.  Aortic Atherosclerosis (ICD10-I70.0).  Pancreatic elastase normal > 500 on 05/05/20     Past Medical History:  Diagnosis Date  . Anxiety   . Barrett esophagus   . Basal cell carcinoma   . Diplopia    history of,hospital 2/26-2/27/10  . Diverticulitis    colonoscopy 1995.  CT 01/31/09, 02/02/09  . Hard of hearing   . Hiatal hernia   . Hiatal hernia    EGD 1998, 2007 ( Dr. Sharlett Iles)  . Hyperlipemia   . Hypertension   . Hypothyroidism   . Osteoporosis DXA 2015  . Personal history of colonic polyps    colonoscpy, removed 1995  . Thyroid carcinoma ([place]) 01/1994  . UTI (urinary tract infection)    Chronic     Past Surgical History:  Procedure Laterality Date  . ABDOMINAL HYSTERECTOMY    . CHOLECYSTECTOMY    . HIP SURGERY  08/1993   due to fracture of right hip  . SKIN CANCER EXCISION     basal cell left hand, forehead  . THYROIDECTOMY  04/1994   thyroid cancer   Family History  Problem Relation Age of Onset  . Cancer Brother        Pharyngeal   . Heart disease Brother        MI  . Diabetes Brother   . Diabetes Sister        amputation  . Hypertension Mother   . Stroke Father   . Kidney cancer Neg Hx   . Bladder Cancer Neg Hx    Social History   Tobacco Use  . Smoking status: Never Smoker  . Smokeless tobacco: Never Used  Vaping Use  . Vaping Use: Never used  Substance Use Topics  . Alcohol use: No    Alcohol/week: 0.0 standard drinks  . Drug use: No   Current Outpatient Medications  Medication Sig Dispense Refill  . Artificial Saliva (BIOTENE DRY MOUTH MOISTURIZING) SOLN Use as directed 5 mLs in the mouth or throat daily as needed (For dry mouth).     . ENSURE PLUS (ENSURE PLUS) LIQD Take 237 mLs by mouth 2 (two) times daily between meals.     Marland Kitchen HYDROcodone-acetaminophen (NORCO/VICODIN) 5-325 MG tablet TAKE 1 TABLET BY MOUTH TWICE A DAY 180 tablet 0  . hyoscyamine (LEVSIN) 0.125 MG tablet TAKE 1  TABLET BY MOUTH 3 TIMES DAILY AS NEEDED 90 tablet 3  . levothyroxine (SYNTHROID) 88 MCG tablet TAKE 1 TAB BY MOUTH ONCE DAILY. TAKE ON AN EMPTY STOMACH WITH A GLASS OF WATER ATLEAST 30-60 MINUTES BEFORE BREAKFAST 90 tablet 3  . lisinopril (ZESTRIL) 10 MG tablet TAKE 1 TABLET BY MOUTH DAILY AT BEDTIME 90 tablet 1  . loratadine (CLARITIN REDITABS) 10 MG dissolvable tablet Take 10 mg by mouth daily as needed for allergies.    Marland Kitchen LORazepam (ATIVAN) 0.5 MG tablet TAKE 1/2 TO 1 TABLET BY MOUTH 2 TIMES DAILY AS NEEDED FOR ANXIETY. 40 tablet 1  . mirtazapine (REMERON) 15 MG tablet TAKE ONE  TABLET BY MOUTH AT BEDTIME 90 tablet 3  . ondansetron (ZOFRAN-ODT) 4 MG disintegrating tablet TAKE 1 TABLET BY MOUTH EVERY 6 HOURS AS NEEDED FOR NAUSEA OR VOMITING 20 tablet 2  . vitamin B-12 (CYANOCOBALAMIN) 500 MCG tablet Take 500 mcg by mouth daily.     No current facility-administered medications for this visit.   Allergies  Allergen Reactions  . Bentyl [Dicyclomine Hcl] Other (See Comments)    abd pain  . Ciprofloxacin     Can cause GI upset, esp if taken with metamucil, o/w it is tolerated  . Cortisone Acetate     REACTION: nausea  . Creon [Pancrelipase (Lip-Prot-Amyl)]     Abd pain and nausea.  . Doxycycline Other (See Comments)    GI upset  . Gabapentin     REACTION: severe dizziness  . Metoclopramide Hcl   . Miralax [Polyethylene Glycol]     abd discomfort  . Penicillins     Has patient had a PCN reaction causing immediate rash, facial/tongue/throat swelling, SOB or lightheadedness with hypotension: yes Has patient had a PCN reaction causing severe rash involving mucus membranes or skin necrosis: yes Has patient had a PCN reaction that required hospitalization no Has patient had a PCN reaction occurring within the last 10 years: no If all of the above answers are "NO", then may proceed with Cephalosporin use.   . Promethazine Hcl     REACTION: unspecified  . Remeron [Mirtazapine] Other (See  Comments)    Tolerates 15 mg/day but does not tolerate 30 mg.  Felt jittery with 30 mg dose.  Sarina Ill [Sulfamethoxazole-Trimethoprim] Other (See Comments)    GI upset but not allergy     Review of Systems: All systems reviewed and negative except where noted in HPI.   Lab Results  Component Value Date   WBC 5.0 09/24/2020   HGB 12.0 09/24/2020   HCT 35.2 (L) 09/24/2020   MCV 94.0 09/24/2020   PLT 212.0 09/24/2020    Lab Results  Component Value Date   CREATININE 0.83 09/24/2020   BUN 10 09/24/2020   NA 138 09/24/2020   K 4.4 09/24/2020   CL 98 09/24/2020   CO2 35 (H) 09/24/2020    Lab Results  Component Value Date   ALT 14 09/24/2020   AST 22 09/24/2020   ALKPHOS 58 09/24/2020   BILITOT 0.6 09/24/2020     Physical Exam: Pulse 80   Ht 5\' 2"  (1.575 m)   Wt 85 lb 4 oz (38.7 kg)   LMP 03/28/1989   SpO2 95%   BMI 15.59 kg/m  Constitutional: Pleasant,well-developed, female in no acute distress. Abdominal: Soft, nondistended, nontender, scaphoid abdomen. There are no masses palpable.  Extremities: no edema Lymphadenopathy: No cervical adenopathy noted. Neurological: Alert and oriented to person place and time. Skin: Skin is warm and dry. No rashes noted. Psychiatric: Normal mood and affect. Behavior is normal.   ASSESSMENT AND PLAN: 87 year old female here for reassessment following:  Dyspepsia Nausea Chronic narcotic use Underweight  As above, patient has had an upper endoscopy and 4 CT scans to evaluate her underweight state and abdominal pains, none of which have shown a clear cause.  She does not appear to have any vascular compromise on her imaging.  There was a question of pancreatic atrophy on one of the CT scans however her pancreatic fecal elastase was normal and she had no benefit with Creon.  She has chronic decreased appetite, this is helped some with the Remeron and  she should continue that.  I am concerned about her chronic narcotic use for other  pains, I think this could be playing a role in her poor appetite and chronic nausea.  Long-term it would be in her best interest to come off the narcotics.  She should talk about this with her primary care.  I discussed other options for her.  I will check her basic labs today to make sure stable and no anemia etc.  Zofran clearly does provide some benefit for her so she may want to try to take this scheduled at least twice daily to see if that will help her eat.  I counseled her that she needs to be eating at least 3 meals per day with additional snacks in between.  I am concerned about her social situation, her friend is concerned that she is just not eating enough and lacks significant social support and now having some paranoid delusions.  I will touch base with her primary care physician to discuss some options, she would benefit from social work assistance.  Other things we could try would be empiric omeprazole 20 mg a day for 2 months to see if she finds a benefit to treat dyspepsia.  She had been on this for years ago but cannot recall if it helped or not.  We also gave her free samples of FD guard to use as needed with meals.  She agreed with the plan, will follow-up in a few months for reassessment:  Plan: - CBC, CMET, lipase - trial of omeprazole 20mg  / day for 2 months - Zofran - refill, scheduled BID and as needed - FD gard samples to use with meals - try to avoid narcotics, long term would be best to get her off this regimen, I think may be driving some of her nausea and decreased appetite - eat 3 meals per day.  Social situation as above, does not sound like she might be fit to live at home alone anymore, difficult situation she has no family that can help take care of her, will discuss with her primary care.  Lovejoy Cellar, MD [facility]

## 2021-04-15 ENCOUNTER — Telehealth: Payer: Self-pay | Admitting: Family Medicine

## 2021-04-15 ENCOUNTER — Telehealth: Payer: Self-pay | Admitting: *Deleted

## 2021-04-15 NOTE — Telephone Encounter (Signed)
Thanks.  I sent a secure note back to SW about the situation.  I thank all involved.

## 2021-04-15 NOTE — Telephone Encounter (Signed)
PA for ondansetron 4mg  approved through 04-14-2022. [employer] Between [place]: [phone] 8am to 8pm.. Member id: [id number]

## 2021-04-15 NOTE — Telephone Encounter (Signed)
Please check with patient.  See if she will take a call from social work (she turned down the initial call today).  Please tell her it was a suggestion from me and from her GI doc.  It shouldn't cost her anything.    Either way, let me know.  Thanks.

## 2021-04-15 NOTE — Telephone Encounter (Signed)
   Telephone encounter was:  Successful.  04/15/2021 Name: [name] MRN: [id number] DOB: [birth year]  [name] is a 87 y.o. year old female who is a primary care patient of [name], MD . The community resource team was consulted for assistance with Transportation Needs , Food Insecurity, Home Modifications, Caregiver Stress and Financial Difficulties related to meals and utilities  Care guide performed the following interventions: Patient provided with information about care guide support team and interviewed to confirm resource needs.  Follow Up Plan:  Care guide will outreach resources to assist patient with after talking to provider  [place], Care Management  [phone] [address] , [place] [address] Email : [email or web address]

## 2021-04-15 NOTE — Telephone Encounter (Signed)
Patient is agreeable and she was worried about the price, that is why she declined. After telling her the below, she has reconsidered.

## 2021-04-17 ENCOUNTER — Telehealth: Payer: Self-pay | Admitting: Family Medicine

## 2021-04-17 NOTE — Telephone Encounter (Signed)
Please see about getting patient an OV scheduled when possible.  We need to see about options re: her pain medicine, in case her current pain meds are decreasing her appetite.  Thanks.

## 2021-04-18 NOTE — Telephone Encounter (Signed)
Called patient and scheduled appt for 04/26/21 at 11:00 am.

## 2021-04-26 ENCOUNTER — Encounter: Payer: Self-pay | Admitting: Family Medicine

## 2021-04-26 ENCOUNTER — Other Ambulatory Visit: Payer: Self-pay

## 2021-04-26 ENCOUNTER — Ambulatory Visit (INDEPENDENT_AMBULATORY_CARE_PROVIDER_SITE_OTHER): Payer: Medicare Other | Admitting: Family Medicine

## 2021-04-26 DIAGNOSIS — Z659 Problem related to unspecified psychosocial circumstances: Secondary | ICD-10-CM

## 2021-04-26 DIAGNOSIS — R634 Abnormal weight loss: Secondary | ICD-10-CM

## 2021-04-26 MED ORDER — HYDROCODONE-ACETAMINOPHEN 5-325 MG PO TABS
ORAL_TABLET | ORAL | Status: DC
Start: 2021-04-26 — End: 2021-05-16

## 2021-04-26 NOTE — Progress Notes (Signed)
This visit occurred during the SARS-CoV-2 public health emergency.  Safety protocols were in place, including screening questions prior to the visit, additional usage of staff PPE, and extensive cleaning of exam room while observing appropriate contact time as indicated for disinfecting solutions.  GI notes and home situation d/w pt.  We talked about getting 3 meals a day with extra ensure.  D/w pt about getting SW to help patient with concerns.  D/w pt about pain medicine.  She had used hydrocodone at baseline for back pain.  That is long standing.  She is trying to be as active as possible.  D/w pt about dec from 2 to 1.5 tabs of hydrocodone a day.    Abd discomfort seems to be better on omeprazole per patient report.  She reports eating breakfast, no pain now.  No fevers.  She has some gas pain occ, per patient report.  She feels better after passing flatus.    We talked about a phone call she got this AM.  She thought it was a scam.  This sounds like it was not a call from the clinic or SW.  D/w pt.  She is worried about a person previously breaking into her house.  D/w pt- no known current threat.  D/w pt about memory.  She came to appointment today on her own.  She doesn't like driving in [place].  She uneventfully drove to the clinic.  Oriented to year month and day.  3/3 recall, can do math and read a watch.    Meds, vitals, and allergies reviewed.   ROS: Per HPI unless specifically indicated in ROS section   GEN: nad, alert and oriented, thin elderly lady.  She is worried about getting scam phone calls but a judgment appears intact otherwise.  Her speech is fluent and she is pleasant in conversation. HEENT: ncat NECK: supple w/o LA CV: rrr.   PULM: ctab, no inc wob ABD: soft, +bs, not tender to palpation. EXT: no edema SKIN: Well-perfused.

## 2021-04-26 NOTE — Patient Instructions (Signed)
Try gradually cutting back on the pain medicine.  Take 1/2 tab in the AM and 1 in the PM.  I'll check with social work about getting them to call you.  You can always verify the person's name with [name] here at the clinic.  Take care.  Glad to see you.

## 2021-04-27 NOTE — Assessment & Plan Note (Signed)
She is worried about scam phone calls.  We talked about options.  I will check with social work and see about getting them to call patient.  We talked about her verifying the identity of the person when she does get a call.  She can call back to the clinic here if needed.

## 2021-04-27 NOTE — Telephone Encounter (Signed)
I talked with patient at recent office visit.  Thank you for your help.  Please call the patient and let her know that I sent you a note in the meantime.  She is worried about getting scam phone calls.  If she would need to verify your name at the clinic, then please let us know or have her call the clinic.  Thanks.

## 2021-04-27 NOTE — Assessment & Plan Note (Signed)
Previous GI notes discussed.  Not having abdominal pain.  It seems reasonable to continue omeprazole and I greatly appreciate GI input.  She will try to decrease her hydrocodone use to see if her back pain is still manageable.  She will decrease by half a tablet per day and take 1/2 tablet in the morning and 1 tablet at night.  We will go from there.  She agrees with plan.  30 minutes were devoted to patient care in this encounter (this includes time spent reviewing the patient's file/history, interviewing and examining the patient, counseling/reviewing plan with patient).

## 2021-05-03 ENCOUNTER — Other Ambulatory Visit: Payer: Self-pay

## 2021-05-03 ENCOUNTER — Ambulatory Visit (INDEPENDENT_AMBULATORY_CARE_PROVIDER_SITE_OTHER): Payer: Medicare Other

## 2021-05-03 DIAGNOSIS — Z0000 Encounter for general adult medical examination without abnormal findings: Secondary | ICD-10-CM

## 2021-05-03 NOTE — Progress Notes (Signed)
PCP notes:  Health Maintenance: Prevnar 13- declined covid booster- declined shingrix- declined  dexa- declined    Abnormal Screenings: none   Patient concerns: none   Nurse concerns: none   Next PCP appt.: none

## 2021-05-03 NOTE — Progress Notes (Signed)
Subjective:   [name] is a 87 y.o. female who presents for Medicare Annual (Subsequent) preventive examination.  Review of Systems: N/A      I connected with the patient today by telephone and verified that I am speaking with the correct person using two identifiers. Location patient: home Location nurse: work Persons participating in the telephone visit: patient, nurse.   I discussed the limitations, risks, security and privacy concerns of performing an evaluation and management service by telephone and the availability of in person appointments. I also discussed with the patient that there may be a patient responsible charge related to this service. The patient expressed understanding and verbally consented to this telephonic visit.        Cardiac Risk Factors include: advanced age (>34men, >38 women);hypertension     Objective:    Today's Vitals   There is no height or weight on file to calculate BMI.  Advanced Directives 05/03/2021 05/25/2020 05/14/2020 10/21/2019 09/20/2017 03/10/2017 01/09/2017  Does Patient Have a Medical Advance Directive? No No No No No No No  Would patient like information on creating a medical advance directive? No - Patient declined No - Patient declined - No - Patient declined Yes (MAU/Ambulatory/Procedural Areas - Information given) - -    Current Medications (verified) Outpatient Encounter Medications as of 05/03/2021  Medication Sig  . Artificial Saliva (BIOTENE DRY MOUTH MOISTURIZING) SOLN Use as directed 5 mLs in the mouth or throat daily as needed (For dry mouth).   Gladis Riffle Oil-Levomenthol (FDGARD) 25-20.75 MG CAPS Use as directed as needed  . ENSURE PLUS (ENSURE PLUS) LIQD Take 237 mLs by mouth 2 (two) times daily between meals.   Marland Kitchen HYDROcodone-acetaminophen (NORCO/VICODIN) 5-325 MG tablet 0.5 tab in the AM and 1 in the PM.  . hyoscyamine (LEVSIN) 0.125 MG tablet TAKE 1 TABLET BY MOUTH 3 TIMES DAILY AS NEEDED  . levothyroxine  (SYNTHROID) 88 MCG tablet TAKE 1 TAB BY MOUTH ONCE DAILY. TAKE ON AN EMPTY STOMACH WITH A GLASS OF WATER ATLEAST 30-60 MINUTES BEFORE BREAKFAST  . lisinopril (ZESTRIL) 10 MG tablet TAKE 1 TABLET BY MOUTH DAILY AT BEDTIME  . loratadine (CLARITIN REDITABS) 10 MG dissolvable tablet Take 10 mg by mouth daily as needed for allergies.  Marland Kitchen LORazepam (ATIVAN) 0.5 MG tablet TAKE 1/2 TO 1 TABLET BY MOUTH 2 TIMES DAILY AS NEEDED FOR ANXIETY.  . mirtazapine (REMERON) 15 MG tablet TAKE ONE TABLET BY MOUTH AT BEDTIME  . omeprazole (PRILOSEC) 20 MG capsule Take 1 capsule (20 mg total) by mouth daily.  . ondansetron (ZOFRAN-ODT) 4 MG disintegrating tablet Take 1 tablet (4 mg total) by mouth 2 (two) times daily.  . vitamin B-12 (CYANOCOBALAMIN) 500 MCG tablet Take 500 mcg by mouth daily.   No facility-administered encounter medications on file as of 05/03/2021.    Allergies (verified) Bentyl [dicyclomine hcl], Ciprofloxacin, Cortisone acetate, Creon [pancrelipase (lip-prot-amyl)], Doxycycline, Gabapentin, Metoclopramide hcl, Miralax [polyethylene glycol], Penicillins, Promethazine hcl, Remeron [mirtazapine], and Septra [sulfamethoxazole-trimethoprim]   History: Past Medical History:  Diagnosis Date  . Anxiety   . Barrett esophagus   . Basal cell carcinoma   . Diplopia    history of,hospital 2/26-2/27/10  . Diverticulitis    colonoscopy 1995.  CT 01/31/09, 02/02/09  . Hard of hearing   . Hiatal hernia   . Hiatal hernia    EGD 1998, 2007 ( Dr. Sharlett Iles)  . Hyperlipemia   . Hypertension   . Hypothyroidism   . Osteoporosis DXA 2015  .  Personal history of colonic polyps    colonoscpy, removed 1995  . Thyroid carcinoma ([place]) 03-04-1994  . UTI (urinary tract infection)    Chronic   Past Surgical History:  Procedure Laterality Date  . ABDOMINAL HYSTERECTOMY    . CHOLECYSTECTOMY    . HIP SURGERY  08/1993   due to fracture of right hip  . SKIN CANCER EXCISION     basal cell left hand, forehead  .  THYROIDECTOMY  04/1994   thyroid cancer   Family History  Problem Relation Age of Onset  . Cancer Brother        Pharyngeal   . Heart disease Brother        MI  . Diabetes Brother   . Diabetes Sister        amputation  . Hypertension Mother   . Stroke Father   . Kidney cancer Neg Hx   . Bladder Cancer Neg Hx    Social History   Socioeconomic History  . Marital status: Widowed    Spouse name: Not on file  . Number of children: 0  . Years of education: Not on file  . Highest education level: Not on file  Occupational History  . Occupation: retired    Comment: Publishing copy closed 1995  Tobacco Use  . Smoking status: Never Smoker  . Smokeless tobacco: Never Used  Vaping Use  . Vaping Use: Never used  Substance and Sexual Activity  . Alcohol use: No    Alcohol/week: 0.0 standard drinks  . Drug use: No  . Sexual activity: Not on file  Other Topics Concern  . Not on file  Social History Narrative   Widowed.  Final sib died in 2013-03-04   1 step son out of state, in [place] alone, drives as of 2979   Social Determinants of Health   Financial Resource Strain: Low Risk   . Difficulty of Paying Living Expenses: Not hard at all  Food Insecurity: No Food Insecurity  . Worried About Charity fundraiser in the Last Year: Never true  . Ran Out of Food in the Last Year: Never true  Transportation Needs: No Transportation Needs  . Lack of Transportation (Medical): No  . Lack of Transportation (Non-Medical): No  Physical Activity: Inactive  . Days of Exercise per Week: 0 days  . Minutes of Exercise per Session: 0 min  Stress: Stress Concern Present  . Feeling of Stress : To some extent  Social Connections: Not on file    Tobacco Counseling Counseling given: Not Answered   Clinical Intake:  Pre-visit preparation completed: Yes  Pain : No/denies pain     Nutritional Risks: None Diabetes: No  How often do you need to have someone help you when you  read instructions, pamphlets, or other written materials from your doctor or pharmacy?: 1 - Never What is the last grade level you completed in school?: 10th  Diabetic: No Nutrition Risk Assessment:  Has the patient had any N/V/D within the last 2 months?  No  Does the patient have any non-healing wounds?  No  Has the patient had any unintentional weight loss or weight gain?  No   Diabetes:  Is the patient diabetic?  No  If diabetic, was a CBG obtained today?  N/A Did the patient bring in their glucometer from home?  N/A How often do you monitor your CBG's? N/A.   Financial Strains and Diabetes Management:  Are you having any  financial strains with the device, your supplies or your medication? N/A.  Does the patient want to be seen by Chronic Care Management for management of their diabetes?  N/A Would the patient like to be referred to a Nutritionist or for Diabetic Management?  N/A    Interpreter Needed?: No  Information entered by :: CJohnson, LPN   Activities of Daily Living In your present state of health, do you have any difficulty performing the following activities: 05/03/2021 05/25/2020  Hearing? N Y  Vision? N N  Difficulty concentrating or making decisions? N N  Walking or climbing stairs? N Y  Dressing or bathing? N N  Doing errands, shopping? N N  Preparing Food and eating ? N -  Using the Toilet? N -  In the past six months, have you accidently leaked urine? N -  Do you have problems with loss of bowel control? N -  Managing your Medications? N -  Managing your Finances? N -  Housekeeping or managing your Housekeeping? N -  Some recent data might be hidden    Patient Care Team: [name], MD as PCP - General (Family Medicine)  Indicate any recent Medical Services you may have received from other than [facility] providers in the past year (date may be approximate).     Assessment:   This is a routine wellness examination for  [place].  Hearing/Vision screen  Hearing Screening   125Hz  250Hz  500Hz  1000Hz  2000Hz  3000Hz  4000Hz  6000Hz  8000Hz   Right ear:           Left ear:           Vision Screening Comments: Patient gets annual eye exams   Dietary issues and exercise activities discussed: Current Exercise Habits: The patient does not participate in regular exercise at present, Exercise limited by: None identified  Goals Addressed            This Visit's Progress   . Patient Stated       05/03/2021, I will maintain and continue medications as prescribed.       Depression Screen PHQ 2/9 Scores 05/03/2021 10/29/2020 10/21/2019 10/15/2018 09/20/2017 09/19/2016 09/17/2015  PHQ - 2 Score 2 0 0 0 0 1 0  PHQ- 9 Score 2 - 0 - 0 - -    Fall Risk Fall Risk  05/03/2021 10/29/2020 10/21/2019 10/15/2018 09/20/2017  Falls in the past year? 0 0 0 0 No  Number falls in past yr: 0 0 0 - -  Injury with Fall? 0 0 0 - -  Risk for fall due to : Medication side effect;Impaired mobility - Medication side effect - -  Follow up Falls evaluation completed;Falls prevention discussed Falls evaluation completed Falls evaluation completed;Falls prevention discussed - -    FALL RISK PREVENTION PERTAINING TO THE HOME:  Any stairs in or around the home? Yes  If so, are there any without handrails? No  Home free of loose throw rugs in walkways, pet beds, electrical cords, etc? Yes  Adequate lighting in your home to reduce risk of falls? Yes   ASSISTIVE DEVICES UTILIZED TO PREVENT FALLS:  Life alert? No  Use of a cane, walker or w/c? Yes  Grab bars in the bathroom? No  Shower chair or bench in shower? Yes  Elevated toilet seat or a handicapped toilet? No   TIMED UP AND GO:  Was the test performed? N/A telephone visit .    Cognitive Function: MMSE - Mini Mental State Exam 05/03/2021 10/21/2019 09/20/2017  Orientation to time 5 5 5   Orientation to Place 5 5 5   Registration 3 3 3   Attention/ Calculation 5 5 0  Recall 3 3 3    Language- name 2 objects - - 0  Language- repeat 1 1 1   Language- follow 3 step command - - 3  Language- read & follow direction - - 0  Write a sentence - - 0  Copy design - - 0  Total score - - 20  Mini Cog  Mini-Cog screen was completed. Maximum score is 22. A value of 0 denotes this part of the MMSE was not completed or the patient failed this part of the Mini-Cog screening.       Immunizations Immunization History  Administered Date(s) Administered  . Fluad Quad(high Dose 65+) 10/28/2019  . Influenza Split 09/05/2011, 09/23/2012  . Influenza Whole 09/20/2007, 08/25/2009, 09/27/2010  . Influenza,inj,Quad PF,6+ Mos 08/14/2013, 08/24/2014, 09/17/2015, 11/02/2016, 09/25/2017, 10/15/2018  . PFIZER(Purple Top)SARS-COV-2 Vaccination 01/05/2020, 01/29/2020  . Pneumococcal Polysaccharide-23 05/20/2012  . Td 02/22/2012  . Zoster, Live 09/13/2010    TDAP status: Up to date  Flu Vaccine status: due Fall 2022  Pneumococcal vaccine status: Declined,  Education has been provided regarding the importance of this vaccine but patient still declined. Advised may receive this vaccine at local pharmacy or Health Dept. Aware to provide a copy of the vaccination record if obtained from local pharmacy or Health Dept. Verbalized acceptance and understanding.   Covid-19 vaccine status: 2 doses completed booster declined   Qualifies for Shingles Vaccine? Yes   Zostavax completed Yes   Shingrix Completed?: No.    Education has been provided regarding the importance of this vaccine. Patient has been advised to call insurance company to determine out of pocket expense if they have not yet received this vaccine. Advised may also receive vaccine at local pharmacy or Health Dept. Verbalized acceptance and understanding.  Screening Tests Health Maintenance  Topic Date Due  . Pneumococcal Vaccine 46-56 Years old (1 of 4 - PCV13) Never done  . Zoster Vaccines- Shingrix (1 of 2) Never done  . PNA vac Low  Risk Adult (2 of 2 - PCV13) 05/20/2013  . COVID-19 Vaccine (3 - Pfizer risk 4-dose series) 02/26/2020  . INFLUENZA VACCINE  06/27/2021  . TETANUS/TDAP  02/21/2022  . DEXA SCAN  Completed  . HPV VACCINES  Aged Out    Health Maintenance  Health Maintenance Due  Topic Date Due  . Pneumococcal Vaccine 86-107 Years old (1 of 4 - PCV13) Never done  . Zoster Vaccines- Shingrix (1 of 2) Never done  . PNA vac Low Risk Adult (2 of 2 - PCV13) 05/20/2013  . COVID-19 Vaccine (3 - Pfizer risk 4-dose series) 02/26/2020    Colorectal cancer screening: No longer required.   Mammogram status: No longer required due to age.  Bone Density status: declined  Lung Cancer Screening: (Low Dose CT Chest recommended if Age 8-80 years, 30 pack-year currently smoking OR have quit w/in 15 years.) does not qualify.    Additional Screening:  Hepatitis C Screening: does not qualify; Completed N/A  Vision Screening: Recommended annual ophthalmology exams for early detection of glaucoma and other disorders of the eye. Is the patient up to date with their annual eye exam?  Yes  Who is the provider or what is the name of the office in which the patient attends annual eye exams? [facility] If pt is not established with a provider, would they like to be referred to  a provider to establish care? No .   Dental Screening: Recommended annual dental exams for proper oral hygiene  Community Resource Referral / Chronic Care Management: CRR required this visit?  No   CCM required this visit?  No      Plan:     I have personally reviewed and noted the following in the patient's chart:   . Medical and social history . Use of alcohol, tobacco or illicit drugs  . Current medications and supplements including opioid prescriptions.  . Functional ability and status . Nutritional status . Physical activity . Advanced directives . List of other physicians . Hospitalizations, surgeries, and ER visits in previous 12  months . Vitals . Screenings to include cognitive, depression, and falls . Referrals and appointments  In addition, I have reviewed and discussed with patient certain preventive protocols, quality metrics, and best practice recommendations. A written personalized care plan for preventive services as well as general preventive health recommendations were provided to patient.   Due to this being a telephonic visit, the after visit summary with patients personalized plan was offered to patient via office or my-chart. Patient preferred to pick up at office at next visit or via mychart.   Andrez Grime, LPN   [id number]

## 2021-05-03 NOTE — Patient Instructions (Addendum)
[name] , Thank you for taking time to come for your Medicare Wellness Visit. I appreciate your ongoing commitment to your health goals. Please review the following plan we discussed and let me know if I can assist you in the future.   Screening recommendations/referrals: Colonoscopy: no longer required  Mammogram: no longer required  Bone Density: declined Recommended yearly ophthalmology/optometry visit for glaucoma screening and checkup Recommended yearly dental visit for hygiene and checkup  Vaccinations: Influenza vaccine: due Fall 2022 Pneumococcal vaccine: declined Tdap vaccine: Up to date, completed 02/22/2012, due 01/2022 Shingles vaccine: declined   Covid-19:2 doses completed booster declined   Advanced directives: Advance directive discussed with you today. Even though you declined this today please call our office should you change your mind and we can give you the proper paperwork for you to fill out.   Conditions/risks identified: hypertension   Next appointment: Follow up in one year for your annual wellness visit    Preventive Care 85 Years and Older, Female Preventive care refers to lifestyle choices and visits with your health care provider that can promote health and wellness. What does preventive care include?  A yearly physical exam. This is also called an annual well check.  Dental exams once or twice a year.  Routine eye exams. Ask your health care provider how often you should have your eyes checked.  Personal lifestyle choices, including:  Daily care of your teeth and gums.  Regular physical activity.  Eating a healthy diet.  Avoiding tobacco and drug use.  Limiting alcohol use.  Practicing safe sex.  Taking low-dose aspirin every day.  Taking vitamin and mineral supplements as recommended by your health care provider. What happens during an annual well check? The services and screenings done by your health care provider during your  annual well check will depend on your age, overall health, lifestyle risk factors, and family history of disease. Counseling  Your health care provider may ask you questions about your:  Alcohol use.  Tobacco use.  Drug use.  Emotional well-being.  Home and relationship well-being.  Sexual activity.  Eating habits.  History of falls.  Memory and ability to understand (cognition).  Work and work Statistician.  Reproductive health. Screening  You may have the following tests or measurements:  Height, weight, and BMI.  Blood pressure.  Lipid and cholesterol levels. These may be checked every 5 years, or more frequently if you are over 85 years old.  Skin check.  Lung cancer screening. You may have this screening every year starting at age 85 if you have a 30-pack-year history of smoking and currently smoke or have quit within the past 15 years.  Fecal occult blood test (FOBT) of the stool. You may have this test every year starting at age 85.  Flexible sigmoidoscopy or colonoscopy. You may have a sigmoidoscopy every 5 years or a colonoscopy every 10 years starting at age 85.  Hepatitis C blood test.  Hepatitis B blood test.  Sexually transmitted disease (STD) testing.  Diabetes screening. This is done by checking your blood sugar (glucose) after you have not eaten for a while (fasting). You may have this done every 1-3 years.  Bone density scan. This is done to screen for osteoporosis. You may have this done starting at age 85.  Mammogram. This may be done every 1-2 years. Talk to your health care provider about how often you should have regular mammograms. Talk with your health care provider about your test results,  treatment options, and if necessary, the need for more tests. Vaccines  Your health care provider may recommend certain vaccines, such as:  Influenza vaccine. This is recommended every year.  Tetanus, diphtheria, and acellular pertussis (Tdap, Td)  vaccine. You may need a Td booster every 10 years.  Zoster vaccine. You may need this after age 85.  Pneumococcal 13-valent conjugate (PCV13) vaccine. One dose is recommended after age 85.  Pneumococcal polysaccharide (PPSV23) vaccine. One dose is recommended after age 85. Talk to your health care provider about which screenings and vaccines you need and how often you need them. This information is not intended to replace advice given to you by your health care provider. Make sure you discuss any questions you have with your health care provider. Document Released: 12/10/2015 Document Revised: 08/02/2016 Document Reviewed: 09/14/2015 Elsevier Interactive Patient Education  2017 [place] Prevention in the Home Falls can cause injuries. They can happen to people of all ages. There are many things you can do to make your home safe and to help prevent falls. What can I do on the outside of my home?  Regularly fix the edges of walkways and driveways and fix any cracks.  Remove anything that might make you trip as you walk through a door, such as a raised step or threshold.  Trim any bushes or trees on the path to your home.  Use bright outdoor lighting.  Clear any walking paths of anything that might make someone trip, such as rocks or tools.  Regularly check to see if handrails are loose or broken. Make sure that both sides of any steps have handrails.  Any raised decks and porches should have guardrails on the edges.  Have any leaves, snow, or ice cleared regularly.  Use sand or salt on walking paths during winter.  Clean up any spills in your garage right away. This includes oil or grease spills. What can I do in the bathroom?  Use night lights.  Install grab bars by the toilet and in the tub and shower. Do not use towel bars as grab bars.  Use non-skid mats or decals in the tub or shower.  If you need to sit down in the shower, use a plastic, non-slip  stool.  Keep the floor dry. Clean up any water that spills on the floor as soon as it happens.  Remove soap buildup in the tub or shower regularly.  Attach bath mats securely with double-sided non-slip rug tape.  Do not have throw rugs and other things on the floor that can make you trip. What can I do in the bedroom?  Use night lights.  Make sure that you have a light by your bed that is easy to reach.  Do not use any sheets or blankets that are too big for your bed. They should not hang down onto the floor.  Have a firm chair that has side arms. You can use this for support while you get dressed.  Do not have throw rugs and other things on the floor that can make you trip. What can I do in the kitchen?  Clean up any spills right away.  Avoid walking on wet floors.  Keep items that you use a lot in easy-to-reach places.  If you need to reach something above you, use a strong step stool that has a grab bar.  Keep electrical cords out of the way.  Do not use floor polish or wax that makes floors  slippery. If you must use wax, use non-skid floor wax.  Do not have throw rugs and other things on the floor that can make you trip. What can I do with my stairs?  Do not leave any items on the stairs.  Make sure that there are handrails on both sides of the stairs and use them. Fix handrails that are broken or loose. Make sure that handrails are as long as the stairways.  Check any carpeting to make sure that it is firmly attached to the stairs. Fix any carpet that is loose or worn.  Avoid having throw rugs at the top or bottom of the stairs. If you do have throw rugs, attach them to the floor with carpet tape.  Make sure that you have a light switch at the top of the stairs and the bottom of the stairs. If you do not have them, ask someone to add them for you. What else can I do to help prevent falls?  Wear shoes that:  Do not have high heels.  Have rubber bottoms.  Are  comfortable and fit you well.  Are closed at the toe. Do not wear sandals.  If you use a stepladder:  Make sure that it is fully opened. Do not climb a closed stepladder.  Make sure that both sides of the stepladder are locked into place.  Ask someone to hold it for you, if possible.  Clearly mark and make sure that you can see:  Any grab bars or handrails.  First and last steps.  Where the edge of each step is.  Use tools that help you move around (mobility aids) if they are needed. These include:  Canes.  Walkers.  Scooters.  Crutches.  Turn on the lights when you go into a dark area. Replace any light bulbs as soon as they burn out.  Set up your furniture so you have a clear path. Avoid moving your furniture around.  If any of your floors are uneven, fix them.  If there are any pets around you, be aware of where they are.  Review your medicines with your doctor. Some medicines can make you feel dizzy. This can increase your chance of falling. Ask your doctor what other things that you can do to help prevent falls. This information is not intended to replace advice given to you by your health care provider. Make sure you discuss any questions you have with your health care provider. Document Released: 09/09/2009 Document Revised: 04/20/2016 Document Reviewed: 12/18/2014 Elsevier Interactive Patient Education  2017 [place].   Opioid Pain Medicine Management Opioid pain medicines are strong medicines that are used to treat bad or very bad pain. When you take them for a short time, they can help you:  Sleep better.  Do better in physical therapy.  Feel better during the first few days after you get hurt.  Recover from surgery. Only take these medicines if a doctor says that you can. You should only take them for a short time. This is because opioids can be hard to stop taking (they are addictive). The longer you take opioids, the harder it may be to stop  taking them (opioid use disorder). What are the risks? Opioids can cause problems (side effects). Taking them for more than 3 days raises your chance of problems, such as:  Trouble pooping (constipation).  Feeling sick to your stomach (nausea).  Vomiting.  Feeling very sleepy.  Confusion.  Not being able to stop taking  the medicine.  Breathing problems. Taking opioids for a long time can make it hard for you to do daily tasks. It can also put you at risk for:  Car accidents.  Depression.  Suicide.  Heart attack.  Taking too much of the medicine (overdose), which can sometimes lead to death. What is a pain treatment plan? A pain treatment plan is a plan made by you and your doctor. Work with your doctor to make a plan for treating your pain. To help you do this:  Talk about the goals of your treatment, including: ? How much pain you might expect to have. ? How you will manage the pain.  Talk about the risks and benefits of taking these medicines for your condition.  Remember that a good treatment plan uses more than one approach and lowers the risks of side effects.  Tell your doctor about the amount of medicines you take and about any drug or alcohol use.  Get your pain medicine prescriptions from only one doctor. Pain can be managed with other treatments. Work with your doctor to find other ways to help your pain, such as:  Physical therapy.  Counseling.  Eating healthy foods.  Brain exercises.  Massage.  Meditation.  Other pain medicines.  Doing gentle exercises. Tapering your use of opioids If you have been taking opioids for more than a few weeks, you may need to slowly decrease (taper) how much you take until you stop taking them. Doing this can lower your chance of having symptoms, such as:  Pain and cramping in your belly (abdomen).  Feeling sick to your stomach.  Sweating.  Feeling very sleepy.  Feeling restless.  Shaking you cannot  control (tremors).  Cravings for the medicine. Do not try to stop taking them by yourself. Work with your doctor to stop. Your doctor will help you take less until you are not taking the medicine at all. Follow these instructions at home: Safety and storage  While you are taking opioids: ? Do not drive. ? Do not use machines or power tools. ? Do not sign important papers (legal documents). ? Do not drink alcohol. ? Do not take sleeping pills. ? Do not take care of children by yourself. ? Do not do activities where you need to climb or be in high places, like working on a ladder. ? Do not go into any water, such as a lake, river, ocean, swimming pool, or hot tub.  Keep your opioids locked up or in a place where children cannot reach them.  Do not share your pain medicine with anyone.   Getting rid of leftover pills Do not save any leftover pills. Get rid of leftover pills safely by:  Taking them to a take-back program in your area.  Bringing them to a pharmacy that has a container for throwing away pills (pill disposal).  Flushing them down the toilet. Check the label or package insert of your medicine to see whether this is safe to do.  Throwing them in the trash. Check the label or package insert of your medicine to see whether this is safe to do. If it is safe to throw them out: 1. Take the pills out of their container. 2. Put the pills into a container you can seal. 3. Mix the pills with used coffee grounds, food scraps, dirt, or cat litter. 4. Put this in the trash. Activity  Return to your normal activities as told by your doctor. Ask your doctor what  activities are safe for you.  Avoid doing things that make your pain worse.  Do exercises as told by your doctor. General instructions  You may need to take these actions to prevent or treat trouble pooping: ? Drink enough fluid to keep your pee (urine) pale yellow. ? Take over-the-counter or prescription  medicines. ? Eat foods that are high in fiber. These include beans, whole grains, and fresh fruits and vegetables. ? Limit foods that are high in fat and sugar. These include fried or sweet foods.  Keep all follow-up visits as told by your doctor. This is important. Where to find support If you have been taking opioids for a long time, think about getting help quitting from a local support group or counselor. Ask your doctor about this. Where to find more information  Centers for Disease Control and Prevention (CDC): http://www.wolf.info/  U.S. Food and Drug Administration (FDA): [employer] Get help right away if: Seek medical care right away if you are taking opioids and you, or people close to you, notice any of the following:  You have trouble breathing.  Your breathing is slower or more shallow than normal.  You have a very slow heartbeat.  You feel very confused.  You pass out (faint).  You are very sleepy.  Your speech is not normal.  You feel sick to your stomach and vomit.  You have cold skin.  You have blue lips or fingernails.  Your muscles are weak (limp) and your body seems floppy.  The black centers of your eyes (pupils) are smaller than normal. If you think that you or someone else may have taken too much of an opioid medicine, get medical help right away. Call your local emergency services (911 in the U.S.). Do not drive yourself to the hospital. If you ever feel like you may hurt yourself or others, or have thoughts about taking your own life, get help right away. You can go to your nearest emergency department or call:  Your local emergency services (911 in the U.S.).  The hotline of the [facility] 224-208-7536 in the U.S.).  A suicide crisis helpline, such as the [place] at (831) 030-2223. This is open 24 hours a day. Summary  Opioid are strong medicines that are used to treat bad or very bad pain.  A pain  treatment plan is a plan made by you and your doctor. Work with your doctor to make a plan for treating your pain.  Work with your doctor to find other ways to help your pain.  If you think that you or someone else may have taken too much of an opioid, get help right away. This information is not intended to replace advice given to you by your health care provider. Make sure you discuss any questions you have with your health care provider. Document Revised: 09/19/2019 Document Reviewed: 12/13/2018 Elsevier Patient Education  2021 [employer].

## 2021-05-13 ENCOUNTER — Other Ambulatory Visit: Payer: Self-pay | Admitting: Family Medicine

## 2021-05-16 ENCOUNTER — Other Ambulatory Visit: Payer: Self-pay | Admitting: Family Medicine

## 2021-05-16 MED ORDER — HYDROCODONE-ACETAMINOPHEN 5-325 MG PO TABS: ORAL_TABLET | ORAL | 0 refills | Status: DC

## 2021-05-16 NOTE — Telephone Encounter (Signed)
Landon called from [facility] and will decrease the quantity to 135 from 180 for the 90 days since she is to decrease he dosage.

## 2021-05-16 NOTE — Progress Notes (Signed)
Rx sent for hydrocodone.  See if she can gradually taper her dose down by 1/2 tab from current dosing (ie occ take 1/2 tab twice a day= 1 full tab a day instead of 1.5 tabs a day).  Thanks.

## 2021-05-16 NOTE — Telephone Encounter (Addendum)
Pt called the office to find out why Dr [name] had not refilled her medication as requested Friday morning. She is out of medication. I advised her there is a 72 hour window for the refills. I called [facility] and spoke to Rosalia to verify when she last got the Rx. It was 02-11-21 #180. High priority Secure Epic Message sent to Dr [name].

## 2021-05-17 NOTE — Telephone Encounter (Signed)
Duplicate request that EMR will not let me deny.

## 2021-05-18 NOTE — Telephone Encounter (Signed)
Noted. Thanks.

## 2021-05-19 ENCOUNTER — Other Ambulatory Visit: Payer: Self-pay | Admitting: Family Medicine

## 2021-05-19 NOTE — Telephone Encounter (Signed)
Refill request for Lorazepam 0.5 mg tablets  LOV - 04/26/21 Next OV - not scheduled Last refill - 02/22/21 #40/1

## 2021-05-26 ENCOUNTER — Telehealth: Payer: Self-pay | Admitting: *Deleted

## 2021-05-26 NOTE — Telephone Encounter (Signed)
   Telephone encounter was:  Successful.  05/26/2021 Name: [name] MRN: [id number] DOB: 03/11/1930  [name] is a 87 y.o. year old female who is a primary care patient of [name], MD . The community resource team was consulted for assistance with patient was much more receptive this time I provided community resource information made aware of transportaion and meal resources she has givne me permission to put her on [facility] waitlist   Care guide performed the following interventions: Follow up call placed to community resources to determine status of patients referral.  Follow Up Plan:  Care guide will follow up with patient by phone over the next day [place], Care Management  [phone] [address] , [place] [address] Email : Ashby Dawes. [email or web address] [email or web address]

## 2021-05-27 ENCOUNTER — Telehealth: Payer: Self-pay | Admitting: *Deleted

## 2021-05-27 NOTE — Telephone Encounter (Signed)
   Telephone encounter was:  Successful.  05/27/2021 Name: [name] MRN: [id number] DOB: 06-13-1930  [name] is a 87 y.o. year old female who is a primary care patient of [name], MD . The community resource team was consulted for assistance with Transportation Needs  and [place] guide performed the following interventions: Patient provided with information about care guide support team and interviewed to confirm resource needs.  Follow Up Plan:  No further follow up planned at this time. The patient has been provided with needed resources.  [place], Care Management  [phone] [address] , [place] [address] Email : Ashby Dawes. [email or web address]

## 2021-06-24 ENCOUNTER — Other Ambulatory Visit: Payer: Self-pay | Admitting: Gastroenterology

## 2021-06-24 ENCOUNTER — Other Ambulatory Visit: Payer: Self-pay | Admitting: Family Medicine

## 2021-06-24 NOTE — Telephone Encounter (Signed)
Last refilled on 05/20/21 # 40 with 1 refill LOV f/u 04/26/21  UDS 05/04/20  No future OV appt

## 2021-06-26 NOTE — Telephone Encounter (Signed)
Sent for next refill. Thanks.

## 2021-06-28 ENCOUNTER — Telehealth: Payer: Self-pay | Admitting: *Deleted

## 2021-06-28 NOTE — Telephone Encounter (Signed)
   Telephone encounter was:  Successful.  06/28/2021 Name: [name] MRN: [id number] DOB: 09/30/1930  [name] is a 87 y.o. year old female who is a primary care patient of [name], MD . The community resource team was consulted for assistance with Transportation Needs   Care guide performed the following interventions: Patient provided with information about care guide support team and interviewed to confirm resource needs.  Follow Up Plan:  Will schedule transportaion around the 15th of august as that will be 2 weeks in [place] Management  [phone] [address] , [place] [address] Email : Ashby Dawes. Greenauer-moran [email or web address]

## 2021-07-12 ENCOUNTER — Telehealth: Payer: Self-pay | Admitting: *Deleted

## 2021-07-12 NOTE — Telephone Encounter (Signed)
   Telephone encounter was:  Successful.  07/12/2021 Name: [name] MRN: [id number] DOB: Apr 02, 1930  [name] is a 87 y.o. year old female who is a primary care patient of [name], MD . The community resource team was consulted for assistance with Transportation Needs Patient aware transportation is being arranged woth [facility] transportaion request sent today  Care guide performed the following interventions: Patient provided with information about care guide support team and interviewed to confirm resource needs.  Follow Up Plan:  No further follow up planned at this time. The patient has been provided with needed resources. [place], Care Management  [phone] [address] , [place] [address] Email : Ashby Dawes. Greenauer-moran [email or web address]

## 2021-07-13 ENCOUNTER — Telehealth: Payer: Self-pay | Admitting: *Deleted

## 2021-07-13 ENCOUNTER — Ambulatory Visit: Payer: Medicare Other | Admitting: Gastroenterology

## 2021-07-13 ENCOUNTER — Telehealth: Payer: Self-pay | Admitting: Family Medicine

## 2021-07-13 NOTE — Telephone Encounter (Signed)
   [name] DOB: 04/01/1930 MRN: [id number]   RIDER WAIVER AND RELEASE OF LIABILITY  For purposes of improving physical access to our facilities, [facility] is pleased to partner with third parties to provide [place] patients or other authorized individuals the option of convenient, on-demand ground transportation services (the Technical brewer") through use of the technology service that enables users to request on-demand ground transportation from independent third-party providers.  By opting to use and accept these [employer], I, the undersigned, hereby agree on behalf of myself, and on behalf of any minor child using the Government social research officer for whom I am the parent or legal guardian, as follows:  Government social research officer provided to me are provided by independent third-party transportation providers who are not [employer] or employees and who are unaffiliated with [employer]. [place] is neither a transportation carrier nor a common or public carrier. [facility] has no control over the quality or safety of the transportation that occurs as a result of the [employer]. [facility] cannot guarantee that any third-party transportation provider will complete any arranged transportation service. [facility] makes no representation, warranty, or guarantee regarding the reliability, timeliness, quality, safety, suitability, or availability of any of the Transport Services or that they will be error free. I fully understand that traveling by vehicle involves risks and dangers of serious bodily injury, including permanent disability, paralysis, and death. I agree, on behalf of myself and on behalf of any minor child using the Transport Services for whom I am the parent or legal guardian, that the entire risk arising out of my use of the [employer] remains solely with me, to the maximum extent permitted under applicable law. The [employer] are provided  "as is" and "as available." [place] disclaims all representations and warranties, express, implied or statutory, not expressly set out in these terms, including the implied warranties of merchantability and fitness for a particular purpose. I hereby waive and release [facility], its agents, employees, officers, directors, representatives, insurers, attorneys, assigns, successors, subsidiaries, and affiliates from any and all past, present, or future claims, demands, liabilities, actions, causes of action, or suits of any kind directly or indirectly arising from acceptance and use of the [employer]. I further waive and release [facility] and its affiliates from all present and future liability and responsibility for any injury or death to persons or damages to property caused by or related to the use of the [employer]. I have read this Waiver and Release of Liability, and I understand the terms used in it and their legal significance. This Waiver is freely and voluntarily given with the understanding that my right (as well as the right of any minor child for whom I am the parent or legal guardian using the [employer]) to legal recourse against [place] in connection with the [employer] is knowingly surrendered in return for use of these services.   I attest that I read the consent document to [name], gave [name] the opportunity to ask questions and answered the questions asked (if any). I affirm that [name] then provided consent for she's participation in this program.     [name]

## 2021-07-13 NOTE — Telephone Encounter (Signed)
   Telephone encounter was:  Successful.  07/13/2021 Name: RAMONITA CHEREK MRN: [id number] DOB: 1930-11-18  GWENLYN RECALDE is a 87 y.o. year old female who is a primary care patient of [name], MD . The community resource team was consulted for assistance with Transportation Needs Transportaion contacted me about the request patient not answering phone so I called her ad she has been screening calls from scammers so she is looking for their call now   Care guide performed the following interventions: Follow up call placed to the patient to discuss status of referral.  Follow Up Plan:  No further follow up planned at this time. The patient has been provided with needed resources. [place], Care Management  [phone] [address] , [place] [address] Email : [email or web address]

## 2021-07-18 ENCOUNTER — Other Ambulatory Visit: Payer: Self-pay | Admitting: Family Medicine

## 2021-07-18 NOTE — Telephone Encounter (Signed)
Refill request for HYDROcodone-acetaminophen (NORCO/VICODIN) 5-325 MG tablet  LOV - 04/26/21 Next OV - not scheduled Last refill - 05/16/21 #180/0

## 2021-07-19 NOTE — Telephone Encounter (Signed)
Please verify dose with patient.  She was supposed to taper down to 1.5 tabs a day and #135 should last 90 days.  Thanks.

## 2021-07-19 NOTE — Telephone Encounter (Signed)
Lmtcb to verify dose

## 2021-07-20 NOTE — Telephone Encounter (Signed)
Patient states she was trying to taper rx down but hurt her leg almost 2 weeks ago and has been taking 1/2 tablet in the am and 1 tablet in the pm like she had been doing and that helps with her pain. She states she will try to taper down with new rx. She only has about 5 or 6 pills left and needs refilled by the weekend.

## 2021-07-20 NOTE — Telephone Encounter (Signed)
Noted.  Sent.  Thanks. 

## 2021-07-21 NOTE — Telephone Encounter (Signed)
Left message for patient that rx was sent and if needs anything else to call the office.

## 2021-07-26 ENCOUNTER — Ambulatory Visit: Payer: Medicare Other | Admitting: Physician Assistant

## 2021-08-25 ENCOUNTER — Ambulatory Visit (INDEPENDENT_AMBULATORY_CARE_PROVIDER_SITE_OTHER): Payer: Medicare Other | Admitting: Nurse Practitioner

## 2021-08-25 ENCOUNTER — Encounter: Payer: Self-pay | Admitting: Nurse Practitioner

## 2021-08-25 VITALS — BP 152/92 | HR 84 | Ht 62.0 in | Wt 86.0 lb

## 2021-08-25 DIAGNOSIS — K5900 Constipation, unspecified: Secondary | ICD-10-CM

## 2021-08-25 NOTE — Progress Notes (Signed)
08/25/2021 [name] [id number] 02-22-1930   Chief Complaint: Nausea, constipation  History of Present Illness: [name] is an 87 year female with a past medical history of chronic abdominal pain and she remains underweight. She is followed by Dr. Havery Moros.  She presents her office today for follow-up regarding nausea and abdominal pain, she is accompanied by her friend.  She remains on Omeprazole 20 mg daily which she started following her office visit 04/14/2021 and Zofran p.o. twice daily was continued.  Her nausea has significantly improved on this regimen.  She did not tolerate FD guard which resulted in stomach pain so she stopped taking it.  Her weight remains low but is stable at 86 pounds.  She reports eating 2 meals daily and drinks Ensure 2 bottles daily.  She remains on MiraLAX once daily which results in passing a fairly normal formed brown bowel movement for 3 days and she skips 1 or 2 days then strains to pass a bowel movement.  She takes a OTC suppository as needed which facilitates passing a BM.  She denies having any rectal bleeding.  No lower abdominal pain.  She drinks 2 glasses of water daily.  She drinks 1 or 2 cups of coffee daily.  She remains on Hydrocodone one half tab once or twice daily.  CBC Latest Ref Rng & Units 04/14/2021 09/24/2020 09/17/2020  WBC 4.0 - 10.5 K/uL 4.7 5.0 12.7(H)  Hemoglobin 12.0 - 15.0 g/dL 12.5 12.0 13.6  Hematocrit 36.0 - 46.0 % 36.8 35.2(L) 41.2  Platelets 150.0 - 400.0 K/uL 193.0 212.0 218    CMP Latest Ref Rng & Units 04/14/2021 09/24/2020 09/17/2020  Glucose 70 - 99 mg/dL 97 106(H) 119(H)  BUN 6 - 23 mg/dL 14 10 18   Creatinine 0.40 - 1.20 mg/dL 0.79 0.83 1.03(H)  Sodium 135 - 145 mEq/L 139 138 136  Potassium 3.5 - 5.1 mEq/L 4.4 4.4 4.7  Chloride 96 - 112 mEq/L 99 98 95(L)  CO2 19 - 32 mEq/L 35(H) 35(H) 28  Calcium 8.4 - 10.5 mg/dL 9.4 8.8 9.1  Total Protein 6.0 - 8.3 g/dL 7.7 7.0 7.6  Total Bilirubin 0.2 -  1.2 mg/dL 0.8 0.6 1.4(H)  Alkaline Phos 39 - 117 U/L 70 58 57  AST 0 - 37 U/L 20 22 30   ALT 0 - 35 U/L 11 14 14      Colonoscopy in 2012 with finding of severe left-sided diverticulosis, no polyps  EGD in 2012 - Barrett's esophagus no dysplasia (irregular z-line) and chronic atrophic gastric mucosa as well as small hiatal hernia. biopsies showed chronic gastritis H. pylori negative peptic duodenitis . 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 No acute abdominal/pelvic findings, mass lesions or adenopathy. 2. Moderate stool throughout the colon may suggest constipation. 3. Stable mild common bile duct dilatation due to a prior cholecystectomy. 4. Aortic atherosclerosis.   Pancreatic elastase normal > 500 on 05/05/20   Past Medical History:  Diagnosis Date   Anxiety    Barrett esophagus    Basal cell carcinoma    Diplopia    history of,hospital 2/26-2/27/10   Diverticulitis    colonoscopy 1995.  CT 01/31/09, 02/02/09   Hard of hearing    Hiatal hernia    Hiatal hernia    EGD 1998, 2007 ( Dr. Sharlett Iles)   Hyperlipemia    Hypertension    Hypothyroidism  Osteoporosis DXA 2015   Personal history of colonic polyps    colonoscpy, removed 1995   Thyroid carcinoma ([place]) 01/1994   UTI (urinary tract infection)    Chronic   Past Surgical History:  Procedure Laterality Date   ABDOMINAL HYSTERECTOMY     CHOLECYSTECTOMY     HIP SURGERY  08/1993   due to fracture of right hip   SKIN CANCER EXCISION     basal cell left hand, forehead   THYROIDECTOMY  04/1994   thyroid cancer   Current Outpatient Medications on File Prior to Visit  Medication Sig Dispense Refill   Artificial Saliva (BIOTENE DRY MOUTH MOISTURIZING) SOLN Use as directed 5 mLs in the mouth or throat daily as needed (For dry mouth).      ENSURE PLUS (ENSURE PLUS) LIQD Take 237 mLs by mouth 2 (two) times daily  between meals.      HYDROcodone-acetaminophen (NORCO/VICODIN) 5-325 MG tablet TAKE 1/2 TABLET IN THE MORNING AND 1 TABLET BY MOUTH IN THE EVENING 135 tablet 0   hyoscyamine (LEVSIN) 0.125 MG tablet TAKE 1 TABLET BY MOUTH 3 TIMES DAILY AS NEEDED 90 tablet 3   levothyroxine (SYNTHROID) 88 MCG tablet TAKE 1 TAB BY MOUTH ONCE DAILY. TAKE ON AN EMPTY STOMACH WITH A GLASS OF WATER ATLEAST 30-60 MINUTES BEFORE BREAKFAST 90 tablet 3   lisinopril (ZESTRIL) 10 MG tablet TAKE 1 TABLET BY MOUTH DAILY AT BEDTIME 90 tablet 2   loratadine (CLARITIN REDITABS) 10 MG dissolvable tablet Take 10 mg by mouth daily as needed for allergies.     LORazepam (ATIVAN) 0.5 MG tablet TAKE 1/2 TO 1 TABLET BY MOUTH 2 TIMES DAILY AS NEEDED FOR ANXIETY. 40 tablet 1   mirtazapine (REMERON) 15 MG tablet TAKE ONE TABLET BY MOUTH AT BEDTIME 90 tablet 3   omeprazole (PRILOSEC) 20 MG capsule TAKE 1 CAPSULE BY MOUTH ONCE DAILY 60 capsule 0   ondansetron (ZOFRAN-ODT) 4 MG disintegrating tablet Take 1 tablet (4 mg total) by mouth 2 (two) times daily. 60 tablet 5   vitamin B-12 (CYANOCOBALAMIN) 500 MCG tablet Take 500 mcg by mouth daily.     No current facility-administered medications on file prior to visit.   Allergies  Allergen Reactions   Bentyl [Dicyclomine Hcl] Other (See Comments)    abd pain   Ciprofloxacin     Can cause GI upset, esp if taken with metamucil, o/w it is tolerated   Cortisone Acetate     REACTION: nausea   Creon [Pancrelipase (Lip-Prot-Amyl)]     Abd pain and nausea.   Doxycycline Other (See Comments)    GI upset   Gabapentin     REACTION: severe dizziness   Metoclopramide Hcl    Miralax [Polyethylene Glycol]     abd discomfort   Penicillins     Has patient had a PCN reaction causing immediate rash, facial/tongue/throat swelling, SOB or lightheadedness with hypotension: yes Has patient had a PCN reaction causing severe rash involving mucus membranes or skin necrosis: yes Has patient had a PCN reaction  that required hospitalization no Has patient had a PCN reaction occurring within the last 10 years: no If all of the above answers are "NO", then may proceed with Cephalosporin use.    Promethazine Hcl     REACTION: unspecified   Remeron [Mirtazapine] Other (See Comments)    Tolerates 15 mg/day but does not tolerate 30 mg.  Felt jittery with 30 mg dose.   Septra [Sulfamethoxazole-Trimethoprim] Other (See Comments)  GI upset but not allergy   Current Medications, Allergies, Past Medical History, Past Surgical History, Family History and Social History were reviewed in [employer] record.  Review of Systems:   Constitutional: Negative for fever, sweats, chills or weight loss.  Respiratory: Negative for shortness of breath.   Cardiovascular: Negative for chest pain, palpitations and leg swelling.  Gastrointestinal: See HPI.  Musculoskeletal: Negative for back pain or muscle aches.  Neurological: Negative for dizziness, headaches or paresthesias.   Physical Exam: BP (!) 152/92   Pulse 84   Ht 5\' 2"  (1.575 m)   Wt 86 lb (39 kg)   LMP 03/28/1989   SpO2 94%   BMI 15.73 kg/m   Wt Readings from Last 3 Encounters:  08/25/21 86 lb (39 kg)  04/26/21 85 lb 12.8 oz (38.9 kg)  04/14/21 85 lb 4 oz (38.7 kg)    General: Thin petite 87 year old female ambulating with assistance of a cane in no acute distress. Head: Normocephalic and atraumatic. Eyes: No scleral icterus. Conjunctiva pink . Ears: Normal auditory acuity. Mouth: Dentition intact. No ulcers or lesions.  Lungs: Clear throughout to auscultation. Heart: Regular rate and rhythm, no murmur. Abdomen: Soft, nontender and nondistended. No masses or hepatomegaly. Normal bowel sounds x 4 quadrants.  Umbilicus center has dark black firm derm tissue.  Rectal: Deferred.  Musculoskeletal: Symmetrical with no gross deformities. Extremities: No edema. Neurological: Alert oriented x 4. No focal deficits.   Psychological: Alert and cooperative. Normal mood and affect  Assessment and Recommendations:  60) 87 year old female with chronic nausea which has significantly improved on Omeprazole 20 mg daily with continued Zofran 4 mg p.o. twice daily.  FD guard was not tolerated. -Continue Omeprazole 20 mg daily -Continue Zofran 4 mg ODT 1  twice daily -Patient will call our office if her symptoms worsen  2) Chronic abdominal pain with multiple past CT studies which were unrevealing.  No current upper or lower abdominal pain.  3) Constipation -Increase MiraLAX twice daily as needed -Increase water intake to at least four 8 ounce glasses daily -Patient to call office if constipation persists or worsens -Follow-up as needed

## 2021-08-25 NOTE — Patient Instructions (Addendum)
If you are age 85 or older, your body mass index should be between 23-30. Your Body mass index is 15.73 kg/m. If this is out of the aforementioned range listed, please consider follow up with your Primary Care Provider.  RECOMMENDATIONS: Increase water intake to 4 glasses a day. Continue 2 Ensure drinks daily. Increase Miralax to twice a day.  It was great seeing you today! Thank you for entrusting me with your care and choosing [facility].  Noralyn Pick, CRNP  The [facility] providers would like to encourage you to use [facility] to communicate with providers for non-urgent requests or questions.  Due to long hold times on the telephone, sending your provider a message by [facility] may be faster and more efficient way to get a response. Please allow 48 business hours for a response.  Please remember that this is for non-urgent requests/questions.

## 2021-08-25 NOTE — Progress Notes (Signed)
Agree with assessment and plan as outlined.  

## 2021-10-10 ENCOUNTER — Telehealth: Payer: Self-pay | Admitting: Family Medicine

## 2021-10-11 NOTE — Telephone Encounter (Signed)
Refill request for LORazepam (ATIVAN) 0.5 MG tablet  LOV - 04/26/21 Next OV - not scheduled Last refill - 06/26/21 #40/1

## 2021-10-12 NOTE — Telephone Encounter (Signed)
Lvm for pt to call the office to schedule a lab/cpe

## 2021-10-12 NOTE — Telephone Encounter (Signed)
Sent.  Please schedule f/u when possible (likely back at [facility] after we move back) re: weight.  Thanks.

## 2021-10-12 NOTE — Telephone Encounter (Signed)
Pt said that she will call back in Dec to schedule. She wanted to wait til we were differently back in the office before she scheduled

## 2021-10-14 ENCOUNTER — Other Ambulatory Visit: Payer: Self-pay | Admitting: Family Medicine

## 2021-10-21 ENCOUNTER — Other Ambulatory Visit: Payer: Self-pay | Admitting: Gastroenterology

## 2021-10-25 ENCOUNTER — Other Ambulatory Visit: Payer: Self-pay | Admitting: Family Medicine

## 2021-10-25 NOTE — Telephone Encounter (Signed)
Refill request for HYDROcodone-acetaminophen (NORCO/VICODIN) 5-325 MG tablet  LOV - 04/26/21 Next OV - not scheduled Last refill - 07/20/21 #135/0

## 2021-10-26 NOTE — Telephone Encounter (Signed)
Sent. Thanks.  Unable to review [place] database- site is down.

## 2021-12-16 ENCOUNTER — Other Ambulatory Visit: Payer: Self-pay | Admitting: Family Medicine

## 2021-12-16 ENCOUNTER — Other Ambulatory Visit: Payer: Self-pay | Admitting: Gastroenterology

## 2021-12-16 NOTE — Telephone Encounter (Signed)
Mirtazapine last filled 09-16-21 #90 Lorazepam last Filled 11-17-21 Last OV 04-26-21 Next OV 05-04-22 [place]

## 2022-01-17 ENCOUNTER — Emergency Department: Payer: Medicare Other

## 2022-01-17 ENCOUNTER — Emergency Department
Admission: EM | Admit: 2022-01-17 | Discharge: 2022-01-17 | Disposition: A | Discharge status: Home / Self Care | Payer: Medicare Other | Attending: Emergency Medicine | Admitting: Emergency Medicine

## 2022-01-17 ENCOUNTER — Other Ambulatory Visit: Payer: Self-pay

## 2022-01-17 DIAGNOSIS — S0990XA Unspecified injury of head, initial encounter: Secondary | ICD-10-CM | POA: Diagnosis not present

## 2022-01-17 DIAGNOSIS — R58 Hemorrhage, not elsewhere classified: Secondary | ICD-10-CM | POA: Diagnosis not present

## 2022-01-17 DIAGNOSIS — K5903 Drug induced constipation: Secondary | ICD-10-CM | POA: Diagnosis not present

## 2022-01-17 DIAGNOSIS — E039 Hypothyroidism, unspecified: Secondary | ICD-10-CM | POA: Diagnosis not present

## 2022-01-17 DIAGNOSIS — M79604 Pain in right leg: Secondary | ICD-10-CM | POA: Diagnosis not present

## 2022-01-17 DIAGNOSIS — W1839XA Other fall on same level, initial encounter: Secondary | ICD-10-CM | POA: Diagnosis not present

## 2022-01-17 DIAGNOSIS — I10 Essential (primary) hypertension: Secondary | ICD-10-CM | POA: Diagnosis not present

## 2022-01-17 DIAGNOSIS — S0001XA Abrasion of scalp, initial encounter: Secondary | ICD-10-CM | POA: Insufficient documentation

## 2022-01-17 DIAGNOSIS — M2578 Osteophyte, vertebrae: Secondary | ICD-10-CM | POA: Diagnosis not present

## 2022-01-17 DIAGNOSIS — R55 Syncope and collapse: Secondary | ICD-10-CM

## 2022-01-17 DIAGNOSIS — Z043 Encounter for examination and observation following other accident: Secondary | ICD-10-CM | POA: Diagnosis not present

## 2022-01-17 LAB — BASIC METABOLIC PANEL
Anion gap: 9 (ref 5–15)
BUN: 19 mg/dL (ref 8–23)
CO2: 28 mmol/L (ref 22–32)
Calcium: 9.1 mg/dL (ref 8.9–10.3)
Chloride: 99 mmol/L (ref 98–111)
Creatinine, Ser: 0.96 mg/dL (ref 0.44–1.00)
GFR, Estimated: 57 mL/min — ABNORMAL LOW (ref >60)
Glucose, Bld: 125 mg/dL — ABNORMAL HIGH (ref 70–99)
Potassium: 4.2 mmol/L (ref 3.5–5.1)
Sodium: 136 mmol/L (ref 135–145)

## 2022-01-17 LAB — CBC
HCT: 37.5 % (ref 36.0–46.0)
Hemoglobin: 12.7 g/dL (ref 12.0–15.0)
MCH: 31.3 pg (ref 26.0–34.0)
MCHC: 33.9 g/dL (ref 30.0–36.0)
MCV: 92.4 fL (ref 80.0–100.0)
Platelets: 182 10*3/uL (ref 150–400)
RBC: 4.06 MIL/uL (ref 3.87–5.11)
RDW: 12.3 % (ref 11.5–15.5)
WBC: 9.7 10*3/uL (ref 4.0–10.5)
nRBC: 0 % (ref 0.0–0.2)

## 2022-01-17 NOTE — ED Triage Notes (Signed)
Pt comes via EMS with c/o near syncopal and fell backwards. Pt went to bathroom for BM after being constipated for 3-4 days. Pt denies any LOC or blood thinners. Pt has laceration to back of head. Pt states back pain, leg pain and head pain.  Bandage in place with bleeding controlled.

## 2022-01-17 NOTE — Discharge Instructions (Signed)
Please use more MiraLAX.  Use 1 capful of this powder every day.  It is safe to use this medication twice per day.   You need to use more MiraLAX, especially if you continue to use the narcotic pain medicine  Follow-up with your PCP

## 2022-01-17 NOTE — ED Provider Notes (Signed)
[facility] Provider Note    Event Date/Time   First MD Initiated Contact with Patient 01/17/22 [phone]     (approximate)   History   Near Syncope   HPI  [name] is a 88 y.o. female who presents to the ED for evaluation of Near Syncope   I reviewed outpatient PCP visit from May 2022. Reviewed GI visit from September.  History of chronic abdominal pain, nausea and constipation.  Remains on a PPI.  Diverticulosis, HTN, hypothyroidism.  She is independently ambulatory and lives at home by herself.  Handles her own ADLs.  Patient presents to the ED after an episode of near syncope and falling this morning.  She reports continued chronic constipation.  She was sitting on the toilet, straining and trying to pass a bowel movement.  She reports passing a few small hard balls.  After this she reports standing up quickly and rushing to the phone so she did not miss a phone call, but she felt "woozy all over" and she fell backwards and struck her occiput on the ground.  Denies syncope.    Physical Exam   Triage Vital Signs: ED Triage Vitals  Enc Vitals Group     BP 01/17/22 0749 (!) 166/88     Pulse Rate 01/17/22 0749 92     Resp 01/17/22 0749 19     Temp 01/17/22 0749 (!) 97.2 F (36.2 C)     Temp src --      SpO2 01/17/22 0749 93 %     Weight --      Height --      Head Circumference --      Peak Flow --      Pain Score 01/17/22 0745 4     Pain Loc --      Pain Edu? --      Excl. in [place]? --     Most recent vital signs: Vitals:   01/17/22 0749  BP: (!) 166/88  Pulse: 92  Resp: 19  Temp: (!) 97.2 F (36.2 C)  SpO2: 93%    General: Awake, no distress.  Thin.  Pleasant and conversational in full sentences.  Ambulatory with me in the room. CV:  Good peripheral perfusion.  Resp:  Normal effort.  Abd:  No distention.  MSK:  No deformity noted.  Superficial abrasion to the occiput without discrete laceration.  No bleeding. Neuro:  No  focal deficits appreciated. Cranial nerves II through XII intact 5/5 strength and sensation in all 4 extremities Other:     ED Results / Procedures / Treatments   Labs (all labs ordered are listed, but only abnormal results are displayed) Labs Reviewed  BASIC METABOLIC PANEL - Abnormal; Notable for the following components:      Result Value   Glucose, Bld 125 (*)    GFR, Estimated 57 (*)    All other components within normal limits  CBC  URINALYSIS, ROUTINE W REFLEX MICROSCOPIC  CBG MONITORING, ED    EKG Sinus rhythm, rate of 93 bpm.  Tremulous baseline.  Normal axis and intervals seem appropriate.  No ischemic features apparent.  RADIOLOGY CT head and neck reviewed by me without evidence of acute intracranial pathology or cervical fracture, respectively.  Official radiology report(s): CT HEAD WO CONTRAST  Result Date: 01/17/2022 CLINICAL DATA:  88 year old with fall. EXAM: CT HEAD WITHOUT CONTRAST TECHNIQUE: Contiguous axial images were obtained from the base of the skull through the vertex without intravenous  contrast. RADIATION DOSE REDUCTION: This exam was performed according to the departmental dose-optimization program which includes automated exposure control, adjustment of the mA and/or kV according to patient size and/or use of iterative reconstruction technique. COMPARISON:  Head CT 01/18/2009 FINDINGS: Brain: Chronic calcifications along the right posterior corpus callosum. No evidence for acute hemorrhage, mass lesion, midline shift, hydrocephalus or large infarct. Vascular: No hyperdense vessel or unexpected calcification. Skull: Normal. Negative for fracture or focal lesion. Sinuses/Orbits: No acute finding. Other: None. IMPRESSION: No acute intracranial abnormality. Electronically Signed   By: Markus Daft M.D.   On: 01/17/2022 08:13   CT Cervical Spine Wo Contrast  Result Date: 01/17/2022 CLINICAL DATA:  Near syncope and fall. EXAM: CT CERVICAL SPINE WITHOUT CONTRAST  TECHNIQUE: Multidetector CT imaging of the cervical spine was performed without intravenous contrast. Multiplanar CT image reconstructions were also generated. RADIATION DOSE REDUCTION: This exam was performed according to the departmental dose-optimization program which includes automated exposure control, adjustment of the mA and/or kV according to patient size and/or use of iterative reconstruction technique. COMPARISON:  Head CT 01/17/2022 FINDINGS: Alignment: Normal. Skull base and vertebrae: No acute fracture. No primary bone lesion or focal pathologic process. Soft tissues and spinal canal: No prevertebral fluid or swelling. No visible canal hematoma. Disc levels: Disc space narrowing at C5-C6 and C6-C7. Multilevel degenerative facet arthropathy particularly on the left side. Bilateral foraminal narrowing, left side greater than right, at C3-C4. Severe left foraminal narrowing at C4-C5. Broad-based disc osteophyte complex with bilateral foraminal narrowing at C5-C6. Right foraminal narrowing at C6-C7. Upper chest: Scarring at both lung apices. Negative for pneumothorax. Other: None IMPRESSION: 1. No acute bone abnormality in the cervical spine. 2. Multilevel degenerative disease with areas of foraminal narrowing as described. Electronically Signed   By: Markus Daft M.D.   On: 01/17/2022 08:24    PROCEDURES and INTERVENTIONS:  Procedures  Medications - No data to display   IMPRESSION / MDM / [place] / ED COURSE  I reviewed the triage vital signs and the nursing notes.  Functional 88 year old woman presents to the ED after an episode of near syncope and fall, likely a vasovagal reaction in the setting of straining and rapidly standing from the toilet, ultimately suitable for outpatient management.  She looks clinically well to me without evidence of neurologic or vascular deficits.  Small superficial abrasion to the occiput without clear laceration or need for repair.  No further signs of  trauma.  Blood work is benign without evidence of anemia or renal dysfunction or electrolyte derangements.  CT head and neck without intracranial pathology or cervical trauma.  EKG without concerning interval changes.  We discussed appropriate dosing for MiraLAX, safe position changes and return precautions for the ED.  She is suitable for outpatient management.      FINAL CLINICAL IMPRESSION(S) / ED DIAGNOSES   Final diagnoses:  Near syncope  Drug-induced constipation     Rx / DC Orders   ED Discharge Orders     None        Note:  This document was prepared using Dragon voice recognition software and may include unintentional dictation errors.   Vladimir Crofts, MD 01/17/22 [phone]

## 2022-01-24 ENCOUNTER — Other Ambulatory Visit: Payer: Self-pay

## 2022-01-24 ENCOUNTER — Ambulatory Visit (INDEPENDENT_AMBULATORY_CARE_PROVIDER_SITE_OTHER): Payer: Medicare Other | Admitting: Family

## 2022-01-24 ENCOUNTER — Encounter: Payer: Self-pay | Admitting: Family

## 2022-01-24 VITALS — BP 132/84 | HR 105 | Temp 98.2°F | Ht 62.0 in | Wt 90.1 lb

## 2022-01-24 DIAGNOSIS — S0990XA Unspecified injury of head, initial encounter: Secondary | ICD-10-CM

## 2022-01-24 DIAGNOSIS — R300 Dysuria: Secondary | ICD-10-CM

## 2022-01-24 LAB — POC URINALSYSI DIPSTICK (AUTOMATED)
Bilirubin, UA: NEGATIVE
Blood, UA: NEGATIVE
Glucose, UA: NEGATIVE
Ketones, UA: NEGATIVE
Leukocytes, UA: NEGATIVE
Nitrite, UA: NEGATIVE
Protein, UA: NEGATIVE
Spec Grav, UA: 1.01 (ref 1.010–1.025)
Urobilinogen, UA: 0.2 E.U./dL
pH, UA: 6 (ref 5.0–8.0)

## 2022-01-24 NOTE — Patient Instructions (Addendum)
Waiting for the results of your urinary tract infection, if there is an infection I will send medication.   It was a pleasure seeing you today! Please do not hesitate to reach out with any questions and or concerns.  Regards,   Eugenia Pancoast FNP-C

## 2022-01-24 NOTE — Progress Notes (Signed)
Established Patient Office Visit  Subjective:  Patient ID: [name], female    DOB: [birth year]  Age: 88 y.o. MRN: [id number]  CC:  Chief Complaint  Patient presents with   Urinary Tract Infection    Patient has been having a lot of burning in her kidney area. Patient also had a fall about a week ago and hit her head. She has been having some pain in her head; was seen at ED for this.     HPI REMINGTYN DEPAOLA is here today with concerns.   C/o dysuria, urinary retention, and urgency. She states has to drink extra tea and water to get more water out. Hard to start a stream of urine. This has been for the two weeks or so.   Last GFR 57 Lab Results  Component Value Date   CREATININE 0.96 01/17/2022   BUN 19 01/17/2022   NA 136 01/17/2022   K 4.2 01/17/2022   CL 99 01/17/2022   CO2 28 01/17/2022    Went to [facility] ER 01/17/22 for near syncopal episode.  "She reports continued chronic constipation.  She was sitting on the toilet, straining and trying to pass a bowel movement.  She reports passing a few small hard balls.  After this she reports standing up quickly and rushing to the phone so she did not miss a phone call, but she felt "woozy all over" and she fell backwards and struck her occiput on the ground.  Denies syncope"   CT head without contrast 01/17/22: FINDINGS: Brain: Chronic calcifications along the right posterior corpus callosum. No evidence for acute hemorrhage, mass lesion, midline shift, hydrocephalus or large infarct. Vascular: No hyperdense vessel or unexpected calcification. Skull: Normal. Negative for fracture or focal lesion. Sinuses/Orbits: No acute finding. Other: None. IMPRESSION: No acute intracranial abnormality.  Did have a slight headache this morning which has since resolved since she took her pain pill.  Denies blurry vision. No nausea or vomiting. Not feeling confused.   Past Medical History:  Diagnosis Date    Anxiety    Barrett esophagus    Basal cell carcinoma    Diplopia    history of,hospital 2/26-2/27/10   Diverticulitis    colonoscopy 1995.  CT 01/31/09, 02/02/09   Hard of hearing    Hiatal hernia    Hiatal hernia    EGD 1998, 2007 ( Dr. Sharlett Iles)   Hyperlipemia    Hypertension    Hypothyroidism    Osteoporosis DXA 2015   Personal history of colonic polyps    colonoscpy, removed 1995   Thyroid carcinoma ([facility]) 01/1994   UTI (urinary tract infection)    Chronic    Past Surgical History:  Procedure Laterality Date   ABDOMINAL HYSTERECTOMY     CHOLECYSTECTOMY     HIP SURGERY  08/1993   due to fracture of right hip   SKIN CANCER EXCISION     basal cell left hand, forehead   THYROIDECTOMY  04/1994   thyroid cancer    Family History  Problem Relation Age of Onset   Cancer Brother        Pharyngeal    Heart disease Brother        MI   Diabetes Brother    Diabetes Sister        amputation   Hypertension Mother    Stroke Father    Kidney cancer Neg Hx    Bladder Cancer Neg Hx     Social  History   Socioeconomic History   Marital status: Widowed    Spouse name: Not on file   Number of children: 0   Years of education: Not on file   Highest education level: Not on file  Occupational History   Occupation: retired    Comment: [employer] closed 1995  Tobacco Use   Smoking status: Never   Smokeless tobacco: Never  Vaping Use   Vaping Use: Never used  Substance and Sexual Activity   Alcohol use: No    Alcohol/week: 0.0 standard drinks   Drug use: No   Sexual activity: Not on file  Other Topics Concern   Not on file  Social History Narrative   Widowed.  Final sib died in 02/14/2013   1 step son out of state, in [place] alone, drives as of [id number]   Social Determinants of Health   Financial Resource Strain: Low Risk    Difficulty of Paying Living Expenses: Not hard at all  Food Insecurity: No Food Insecurity   Worried About Charity fundraiser in the  Last Year: Never true   Arboriculturist in the Last Year: Never true  Transportation Needs: No Transportation Needs   Lack of Transportation (Medical): No   Lack of Transportation (Non-Medical): No  Physical Activity: Inactive   Days of Exercise per Week: 0 days   Minutes of Exercise per Session: 0 min  Stress: Stress Concern Present   Feeling of Stress : To some extent  Social Connections: Not on file  Intimate Partner Violence: Not At Risk   Fear of Current or Ex-Partner: No   Emotionally Abused: No   Physically Abused: No   Sexually Abused: No    Outpatient Medications Prior to Visit  Medication Sig Dispense Refill   Artificial Saliva (BIOTENE DRY MOUTH MOISTURIZING) SOLN Use as directed 5 mLs in the mouth or throat daily as needed (For dry mouth).      ENSURE PLUS (ENSURE PLUS) LIQD Take 237 mLs by mouth 2 (two) times daily between meals.      HYDROcodone-acetaminophen (NORCO/VICODIN) 5-325 MG tablet TAKE 1/2 TABLET IN THE MORNING AND 1 TABLET BY MOUTH IN THE EVENING 135 tablet 0   hyoscyamine (LEVSIN) 0.125 MG tablet TAKE 1 TABLET BY MOUTH 3 TIMES DAILY AS NEEDED 90 tablet 3   levothyroxine (SYNTHROID) 88 MCG tablet TAKE 1 TAB BY MOUTH ONCE DAILY. TAKE ON AN EMPTY STOMACH WITH A GLASS OF WATER ATLEAST 30-60 MINUTES BEFORE BREAKFAST 90 tablet 1   lisinopril (ZESTRIL) 10 MG tablet TAKE 1 TABLET BY MOUTH DAILY AT BEDTIME 90 tablet 2   loratadine (CLARITIN REDITABS) 10 MG dissolvable tablet Take 10 mg by mouth daily as needed for allergies.     LORazepam (ATIVAN) 0.5 MG tablet TAKE 1/2 TO 1 TABLET BY MOUTH 2 TIMES DAILY AS NEEDED FOR ANXIETY. 40 tablet 1   mirtazapine (REMERON) 15 MG tablet TAKE 1 TABLET BY MOUTH EVERY NIGHT AT BEDTIME 90 tablet 3   omeprazole (PRILOSEC) 20 MG capsule TAKE 1 CAPSULE BY MOUTH ONCE DAILY 90 capsule 1   ondansetron (ZOFRAN-ODT) 4 MG disintegrating tablet TAKE 1 TABLET BY MOUTH TWICE A DAY 60 tablet 1   vitamin B-12 (CYANOCOBALAMIN) 500 MCG tablet Take  500 mcg by mouth daily.     No facility-administered medications prior to visit.    Allergies  Allergen Reactions   Bentyl [Dicyclomine Hcl] Other (See Comments)    abd pain  Ciprofloxacin     Can cause GI upset, esp if taken with metamucil, o/w it is tolerated   Cortisone Acetate     REACTION: nausea   Creon [Pancrelipase (Lip-Prot-Amyl)]     Abd pain and nausea.   Doxycycline Other (See Comments)    GI upset   Gabapentin     REACTION: severe dizziness   Metoclopramide Hcl    Miralax [Polyethylene Glycol]     abd discomfort   Penicillins     Has patient had a PCN reaction causing immediate rash, facial/tongue/throat swelling, SOB or lightheadedness with hypotension: yes Has patient had a PCN reaction causing severe rash involving mucus membranes or skin necrosis: yes Has patient had a PCN reaction that required hospitalization no Has patient had a PCN reaction occurring within the last 10 years: no If all of the above answers are "NO", then may proceed with Cephalosporin use.    Promethazine Hcl     REACTION: unspecified   Remeron [Mirtazapine] Other (See Comments)    Tolerates 15 mg/day but does not tolerate 30 mg.  Felt jittery with 30 mg dose.   Septra [Sulfamethoxazole-Trimethoprim] Other (See Comments)    GI upset but not allergy    ROS Review of Systems  Constitutional:  Negative for chills and fever.  Eyes:  Negative for visual disturbance.  Respiratory:  Negative for shortness of breath.   Cardiovascular:  Negative for chest pain, palpitations and leg swelling.  Gastrointestinal:  Negative for abdominal pain, nausea and vomiting.  Genitourinary:  Positive for difficulty urinating (urinary retnetion), dysuria, frequency and urgency. Negative for flank pain, hematuria, pelvic pain and vaginal discharge.  Musculoskeletal:  Negative for arthralgias.  Neurological:  Positive for headaches (this am resolved after taking her pain medication). Negative for dizziness,  tremors, seizures, weakness, light-headedness and numbness.     Objective:    Physical Exam Vitals reviewed.  Constitutional:      General: She is not in acute distress.    Appearance: Normal appearance. She is normal weight. She is not ill-appearing, toxic-appearing or diaphoretic.  HENT:     Right Ear: Tympanic membrane normal.     Left Ear: Tympanic membrane normal.     Mouth/Throat:     Mouth: Mucous membranes are moist.     Pharynx: No pharyngeal swelling.     Tonsils: No tonsillar exudate.  Eyes:     Extraocular Movements: Extraocular movements intact.     Conjunctiva/sclera: Conjunctivae normal.     Pupils: Pupils are equal, round, and reactive to light.  Neck:     Thyroid: No thyroid mass.  Cardiovascular:     Rate and Rhythm: Normal rate and regular rhythm.  Pulmonary:     Effort: Pulmonary effort is normal.     Breath sounds: Normal breath sounds.  Abdominal:     General: Abdomen is flat. Bowel sounds are normal.     Palpations: Abdomen is soft.  Musculoskeletal:        General: Normal range of motion.  Lymphadenopathy:     Cervical:     Right cervical: No superficial cervical adenopathy.    Left cervical: No superficial cervical adenopathy.  Skin:    General: Skin is warm.     Capillary Refill: Capillary refill takes less than 2 seconds.  Neurological:     General: No focal deficit present.     Mental Status: She is alert and oriented to person, place, and time.  Psychiatric:  Mood and Affect: Mood normal.        Behavior: Behavior normal.        Thought Content: Thought content normal.        Judgment: Judgment normal.    Small raised healing scabbed over wound, closed, no drainage. Not warm to touch. No erythema. Nontender to touch.    BP 132/84 (BP Location: Left Arm, Patient Position: Sitting, Cuff Size: Normal)    Pulse (!) 105    Temp 98.2 F (36.8 C) (Temporal)    Ht 5\' 2"  (1.575 m)    Wt 90 lb 1.6 oz (40.9 kg)    LMP 03/28/1989    SpO2  95%    BMI 16.48 kg/m  Wt Readings from Last 3 Encounters:  01/24/22 90 lb 1.6 oz (40.9 kg)  01/17/22 85 lb 15.7 oz (39 kg)  08/25/21 86 lb (39 kg)     Health Maintenance Due  Topic Date Due   Zoster Vaccines- Shingrix (1 of 2) Never done   Pneumonia Vaccine 52+ Years old (2 - PCV) 05/20/2013   COVID-19 Vaccine (3 - Pfizer risk series) 02/26/2020   INFLUENZA VACCINE  06/27/2021    There are no preventive care reminders to display for this patient.  Lab Results  Component Value Date   TSH 2.92 09/24/2020   Lab Results  Component Value Date   WBC 9.7 01/17/2022   HGB 12.7 01/17/2022   HCT 37.5 01/17/2022   MCV 92.4 01/17/2022   PLT 182 01/17/2022   Lab Results  Component Value Date   NA 136 01/17/2022   K 4.2 01/17/2022   CO2 28 01/17/2022   GLUCOSE 125 (H) 01/17/2022   BUN 19 01/17/2022   CREATININE 0.96 01/17/2022   BILITOT 0.8 04/14/2021   ALKPHOS 70 04/14/2021   AST 20 04/14/2021   ALT 11 04/14/2021   PROT 7.7 04/14/2021   ALBUMIN 4.4 04/14/2021   CALCIUM 9.1 01/17/2022   ANIONGAP 9 01/17/2022   GFR 66.66 04/14/2021   Lab Results  Component Value Date   HGBA1C 5.6 10/28/2019      Assessment & Plan:   Problem List Items Addressed This Visit       Other   Dysuria - Primary    Choosing to hold to treat, pending urine culture results as pt with multiple allergies. Pt also agrees to plan as 'she doesn't want an antbx that she doesn't need'      Relevant Orders   POCT Urinalysis Dipstick (Automated) (Completed)   Urine Culture (Completed)   Head trauma, initial encounter    Reviewed ER notes.  Wound is healed over, scabbing, and not appearing to be infected. Pt is AOx3, doing well. Advised to monitor for AMs, increasing headache, lethargy and notify us immediately or go to Er if any of these are to occur.        No orders of the defined types were placed in this encounter.   Follow-up: Return in about 2 weeks (around 02/07/2022) for with Dr.  [name] for follow up .    Eugenia Pancoast, FNP

## 2022-01-25 LAB — URINE CULTURE
MICRO NUMBER:: 13067756
SPECIMEN QUALITY:: ADEQUATE

## 2022-01-26 DIAGNOSIS — H5319 Other subjective visual disturbances: Secondary | ICD-10-CM | POA: Diagnosis not present

## 2022-01-29 ENCOUNTER — Encounter: Payer: Self-pay | Admitting: Family

## 2022-01-29 DIAGNOSIS — S0990XA Unspecified injury of head, initial encounter: Secondary | ICD-10-CM | POA: Insufficient documentation

## 2022-01-29 NOTE — Assessment & Plan Note (Signed)
Reviewed ER notes.  ?Wound is healed over, scabbing, and not appearing to be infected. Pt is AOx3, doing well. Advised to monitor for AMs, increasing headache, lethargy and notify us immediately or go to Er if any of these are to occur.  ?

## 2022-01-29 NOTE — Assessment & Plan Note (Addendum)
Choosing to hold to treat, pending urine culture results as pt with multiple allergies. Pt also agrees to plan as 'she doesn't want an antbx that she doesn't need' ?

## 2022-01-30 ENCOUNTER — Other Ambulatory Visit: Payer: Self-pay | Admitting: Family Medicine

## 2022-01-30 NOTE — Telephone Encounter (Signed)
Refill request for HYDROCODONE-APAP 5-325 MG TAB ? ?LOV - 01/24/22 ?Next OV - not scheduled ?Last refill - 10/26/21 #135/0 ? ?

## 2022-02-07 ENCOUNTER — Ambulatory Visit: Payer: Medicare Other | Admitting: Family Medicine

## 2022-02-20 ENCOUNTER — Other Ambulatory Visit: Payer: Self-pay | Admitting: Gastroenterology

## 2022-02-21 NOTE — Telephone Encounter (Signed)
LM for patient to return call.

## 2022-02-22 NOTE — Telephone Encounter (Signed)
Spoke to patient, sx are not getting worse. I let her know we will send in a refill, but if her sx get worse then she needs to call us right away so we can schedule her a follow up. ?Patient expressed understanding and agreement. ?

## 2022-02-22 NOTE — Telephone Encounter (Signed)
Patient is returning your call.  

## 2022-03-13 ENCOUNTER — Other Ambulatory Visit: Payer: Self-pay | Admitting: Family Medicine

## 2022-03-13 NOTE — Telephone Encounter (Signed)
Refill request for LORAZEPAM 0.5 MG TAB ? ?LOV - 01/24/22 ?Next OV - not scheduled ?Last refill - 12/18/21 #40/1 ? ?Also needs lisinopril refilled ? ?

## 2022-04-11 ENCOUNTER — Ambulatory Visit (INDEPENDENT_AMBULATORY_CARE_PROVIDER_SITE_OTHER): Payer: Medicare Other | Admitting: Family Medicine

## 2022-04-11 ENCOUNTER — Encounter: Payer: Self-pay | Admitting: Family Medicine

## 2022-04-11 ENCOUNTER — Ambulatory Visit (INDEPENDENT_AMBULATORY_CARE_PROVIDER_SITE_OTHER)
Admission: RE | Admit: 2022-04-11 | Discharge: 2022-04-11 | Disposition: A | Discharge status: Home / Self Care | Payer: Medicare Other | Source: Ambulatory Visit | Attending: Family Medicine | Admitting: Family Medicine

## 2022-04-11 VITALS — BP 126/76 | HR 110 | Temp 97.4°F | Ht 62.0 in | Wt 87.9 lb

## 2022-04-11 DIAGNOSIS — R062 Wheezing: Secondary | ICD-10-CM | POA: Diagnosis not present

## 2022-04-11 DIAGNOSIS — E039 Hypothyroidism, unspecified: Secondary | ICD-10-CM

## 2022-04-11 DIAGNOSIS — R0609 Other forms of dyspnea: Secondary | ICD-10-CM

## 2022-04-11 DIAGNOSIS — M25473 Effusion, unspecified ankle: Secondary | ICD-10-CM | POA: Insufficient documentation

## 2022-04-11 DIAGNOSIS — I10 Essential (primary) hypertension: Secondary | ICD-10-CM | POA: Diagnosis not present

## 2022-04-11 LAB — COMPREHENSIVE METABOLIC PANEL
ALT: 10 U/L (ref 0–35)
AST: 17 U/L (ref 0–37)
Albumin: 4.2 g/dL (ref 3.5–5.2)
Alkaline Phosphatase: 86 U/L (ref 39–117)
BUN: 12 mg/dL (ref 6–23)
CO2: 33 mEq/L — ABNORMAL HIGH (ref 19–32)
Calcium: 9.4 mg/dL (ref 8.4–10.5)
Chloride: 99 mEq/L (ref 96–112)
Creatinine, Ser: 0.87 mg/dL (ref 0.40–1.20)
GFR: 58.96 mL/min — ABNORMAL LOW (ref >60.00)
Glucose, Bld: 96 mg/dL (ref 70–99)
Potassium: 4.3 mEq/L (ref 3.5–5.1)
Sodium: 140 mEq/L (ref 135–145)
Total Bilirubin: 0.9 mg/dL (ref 0.2–1.2)
Total Protein: 7.4 g/dL (ref 6.0–8.3)

## 2022-04-11 LAB — BRAIN NATRIURETIC PEPTIDE: Pro B Natriuretic peptide (BNP): 46 pg/mL (ref 0.0–100.0)

## 2022-04-11 LAB — TSH: TSH: 1.11 u[IU]/mL (ref 0.35–5.50)

## 2022-04-11 NOTE — Progress Notes (Signed)
? ?Subjective:  ? ?  ?[name] is a 88 y.o. female presenting for Foot Swelling (Bilateral x 1 week) ?  ? ? ?HPI ? ?#Ankle swelling ?- started swelling 1 week ago ?- both ankles swelling ?- hard time putting shoes on ?- ambulates with cane ?- during the day does house work ?- has been elevating her legs with some improvement ?- has gotten winded - with walking - off and on for the last few months ?- appetite has been suppressed ?- lays on her right side at night - no SOB at nighttime ?- no PND ? ? ?Review of Systems ? ? ?Social History  ? ?Tobacco Use  ?Smoking Status Never  ?Smokeless Tobacco Never  ? ? ? ?   ?Objective:  ?  ?BP Readings from Last 3 Encounters:  ?04/11/22 126/76  ?01/24/22 132/84  ?01/17/22 (!) 166/88  ? ?Wt Readings from Last 3 Encounters:  ?04/11/22 87 lb 14.4 oz (39.9 kg)  ?01/24/22 90 lb 1.6 oz (40.9 kg)  ?01/17/22 85 lb 15.7 oz (39 kg)  ? ? ?BP 126/76 (BP Location: Left Arm, Patient Position: Sitting, Cuff Size: Normal)   Pulse (!) 110   Temp (!) 97.4 ?F (36.3 ?C) (Temporal)   Ht '5\' 2"'$  (1.575 m)   Wt 87 lb 14.4 oz (39.9 kg)   LMP 03/28/1989   SpO2 96%   BMI 16.08 kg/m?  ? ? ?Physical Exam ?Constitutional:   ?   General: She is not in acute distress. ?   Appearance: She is well-developed. She is not diaphoretic.  ?   Comments: thin  ?HENT:  ?   Right Ear: External ear normal.  ?   Left Ear: External ear normal.  ?   Nose: Nose normal.  ?Eyes:  ?   Conjunctiva/sclera: Conjunctivae normal.  ?Cardiovascular:  ?   Rate and Rhythm: Normal rate and regular rhythm.  ?   Heart sounds: No murmur heard. ?Pulmonary:  ?   Effort: Pulmonary effort is normal.  ?   Breath sounds: Wheezing and rales present.  ?Musculoskeletal:  ?   Cervical back: Neck supple.  ?   Comments: B/l ankle swelling 2-3+  ?Skin: ?   General: Skin is warm and dry.  ?   Capillary Refill: Capillary refill takes less than 2 seconds.  ?Neurological:  ?   Mental Status: She is alert. Mental status is at baseline.   ?Psychiatric:     ?   Mood and Affect: Mood normal.     ?   Behavior: Behavior normal.  ? ? ? ?CXR (my read): no obvious effusion or edema, some scarring, kyphosis and curved spine, hyperinflation  ? ?   ?Assessment & Plan:  ? ?Problem List Items Addressed This Visit   ? ?  ? Cardiovascular and Mediastinum  ? Essential hypertension  ?  Blood pressure controlled, continue lisinopril 10 mg.  Not on amlodipine. ? ?  ?  ?  ? Endocrine  ? Hypothyroidism  ?  She feels this is stable, has been longer than a year since last TSH was checked.  We will get with labs today.  Continue levothyroxine 88 mcg. ? ?  ?  ? Relevant Orders  ? TSH  ?  ? Other  ? Ankle swelling  ?  B/l making DVT less likely. No trauma to suggest MSK pathology. Concerning for cardiac. In tolerant to compression socks. Will get BNP to evaluate for possible CHF. Advised pcp f/u if not improving  or sooner pending labs. Discussed would ideally avoid diuretic due to risk for side effects.  ? ?  ?  ? Relevant Orders  ? Comprehensive metabolic panel  ? Brain natriuretic peptide  ? DOE (dyspnea on exertion) - Primary  ?  Lungs with scattered wheezes as well as some rails.  She does have a history of an abnormal CT chest.  X-ray today without any clear sign of acute finding we will follow-up final read.  No smoking history.  Evaluate for possible heart failure though no signs of edema or effusion on x-ray. ? ?  ?  ? Relevant Orders  ? Comprehensive metabolic panel  ? Brain natriuretic peptide  ? ?Other Visit Diagnoses   ? ? Wheezing      ? Relevant Orders  ? DG Chest 2 View  ? ?  ? ? ? ?Return in about 2 weeks (around 04/25/2022) for with Dr. [name]. ? ?Lesleigh Noe, MD ? ? ? ?

## 2022-04-11 NOTE — Assessment & Plan Note (Signed)
She feels this is stable, has been longer than a year since last TSH was checked.  We will get with labs today.  Continue levothyroxine 88 mcg. ?

## 2022-04-11 NOTE — Assessment & Plan Note (Signed)
Lungs with scattered wheezes as well as some rails.  She does have a history of an abnormal CT chest.  X-ray today without any clear sign of acute finding we will follow-up final read.  No smoking history.  Evaluate for possible heart failure though no signs of edema or effusion on x-ray. ?

## 2022-04-11 NOTE — Patient Instructions (Signed)
Ankle swelling ?- would recommend compression socks ?- labs and Chest x-ray today ?- Continue elevate the legs ?

## 2022-04-11 NOTE — Assessment & Plan Note (Signed)
B/l making DVT less likely. No trauma to suggest MSK pathology. Concerning for cardiac. In tolerant to compression socks. Will get BNP to evaluate for possible CHF. Advised pcp f/u if not improving or sooner pending labs. Discussed would ideally avoid diuretic due to risk for side effects.  ?

## 2022-04-11 NOTE — Assessment & Plan Note (Signed)
Blood pressure controlled, continue lisinopril 10 mg.  Not on amlodipine. ?

## 2022-04-27 ENCOUNTER — Other Ambulatory Visit: Payer: Self-pay | Admitting: Family Medicine

## 2022-04-27 ENCOUNTER — Other Ambulatory Visit: Payer: Self-pay | Admitting: Nurse Practitioner

## 2022-05-04 ENCOUNTER — Ambulatory Visit: Payer: Medicare Other

## 2022-05-11 ENCOUNTER — Telehealth: Payer: Self-pay

## 2022-05-11 NOTE — Telephone Encounter (Signed)
[name] is calling in stating that the patient passed on June 03, 2023. Still waiting on death certificate to be signed from [place].

## 2022-05-11 NOTE — Telephone Encounter (Signed)
Called the funeral home and they only information they had was that patient passed away in her home several days ago. She was found already deceased. The only contact they have on file is her nephew who is handling everything and his name is Chip [phone]. Since there is no DPR on file I am not sure how it works with [name] being able to talk to him.

## 2022-05-11 NOTE — Telephone Encounter (Signed)
Called Chip [facility], couldn't LMOVM.

## 2022-05-11 NOTE — Telephone Encounter (Signed)
Do they have any information about the circumstances of her death?  Please let me know.  I don't have any information.  Thanks.

## 2022-05-13 NOTE — Telephone Encounter (Addendum)
Called Chip Richey, couldn't LMOVM.   I have completed her death certificate to the best of my ability.  I was glad to see this kind lady here in clinic and she will be missed.   Please update her chart and notify the funeral home.  Thanks.

## 2022-05-15 NOTE — Telephone Encounter (Signed)
Claiborne Billings at [facility] home notified as instructed by telephone. Will send to front office supervisor to make sure chart is updated per Dr. [name].

## 2022-05-27 DEATH — deceased
